# Patient Record
Sex: Female | Born: 1985 | Race: White | Hispanic: No | Smoking: Current some day smoker
Health system: Southern US, Community
[De-identification: ages and names within clinical notes are randomized; demographics above are authoritative.]

## PROBLEM LIST (undated history)

## (undated) ENCOUNTER — Emergency Department (HOSPITAL_COMMUNITY): Discharge status: Against Medical Advice | Payer: MEDICAID

## (undated) DIAGNOSIS — F1910 Other psychoactive substance abuse, uncomplicated: Secondary | ICD-10-CM

## (undated) DIAGNOSIS — R569 Unspecified convulsions: Secondary | ICD-10-CM

## (undated) DIAGNOSIS — F419 Anxiety disorder, unspecified: Secondary | ICD-10-CM

## (undated) DIAGNOSIS — F1120 Opioid dependence, uncomplicated: Secondary | ICD-10-CM

## (undated) DIAGNOSIS — G8929 Other chronic pain: Secondary | ICD-10-CM

## (undated) DIAGNOSIS — F32A Depression, unspecified: Secondary | ICD-10-CM

## (undated) DIAGNOSIS — M1990 Unspecified osteoarthritis, unspecified site: Secondary | ICD-10-CM

## (undated) DIAGNOSIS — F329 Major depressive disorder, single episode, unspecified: Secondary | ICD-10-CM

## (undated) DIAGNOSIS — K219 Gastro-esophageal reflux disease without esophagitis: Secondary | ICD-10-CM

## (undated) HISTORY — PX: MANDIBLE FRACTURE SURGERY: SHX706

## (undated) HISTORY — DX: Unspecified osteoarthritis, unspecified site: M19.90

## (undated) HISTORY — DX: Depression, unspecified: F32.A

## (undated) HISTORY — DX: Unspecified convulsions: R56.9

## (undated) HISTORY — PX: APPENDECTOMY: SHX54

## (undated) HISTORY — DX: Major depressive disorder, single episode, unspecified: F32.9

## (undated) HISTORY — DX: Gastro-esophageal reflux disease without esophagitis: K21.9

---

## 2004-08-09 ENCOUNTER — Emergency Department (HOSPITAL_COMMUNITY): Admission: EM | Admit: 2004-08-09 | Discharge: 2004-08-09 | Payer: Self-pay | Admitting: Emergency Medicine

## 2004-11-01 ENCOUNTER — Emergency Department (HOSPITAL_COMMUNITY): Admission: EM | Admit: 2004-11-01 | Discharge: 2004-11-01 | Payer: Self-pay | Admitting: Emergency Medicine

## 2004-11-07 ENCOUNTER — Emergency Department (HOSPITAL_COMMUNITY): Admission: EM | Admit: 2004-11-07 | Discharge: 2004-11-07 | Payer: Self-pay | Admitting: Emergency Medicine

## 2005-03-24 ENCOUNTER — Emergency Department (HOSPITAL_COMMUNITY): Admission: EM | Admit: 2005-03-24 | Discharge: 2005-03-24 | Payer: Self-pay | Admitting: Emergency Medicine

## 2005-04-01 ENCOUNTER — Emergency Department (HOSPITAL_COMMUNITY): Admission: EM | Admit: 2005-04-01 | Discharge: 2005-04-01 | Payer: Self-pay | Admitting: Emergency Medicine

## 2005-04-02 ENCOUNTER — Emergency Department (HOSPITAL_COMMUNITY): Admission: EM | Admit: 2005-04-02 | Discharge: 2005-04-02 | Payer: Self-pay | Admitting: Emergency Medicine

## 2005-05-23 ENCOUNTER — Emergency Department (HOSPITAL_COMMUNITY): Admission: EM | Admit: 2005-05-23 | Discharge: 2005-05-23 | Payer: Self-pay | Admitting: Emergency Medicine

## 2005-11-25 ENCOUNTER — Inpatient Hospital Stay (HOSPITAL_COMMUNITY): Admission: EM | Admit: 2005-11-25 | Discharge: 2005-11-28 | Payer: Self-pay | Admitting: Emergency Medicine

## 2005-11-29 ENCOUNTER — Emergency Department (HOSPITAL_COMMUNITY): Admission: EM | Admit: 2005-11-29 | Discharge: 2005-11-29 | Payer: Self-pay | Admitting: Emergency Medicine

## 2005-12-03 ENCOUNTER — Emergency Department (HOSPITAL_COMMUNITY): Admission: EM | Admit: 2005-12-03 | Discharge: 2005-12-03 | Payer: Self-pay | Admitting: Emergency Medicine

## 2006-04-21 ENCOUNTER — Emergency Department (HOSPITAL_COMMUNITY): Admission: EM | Admit: 2006-04-21 | Discharge: 2006-04-21 | Payer: Self-pay | Admitting: Emergency Medicine

## 2006-04-27 ENCOUNTER — Emergency Department (HOSPITAL_COMMUNITY): Admission: EM | Admit: 2006-04-27 | Discharge: 2006-04-27 | Payer: Self-pay | Admitting: Emergency Medicine

## 2006-05-05 ENCOUNTER — Emergency Department (HOSPITAL_COMMUNITY): Admission: EM | Admit: 2006-05-05 | Discharge: 2006-05-05 | Payer: Self-pay | Admitting: Emergency Medicine

## 2006-06-06 ENCOUNTER — Emergency Department (HOSPITAL_COMMUNITY): Admission: EM | Admit: 2006-06-06 | Discharge: 2006-06-06 | Payer: Self-pay | Admitting: Emergency Medicine

## 2006-06-27 ENCOUNTER — Emergency Department (HOSPITAL_COMMUNITY): Admission: EM | Admit: 2006-06-27 | Discharge: 2006-06-27 | Payer: Self-pay | Admitting: Family Medicine

## 2006-07-16 ENCOUNTER — Emergency Department (HOSPITAL_COMMUNITY): Admission: EM | Admit: 2006-07-16 | Discharge: 2006-07-17 | Payer: Self-pay | Admitting: Emergency Medicine

## 2006-09-10 ENCOUNTER — Emergency Department (HOSPITAL_COMMUNITY): Admission: EM | Admit: 2006-09-10 | Discharge: 2006-09-10 | Payer: Self-pay | Admitting: Emergency Medicine

## 2007-01-08 ENCOUNTER — Emergency Department (HOSPITAL_COMMUNITY): Admission: EM | Admit: 2007-01-08 | Discharge: 2007-01-08 | Payer: Self-pay | Admitting: Family Medicine

## 2007-02-20 ENCOUNTER — Emergency Department (HOSPITAL_COMMUNITY): Admission: EM | Admit: 2007-02-20 | Discharge: 2007-02-20 | Payer: Self-pay | Admitting: Emergency Medicine

## 2007-02-27 ENCOUNTER — Emergency Department (HOSPITAL_COMMUNITY): Admission: EM | Admit: 2007-02-27 | Discharge: 2007-02-27 | Payer: Self-pay | Admitting: Emergency Medicine

## 2007-03-30 ENCOUNTER — Emergency Department (HOSPITAL_COMMUNITY): Admission: EM | Admit: 2007-03-30 | Discharge: 2007-03-30 | Payer: Self-pay | Admitting: Emergency Medicine

## 2007-06-07 ENCOUNTER — Emergency Department (HOSPITAL_COMMUNITY): Admission: EM | Admit: 2007-06-07 | Discharge: 2007-06-07 | Payer: Self-pay | Admitting: Pediatrics

## 2007-07-19 ENCOUNTER — Emergency Department (HOSPITAL_COMMUNITY): Admission: EM | Admit: 2007-07-19 | Discharge: 2007-07-19 | Payer: Self-pay | Admitting: Emergency Medicine

## 2007-09-08 ENCOUNTER — Inpatient Hospital Stay (HOSPITAL_COMMUNITY): Admission: EM | Admit: 2007-09-08 | Discharge: 2007-09-11 | Payer: Self-pay | Admitting: Family Medicine

## 2007-10-15 ENCOUNTER — Inpatient Hospital Stay (HOSPITAL_COMMUNITY): Admission: AD | Admit: 2007-10-15 | Discharge: 2007-10-15 | Payer: Self-pay | Admitting: Gynecology

## 2008-03-14 ENCOUNTER — Inpatient Hospital Stay (HOSPITAL_COMMUNITY): Admission: AD | Admit: 2008-03-14 | Discharge: 2008-03-14 | Payer: Self-pay | Admitting: Obstetrics & Gynecology

## 2008-06-12 ENCOUNTER — Ambulatory Visit: Payer: Self-pay | Admitting: Obstetrics and Gynecology

## 2008-06-12 ENCOUNTER — Inpatient Hospital Stay (HOSPITAL_COMMUNITY): Admission: AD | Admit: 2008-06-12 | Discharge: 2008-06-13 | Payer: Self-pay | Admitting: Obstetrics & Gynecology

## 2008-06-28 ENCOUNTER — Ambulatory Visit: Payer: Self-pay | Admitting: Obstetrics & Gynecology

## 2008-06-28 ENCOUNTER — Encounter: Payer: Self-pay | Admitting: Obstetrics and Gynecology

## 2008-07-13 ENCOUNTER — Ambulatory Visit: Payer: Self-pay | Admitting: Family Medicine

## 2008-07-27 ENCOUNTER — Ambulatory Visit: Payer: Self-pay | Admitting: Family Medicine

## 2008-07-27 LAB — CONVERTED CEMR LAB
Hemoglobin: 11.7 g/dL — ABNORMAL LOW (ref 12.0–15.0)
Platelets: 248 10*3/uL (ref 150–400)
RBC: 3.78 M/uL — ABNORMAL LOW (ref 3.87–5.11)
RDW: 13.1 % (ref 11.5–15.5)

## 2008-07-28 ENCOUNTER — Ambulatory Visit (HOSPITAL_COMMUNITY): Admission: RE | Admit: 2008-07-28 | Discharge: 2008-07-28 | Payer: Self-pay | Admitting: Family Medicine

## 2008-07-31 ENCOUNTER — Encounter: Admission: RE | Admit: 2008-07-31 | Discharge: 2008-07-31 | Payer: Self-pay | Admitting: Family Medicine

## 2008-08-10 ENCOUNTER — Ambulatory Visit: Payer: Self-pay | Admitting: Obstetrics & Gynecology

## 2008-08-23 ENCOUNTER — Ambulatory Visit: Payer: Self-pay | Admitting: Obstetrics & Gynecology

## 2008-08-31 ENCOUNTER — Ambulatory Visit (HOSPITAL_COMMUNITY): Admission: RE | Admit: 2008-08-31 | Discharge: 2008-08-31 | Payer: Self-pay | Admitting: Obstetrics & Gynecology

## 2008-08-31 ENCOUNTER — Ambulatory Visit: Payer: Self-pay | Admitting: Obstetrics & Gynecology

## 2008-09-12 ENCOUNTER — Ambulatory Visit: Payer: Self-pay | Admitting: Obstetrics & Gynecology

## 2008-09-18 ENCOUNTER — Ambulatory Visit: Payer: Self-pay | Admitting: Obstetrics & Gynecology

## 2008-09-18 ENCOUNTER — Encounter: Payer: Self-pay | Admitting: Obstetrics & Gynecology

## 2008-09-18 LAB — CONVERTED CEMR LAB
Chlamydia, DNA Probe: NEGATIVE
HCV Ab: REACTIVE — AB

## 2008-09-19 ENCOUNTER — Encounter: Payer: Self-pay | Admitting: Obstetrics & Gynecology

## 2008-09-25 ENCOUNTER — Ambulatory Visit: Payer: Self-pay | Admitting: Obstetrics & Gynecology

## 2008-09-25 ENCOUNTER — Encounter: Payer: Self-pay | Admitting: Obstetrics & Gynecology

## 2008-09-25 ENCOUNTER — Ambulatory Visit (HOSPITAL_COMMUNITY): Admission: RE | Admit: 2008-09-25 | Discharge: 2008-09-25 | Payer: Self-pay | Admitting: Obstetrics & Gynecology

## 2008-09-25 LAB — CONVERTED CEMR LAB
ALT: 9 U/L (ref 0–35)
AST: 14 U/L (ref 0–37)
Albumin: 2.9 g/dL — ABNORMAL LOW (ref 3.5–5.2)
Alkaline Phosphatase: 141 U/L — ABNORMAL HIGH (ref 39–117)
BUN: 15 mg/dL (ref 6–23)
CO2: 19 meq/L (ref 19–32)
Calcium: 8.4 mg/dL (ref 8.4–10.5)
Chloride: 110 meq/L (ref 96–112)
Creatinine, Ser: 0.65 mg/dL (ref 0.40–1.20)
Glucose, Bld: 86 mg/dL (ref 70–99)
Potassium: 4.3 meq/L (ref 3.5–5.3)
Sodium: 139 meq/L (ref 135–145)
Total Bilirubin: 0.2 mg/dL — ABNORMAL LOW (ref 0.3–1.2)
Total Protein: 5.6 g/dL — ABNORMAL LOW (ref 6.0–8.3)

## 2008-09-28 ENCOUNTER — Ambulatory Visit: Payer: Self-pay | Admitting: Obstetrics & Gynecology

## 2008-10-02 ENCOUNTER — Ambulatory Visit: Payer: Self-pay | Admitting: Obstetrics & Gynecology

## 2008-10-09 ENCOUNTER — Inpatient Hospital Stay (HOSPITAL_COMMUNITY): Admission: RE | Admit: 2008-10-09 | Discharge: 2008-10-11 | Payer: Self-pay | Admitting: Obstetrics & Gynecology

## 2008-10-09 ENCOUNTER — Ambulatory Visit: Payer: Self-pay | Admitting: Obstetrics & Gynecology

## 2010-02-07 ENCOUNTER — Ambulatory Visit: Payer: Self-pay | Admitting: Gastroenterology

## 2010-04-01 ENCOUNTER — Encounter: Payer: Self-pay | Admitting: *Deleted

## 2010-06-15 LAB — CBC
HCT: 29.4 % — ABNORMAL LOW (ref 36.0–46.0)
HCT: 30.1 % — ABNORMAL LOW (ref 36.0–46.0)
Hemoglobin: 10.3 g/dL — ABNORMAL LOW (ref 12.0–15.0)
Hemoglobin: 10.6 g/dL — ABNORMAL LOW (ref 12.0–15.0)
MCHC: 35.2 g/dL (ref 30.0–36.0)
MCV: 91.3 fL (ref 78.0–100.0)
RDW: 12.7 % (ref 11.5–15.5)
RDW: 12.9 % (ref 11.5–15.5)

## 2010-06-16 LAB — POCT URINALYSIS DIP (DEVICE)
Bilirubin Urine: NEGATIVE
Glucose, UA: NEGATIVE mg/dL
Nitrite: NEGATIVE
Nitrite: NEGATIVE
Protein, ur: 30 mg/dL — AB
Protein, ur: NEGATIVE mg/dL
Protein, ur: NEGATIVE mg/dL
Specific Gravity, Urine: 1.02 (ref 1.005–1.030)
Urobilinogen, UA: 0.2 mg/dL (ref 0.0–1.0)
Urobilinogen, UA: 0.2 mg/dL (ref 0.0–1.0)
pH: 5.5 (ref 5.0–8.0)

## 2010-06-16 LAB — CBC
Platelets: 204 10*3/uL (ref 150–400)
WBC: 7.4 10*3/uL (ref 4.0–10.5)

## 2010-06-17 LAB — POCT URINALYSIS DIP (DEVICE)
Glucose, UA: NEGATIVE mg/dL
Hgb urine dipstick: NEGATIVE
Ketones, ur: NEGATIVE mg/dL
Nitrite: NEGATIVE
Nitrite: NEGATIVE
Protein, ur: 30 mg/dL — AB
Protein, ur: 30 mg/dL — AB
Protein, ur: NEGATIVE mg/dL
Specific Gravity, Urine: 1.02 (ref 1.005–1.030)
Urobilinogen, UA: 0.2 mg/dL (ref 0.0–1.0)
Urobilinogen, UA: 0.2 mg/dL (ref 0.0–1.0)
pH: 5.5 (ref 5.0–8.0)
pH: 6.5 (ref 5.0–8.0)

## 2010-06-18 LAB — POCT URINALYSIS DIP (DEVICE)
Bilirubin Urine: NEGATIVE
Glucose, UA: NEGATIVE mg/dL
Hgb urine dipstick: NEGATIVE
Ketones, ur: NEGATIVE mg/dL
Ketones, ur: NEGATIVE mg/dL
Protein, ur: NEGATIVE mg/dL
Specific Gravity, Urine: 1.025 (ref 1.005–1.030)
Specific Gravity, Urine: 1.015 (ref 1.005–1.030)
Urobilinogen, UA: 0.2 mg/dL (ref 0.0–1.0)
pH: 7 (ref 5.0–8.0)

## 2010-06-19 LAB — COMPREHENSIVE METABOLIC PANEL
ALT: 11 U/L (ref 0–35)
Alkaline Phosphatase: 64 U/L (ref 39–117)
BUN: 5 mg/dL — ABNORMAL LOW (ref 6–23)
CO2: 25 mEq/L (ref 19–32)
Calcium: 8.2 mg/dL — ABNORMAL LOW (ref 8.4–10.5)
GFR calc non Af Amer: >60 mL/min (ref >60)
Glucose, Bld: 102 mg/dL — ABNORMAL HIGH (ref 70–99)
Potassium: 2.7 mEq/L — CL (ref 3.5–5.1)
Sodium: 133 mEq/L — ABNORMAL LOW (ref 135–145)
Total Protein: 5.2 g/dL — ABNORMAL LOW (ref 6.0–8.3)

## 2010-06-19 LAB — DIFFERENTIAL
Basophils Absolute: 0 10*3/uL (ref 0.0–0.1)
Basophils Relative: 0 % (ref 0–1)
Eosinophils Absolute: 0 10*3/uL (ref 0.0–0.7)
Lymphs Abs: 0.3 10*3/uL — ABNORMAL LOW (ref 0.7–4.0)
Neutro Abs: 12 10*3/uL — ABNORMAL HIGH (ref 1.7–7.7)

## 2010-06-19 LAB — RAPID URINE DRUG SCREEN, HOSP PERFORMED
Amphetamines: NOT DETECTED
Barbiturates: NOT DETECTED
Benzodiazepines: NOT DETECTED
Cocaine: POSITIVE — AB

## 2010-06-19 LAB — URINALYSIS, ROUTINE W REFLEX MICROSCOPIC
Bilirubin Urine: NEGATIVE
Glucose, UA: NEGATIVE mg/dL
Specific Gravity, Urine: 1.01 (ref 1.005–1.030)
Urobilinogen, UA: 0.2 mg/dL (ref 0.0–1.0)
pH: >9 — ABNORMAL HIGH (ref 5.0–8.0)

## 2010-06-19 LAB — CBC
HCT: 28 % — ABNORMAL LOW (ref 36.0–46.0)
Hemoglobin: 9.7 g/dL — ABNORMAL LOW (ref 12.0–15.0)
MCHC: 34.5 g/dL (ref 30.0–36.0)
RBC: 3.07 MIL/uL — ABNORMAL LOW (ref 3.87–5.11)
RDW: 12.4 % (ref 11.5–15.5)

## 2010-06-19 LAB — POCT URINALYSIS DIP (DEVICE)
Bilirubin Urine: NEGATIVE
Hgb urine dipstick: NEGATIVE
Nitrite: NEGATIVE
Specific Gravity, Urine: 1.02 (ref 1.005–1.030)
pH: 7 (ref 5.0–8.0)

## 2010-06-19 LAB — HEPATITIS B SURFACE ANTIGEN: Hepatitis B Surface Ag: NEGATIVE

## 2010-06-19 LAB — URINE MICROSCOPIC-ADD ON

## 2010-06-19 LAB — ABO/RH: ABO/RH(D): O POS

## 2010-06-19 LAB — TYPE AND SCREEN: ABO/RH(D): O POS

## 2010-06-19 LAB — URINE CULTURE

## 2010-06-19 LAB — HIV ANTIBODY (ROUTINE TESTING W REFLEX): HIV: NONREACTIVE

## 2010-06-24 LAB — URINALYSIS, ROUTINE W REFLEX MICROSCOPIC
Bilirubin Urine: NEGATIVE
Glucose, UA: NEGATIVE mg/dL
Hgb urine dipstick: NEGATIVE
Ketones, ur: NEGATIVE mg/dL
Protein, ur: NEGATIVE mg/dL
Urobilinogen, UA: 0.2 mg/dL (ref 0.0–1.0)

## 2010-07-23 NOTE — Op Note (Signed)
NAMEKARALINA, TIFT                ACCOUNT NO.:  [id number]   MEDICAL RECORD NO.:  [id number]          PATIENT TYPE:  INP   LOCATION:  5007                         FACILITY:  [facility]   PHYSICIAN:  Dionne Ano. Gramig III, M.D.DATE OF BIRTH:  12/07/1985   DATE OF PROCEDURE:  09/07/2007  DATE OF DISCHARGE:                               OPERATIVE REPORT   PREOPERATIVE DIAGNOSIS:  1. Purulent  flexor tenosynovitis right middle finger with positive      Kanavel's sign.  2. History of cocaine dependence.   POSTOPERATIVE DIAGNOSIS:  1. Purulent flexor tenosynovitis right middle finger with positive      Kanavel's sign.  2. History of cocaine dependence.   PROCEDURE:  1. Incision and drainage of purulent flexor tenosynovitis.  2. Extensive flexor tenosynovectomy about the flexor digitorum      profundus and flexor digitorum superficialis tendons.   SURGEON:  Dionne Ano. Jeananne Pea, M.D.   ASSISTANT:  None.   COMPLICATIONS:  None.   ANESTHESIA:  General.   SPECIMENS:  None.   CULTURES:  Both tissue and fluid cultures were taken   TOURNIQUET TIME:  Less than an hour.   INDICATIONS FOR PROCEDURE:  This patient is a 25 year old female with a  7-year history of cocaine dependence.  She is noted to have 4 days ago  shot of cocaine via injection into the middle finger.  She states she  has injected her fingers before without problems but at this time she  developed infection signs afterwards.  She presented to the emergency  room with significant swelling, pain, and disarray of the extremity.  Her right middle finger and extremity has cellulitis and ascending  lymphangitis with positive Kanavel's sign.  I have counseled her  regarding risks and benefits of the surgery, and she desires to proceed.  I have discussed with her that we would try our best to salvage her  hand, although the prognosis is guarded.   OPERATION IN DETAIL:  The patient was seen by myself and anesthesia,  taken to  operative suite and underwent a smooth induction of general  anesthesia, permit was signed, time-out was called, and she was then  prepped and draped in the usual sterile fashion with Betadine scrub and  paint.  Following this, a curvilinear incision was made with modified  Brunner in nature after sterile prep was secured about the right hand.  The wound was opened up merely and I encountered large amount of  purulence where A3 pulley was disrupted secondary to the infectious  process.  I carefully dissected the neurovascular bundles.  There were  intact.  The fluid specimen was taken without difficulty at this time,  aerobic and anaerobic.  Following this, I then made a counter incision  just proximal to the A1 pulley.  This was oblique in line with her  palmar crease.  Dissection was carried down, palmar fascial bands were  incised, neurovascular band was protected, A1 pulley was identified, and  the proximal portion was released.  I then performed an extensive  tenosynovectomy of the FDP and FDS tendons, a large  amount of  tenosynovium was removed without difficulty and there no complicating  features with this.  Following this, I then performed cautious and  copious irrigation of the wound, pediatric feeding catheter was placed  in the wound.  Tissue cultures were taken at this time.  The patient  tolerated this well.   I had discussed with the patient the issues preoperatively and  certainly, she did have up purulent flexor tenosynovitis.  We performed  I&D of the area and an exuberant tenosynovectomy of the FDP and FDS  tendons was performed.  She had the drain sewn in place in the form of  pediatric feeding catheter, this will be hooked up to continuous  drainage and we will plan for relook/repeat I&D, the Friday morning  after 48 hours of IV antibiotics and lavage.  I have discussed these  issues with her at length and her boyfriend.   I do feel that unfortunately this patient  has some very significant  obstacles facing her.  Unfortunately, her direct dependence is a major  concern and problem and will likely continued to be a very tremendous  obstacle for her for the future.  We have discussed this with her, and  she clearly understands this.      Dionne Ano. Everlene Other, M.D.     Nash Mantis  D:  09/08/2007  T:  09/08/2007  Job:  [id number]

## 2010-07-23 NOTE — Op Note (Signed)
NAMEBRITINEY, BLAHNIK                ACCOUNT NO.:  [id number]   MEDICAL RECORD NO.:  [id number]          PATIENT TYPE:  INP   LOCATION:  9309                          FACILITY:  [facility]   PHYSICIAN:  Allie Bossier, MD        DATE OF BIRTH:  1985-10-25   DATE OF PROCEDURE:  10/09/2008  DATE OF DISCHARGE:                               OPERATIVE REPORT   PREOPERATIVE DIAGNOSES:  Previous cesarean section currently 58 weeks'  estimated gestational age.  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   POSTOPERATIVE DIAGNOSES:  Previous cesarean section currently 59 weeks'  estimated gestational age.  The patient is also on methadone maintenance  after heroin addition.   PROCEDURE:  Repeat low-transverse cesarean section.   SURGEON:  Allie Bossier, MD   ANESTHESIA:  Spinal, Jean Rosenthal, MD   COMPLICATIONS:  None.   ESTIMATED BLOOD LOSS:  600 mL.   SPECIMENS:  Cord blood.   FINDINGS:  1. Living female infant with Apgars of 8 and 8 at 1 and 5 minutes,      weight is pending as of the time of this dictation.  2. Intact placenta with 3-vessel cord.  There was a true knot noted in      the umbilical port.  3. Normal-appearing adnexa.   DETAILS OF PROCEDURE AND FINDINGS:  The risks, benefits, and  alternatives of the surgery explained, understood, and accepted.  She  does request a repeat low-transverse cesarean section.  She has no  questions.  SCDs were placed.  IV ampicillin was given and she was taken  to the operating room.  Spinal anesthesia was applied without  complication.  She was placed in the dorsal supine position with left  lateral tilt.  Her abdomen and vagina were prepped and draped in the  usual sterile fashion.  A Foley catheter was placed which drained clear  urine throughout the case.  Adequate anesthesia was assured.  She had  requested that I used her previous C-section scar.  However, the scar  was on her mons, and I explained that I was not  comfortable going that  low, but she did agree that I could make a new incision while stayed  below her bikini line, I happily acquiesced.  Incision was made  approximately 2 cm above the symphysis pubis, but still in the below of  her pubic hair line.  Incision was carried down through the subcutaneous  tissue of the fascia.  Fascia was scored in the midline.  Fascial  incision was extended bilaterally and curved slightly upwards.  Using  the Bovie, I transected the middle 25% of the rectus muscles in the  midline.  Excellent hemostasis was noted.  I then pulled the rectus  muscle to the side field adequate exposure.  Entering the peritoneum  with hemostats and the peritoneal incision was extended bilaterally with  the Bovie taking care to avoid the bladder.  A bladder blade was placed.  There was one adhesion of the bladder to midway up on the uterus on the  right-hand side.  I took this down with the Bovie.  The bladder blade  was placed and a transverse incision was made on the moderately well-  developed lower uterine segment.  Amniotomy was performed with hemostats  and clear fluid was noted.  The uterine incision was extended  bilaterally and curved slightly upwards bandage scissors.  The baby was  delivered from a vertex presentation.  Mouth and nostrils were suctioned  prior to delivery of the shoulders.  Cord was clamped and cut and the  baby was transferred to the pediatrician for routine care.  Apgars are  listed above.  Cord blood sample was obtained.  The patient was taken  out of her left lateral tilt and the placenta was extracted with  traction.  The uterus was left in situ.  The uterine interior was  cleaned with a dry lap sponge.  A bladder blade was placed and the  uterine incision was closed with 2 layers of 2-0 Vicryl running locking  suture.  The second layer imbricating the first.  Excellent hemostasis  was noted.  By tilting the uterus to each side, I was able to  visualize  the adnexa, they appeared normal.  The uterine incision was again  inspected and noted to be hemostatic as were the rectus muscles and  rectus fascia.  Rectus fascia was closed with a 0 Vicryl running  nonlocking suture.  No defects palpable.  The subcutaneous tissue was  irrigated, cleaned, and dried.  It was then infiltrated with 30 mL of  0.5% Marcaine.  A subcuticular closure was done with 3-0 Vicryl suture.  Instruments, sponge, and needle counts were correct.  She was taken to  recovery room in stable condition.      Allie Bossier, MD  Electronically Signed     MCD/MEDQ  D:  10/09/2008  T:  10/10/2008  Job:  [id number]

## 2010-07-23 NOTE — Op Note (Signed)
NAMEAZARAH, DACY                ACCOUNT NO.:  [id number]   MEDICAL RECORD NO.:  [id number]          PATIENT TYPE:  INP   LOCATION:  5007                         FACILITY:  [facility]   PHYSICIAN:  Dionne Ano. Gramig III, M.D.DATE OF BIRTH:  Dec 06, 1985   DATE OF PROCEDURE:  DATE OF DISCHARGE:                               OPERATIVE REPORT   PREOPERATIVE DIAGNOSIS:  Status post pyo flexor tenosynovitis.  The  patient presents for repeat I&D.   POSTOPERATIVE DIAGNOSIS:  Status post pyo flexor tenosynovitis.  The  patient presents for repeat I&D.   PROCEDURES:  1. Incision and drainage skin, subcutaneous tissue, muscle and tendon,      right hand.  2. Flexor tenosynovectomy about the flexor digitorum profundus and      flexor digitorum superficialis, right hand.   SURGEON:  Dionne Ano. Rodnesha Pea, MD   COMPLICATIONS:  None.   ANESTHESIA:  General.   TOURNIQUET TIME:  Zero.   INDICATIONS FOR PROCEDURE:  This patient is 25 year old female who  presents for the above-mentioned diagnosis.  I have counseled her  regarding the risks and benefits of the surgery including risk of  infection, bleeding anesthesia, damage to normal structures, and failure  of surgery to accomplish its intended goals of relieving symptoms and  restoring function.  With this mind, she desires to proceed.  All  questions have been encouraged and answered preoperatively.   OPERATIVE PROCEDURE:  The patient was given Omnicef and anesthesia.  Permit was signed.  She was counseled extensively.  A time-out was  called.  She underwent a general anesthesia under the direction of Dr.  Laverle Hobby.  She was prepped and draped in usual sterile fashion.  Her parameters looked much improved.  I very carefully removed her  pediatric feeding catheter in the finger and noted no reaccumulation of  purulence.  Following this, I then performed a sterile prep and drape  and I then performed I&D of skin, subcutaneous tissue,  muscle, and  tendon with greater than 3 L of saline.  I performed a thorough through-  and-through irrigation utilizing a pediatric feeding catheter through  the flexor tendon sheath.  Following this, I performed a flexor  digitorum profundus and flexor digitorum superficialis tenosynovectomy.  This was removal of a large amount of tenosynovium.  The patient  tolerated this quite well.  There were no complicating features.  Following this, I loosely closed the wound over vessel loops.  Overall,  these parameters looked much improved and sterile dressing followed  by volar plaster splint was applied.  She will be monitored.  We will  continue her vancomycin.  She is growing out Staphylococcus and we will  discontinue her Zosyn at this juncture.  I have discussed with the  patient her upper extremity predicament.  We will continue a very  aggressive approach to her serious infection.      Dionne Ano. Everlene Other, M.D.     Nash Mantis  D:  09/10/2007  T:  09/10/2007  Job:  [id number]

## 2010-07-23 NOTE — Discharge Summary (Signed)
[name], [name]                ACCOUNT NO.:  [id number]   MEDICAL RECORD NO.:  [id number]          PATIENT TYPE:  INP   LOCATION:  9309                          FACILITY[facility]  [facility]   PHYSICIAN:  Allie Bossier, MD        DATE OF BIRTH:  12/28/1985   DATE OF ADMISSION:  10/09/2008  DATE OF DISCHARGE:  10/11/2008                               DISCHARGE SUMMARY   DISCHARGE DIAGNOSES:  1. History of previous cesarean section.  2. Methadone maintenance for heroine addiction.   DISCHARGE MEDICATIONS:  1. Methadone.  2. Percocet 5/325 mg one tablet p.o. q.4 hours p.r.n. postpartum pain      dispense #20 tablets.   CONSULTATIONS:  None.   PROCEDURE:  Repeat low transverse cesarean section October 09, 2008.   LABORATORY DATA:  1. Type and screen October 09, 2008; O positive.  2. CBC October 10, 2008; white count 7.7, hemoglobin 10.6, hematocrit      30.1, platelet count 161.  3. CBC October 11, 2008; white count 6.1, hemoglobin 10.3, hematocrit      29.4, platelet count 182.   HOSPITAL COURSE:  This is a 25 year old gravida 3, para 0-1-1-1 coming  in for elective low transverse cesarean section for term pregnancy.  1. Pregnancy.  A live female infant was born via cesarean section with      Apgars of 8 and 8 at 1 and 5 minutes, intact placenta with three-      vessel cord was removed with the note of a true knot noted at the      umbilical cord.  The cord was clamped and cut and the baby was      transferred to pediatrician for routine care.  Cord blood sample      was obtained and sent.  Uterine incision was closed with 2-0 Vicryl      running locked suture.  Rectus fascia closed with 0 Vicryl running      nonlocking suture.  Subcuticular closure with 3-0 Vicryl.  The      patient was taken to recovery room in stable condition and baby      sent to NICU.  Mom is bottle feeding with condoms for preferred      contraception.  Female baby therefore no circumcision.  Hemoglobin      stable at 10.3  with no need for iron supplementation.  Mom is O      positive, antibody screen negative, rubella immune, hemoglobin      surface antigen negative, RPR nonreactive, GC and Chlamydia      negative.  HIV nonreactive.  Spinal anesthesia, EBL 600 mL.  2. Chronic opioid addiction.  The patient was continued on her home      maintenance opioid medication which included methadone.  No new      pain medication regimens were added during the hospitalization      apart from Percocet 5/325 mg one tablet p.o. q.4 hours p.r.n. for      postpartum pain x4-5 days worth of Percocet medication.  The  patient was discharged on her preadmission methadone maintenance      medication.   DISCHARGE INSTRUCTIONS:  The patient was discharged with a normal diet,  routine postoperative wound care with normal activity.   FOLLOWUP:  [facility]  6 weeks for routine postoperative and postpartum care.   CONDITION ON DISCHARGE:  The patient was discharged to home in stable  medical condition.      Doree Albee, MD      Allie Bossier, MD  Electronically Signed    SN/MEDQ  D:  10/30/2008  T:  10/31/2008  Job:  [phone]

## 2010-07-23 NOTE — Discharge Summary (Signed)
[name], BEISSEL                ACCOUNT NO.:  [id number]   MEDICAL RECORD NO.:  [id number]          PATIENT TYPE:  INP   LOCATION:  5007                         FACILITY:  [facility]   PHYSICIAN:  Dionne Ano. Gramig III, M.D.DATE OF BIRTH:  1985/09/09   DATE OF ADMISSION:  09/07/2007  DATE OF DISCHARGE:  09/11/2007                               DISCHARGE SUMMARY   DIAGNOSES:  1. Purulent flexor tenosynovitis of right middle finger, status post 2      surgical I&D.  2. Drug abuse.   HOSPITAL COURSE:  This patient was admitted on September 07, 2007, with  purulent flexor tenosynovitis.  She was taken immediately to the  operative suite and underwent I&D.  The patient had purulent flexor  tenosynovitis about her right middle finger flexor sheath.  A continuous  irrigation protocol was ensued.  She was placed on vancomycin and Zosyn.  Her cultures ultimately grew out Staphylococcus, which was sensitive to  doxycycline.  Following a second I&D on September 10, 2007, she had much  improved characteristics and underwent thorough and copious lavage and  tenosynovectomy.  On September 11, 2007, the patient was much improved.  We  placed her back on methadone and this appeared to control her pain  rather well with some Percocet used for breakthrough issues.  She was on  Dilaudid, Percocet, Robaxin, and Xanax during her hospital course.  On  September 11, 2007, she had ability to tolerate food nicely.  She had normal  voiding capabilities.  No signs of infection, dystrophy, or vascular  compromise other than the infection issues that we have dealt with  throughout her hospital stay in the right middle finger.  Her heart was  regular rate.  Abdomen was nontender.  Chest was clear and her lower  extremity examination was benign.  She was discharged home on  doxycycline 100 mg 1 p.o. b.i.d., Percocet 5 mg 1 p.o. b.i.d., Keflex  500 mg 1 p.o. q.i.d., and Xanax 0.5 to 1 mg 1 p.o. q.8 hours p.r.n.  anxiety.  She will also  to continue her [facility]one [facility] and I have encouraged her to call [facility] at [phone]  for outpatient followup.  The patient's vancomycin has been discontinued  and she should tolerate doxycycline regime.   I have discussed with her the dressing care and other measures in great  detail.  Dressing supplies were dispensed.  She will return to my office  on Monday at 11 a.m. for wound check to ensure that her finger is  stable.  I have discussed with the patient all issues.  She is very  comfortable with the plan and very serious about trying to avoid relapse  in terms of her drug usage.  We have done everything within our power to  try and help this young female of 25 years of age who has rather a long  history of drug dependence.  At the present time, she is quite stable,  alert, and oriented, and feels very well about her sobriety, and  hopefully, we will continue this.  FINAL DIAGNOSIS:  1. Purulent flexor tenosynovitis of right middle finger, treated with      2 surgical incision and drainages.  2. History of substance abuse.      Dionne Ano. Everlene Other, M.D.     Nash Mantis  D:  09/11/2007  T:  09/11/2007  Job:  [id number]

## 2010-07-23 NOTE — Discharge Summary (Signed)
[name], [name]                 ACCOUNT NO.:  [id number]   MEDICAL RECORD NO.:  [id number]          PATIENT TYPE:  INP   LOCATION:  9307                          FACILITY:  [facility]   PHYSICIAN:  Scheryl Darter, MD       DATE OF BIRTH:  [birth year]   DATE OF ADMISSION:  06/12/2008  DATE OF DISCHARGE:  06/13/2008                               DISCHARGE SUMMARY   DISCHARGE DIAGNOSES:  1. Intrauterine pregnancy at 24-5/7th weeks gestational age.  2. Pyelonephritis.  3. No prenatal care.  4. Substance abuse.  5. Methadone dependency.   REASON FOR HOSPITALIZATION:  Ms. [name] is 25 year old gravida 3,  para 1-0-1-1 at 24-4/7th weeks on admission who presented with fevers,  dysuria, and right flank pain.  On evaluation, she was found to have a  urinalysis, which revealed leukocyte esterase with 21-50 white blood  cells.  She was admitted for observation on IV antibiotics.   HOSPITAL COURSE:  The patient was admitted and received IV antibiotics.  She had a fever of 102.1 at 1800 on April 5, since then she has been  completely afebrile.  Of note, she had a urine drug screen that revealed  marijuana and cocaine.  The [facility] is [facility] and the  [facility] will be notified with those results.  The patient is  requesting discharge on the evening of June 13, 2008 and she has been  afebrile for 24 hours.  She will get a final dose of Rocephin before  leaving and will be discharged home.   FINDINGS:  As per hospital course.   CONDITION ON DISCHARGE:  Good.   MEDICATIONS:  Keflex 500 mg four times daily for additional 12 more  days, prenatal vitamins.   INSTRUCTIONS FOR PATIENT:  The patient was given instructions on use for  medications.  Additionally, the patient was instructed to call [facility] to set up her initial intake appointment for her prenatal  care.      Odie Sera, DO      Scheryl Darter, MD  Electronically Signed    MC/MEDQ   D:  06/13/2008  T:  06/14/2008  Job:  [id number]

## 2010-07-26 NOTE — H&P (Signed)
[name], [name]                ACCOUNT NO.:  [id number]   MEDICAL RECORD NO.:  [id number]          PATIENT TYPE:  INP   LOCATION:  0101                         FACILITY:  [facility]   PHYSICIAN:  Mobolaji B. Bakare, M.D.DATE OF BIRTH:  24-Aug-1985   DATE OF ADMISSION:  11/25/2005  DATE OF DISCHARGE:                                HISTORY & PHYSICAL   PRIMARY CARE PHYSICIAN:  Unassigned.   The patient attends [facility] for methadone program.   CHIEF COMPLAINT:  Withdrawal from methadone.   HISTORY OF PRESENTING COMPLAINT:  Ms. [name] is an unfortunate 25 year old  Caucasian female who has a history of heroin abuse.  She stated that she  started a methadone program in February 2007.  Prior to that, she was using  heroin for about 4-6 months.  The patient goes in to obtain methadone daily,  dose of 45 mg, which she is usually observed, except on Sunday.  She has to  pay $12 each time to get the methadone.  Unfortunately, she could not afford  further payment, and in the last 4 days she has not been using the  methadone.  She developed symptoms including shaking, jumpiness, diarrhea,  nausea, vomiting, abdominal cramps, lacrimation, and rhinorrhea.  These  symptoms have since gotten worse.  She decided to come to the hospital  today.  In the interim, the patient has been using alcohol to dampen the  effects of these symptoms, but to no avail.   REVIEW OF SYSTEMS:  She denies fever, cough, chest pain.  There is no change  in her mental status.  She feels shaky, but no fever.   PAST SURGICAL HISTORY:  Pharmacist, community, with injury to her lower jaw.   MEDICATIONS:  Methadone 45 mg daily.   ALLERGIES:  No known drug allergies.   FAMILY HISTORY:  The patient's father shot himself at a young age.  The  patient's mom is alive, but she does not know where her mom is.  Her mom is  always in and out of jail.   SOCIAL HISTORY:  The patient works as a Copywriter, advertising.  She lives with her  boyfriend, who is a cocaine addict.  She smokes cigarettes 1 pack per day.  She has been smoking for 1 year.  She drinks alcohol, and more especially in  the last 4 days.  She has increased her volume of alcohol intake to dampen  the effect of her symptoms.  The patient drinks beer about 3 times a day and  also hard liquor.   PHYSICAL EXAMINATION:  INITIAL VITAL SIGNS ON ARRIVAL IN THE EMERGENCY ROOM:  Temperature 97.6, blood pressure 138/58, pulse 111, respiratory rate 20, O2  saturation 98%.  GENERAL:  The patient is uncomfortable, not in respiratory distress.  HEENT:  Normocephalic, atraumatic head.  Pupils 6 mm, equal, round, reactive  to light.  Mucous membranes moist.  The patient is noted to be yawning  during the examination.  There is no obvious rhinorrhea.  NECK:  No elevated JVD.  No carotid bruits.  LUNGS:  Clear clinically to auscultation.  CVS:  S1, S2, tachycardic.  ABDOMEN:  Soft, diffuse tenderness.  No rebound.  No guarding.  Bowel sounds  present.  No palpable organomegaly.  EXTREMITIES:  No pedal edema, calf tenderness.  Dorsalis pedis 2+  bilaterally.   INITIAL LABORATORY DATA:  Urine pregnancy test negative.  Urine drug screen  negative for amphetamines, barbiturates, benzodiazepines, cocaine.  Opiates  negative. Tetrahydrocannabinol negative.  Urinalysis unremarkable. Alcohol  level 34.  Lipase 48.  Sodium 142, potassium 3.2, chloride 111, CO2 21,  glucose 101, BUN 14, creatinine 0.6, bilirubin 0.5, alkaline phosphatase 57,  AST 20, ALT 16, total protein 7, albumin 4, calcium 8.9.  White cells 5.9,  hemoglobin 12.7, hematocrit 36, platelets 310, neutrophils 57, lymphocytes  32.   ASSESSMENT AND PLAN:  1. Methadone withdrawal.  2. Nausea and vomiting secondary to #1.  3. Diarrhea secondary to #1.  4. Alcohol abuse.  5. Tobacco abuse.  6. Hypokalemia.   PLAN:  1. Clonidine 0.05 mg p.o. x1 now, then clonidine patch 0.1 mg/hour.  2. Will start Ativan  withdrawal protocol (patient also has significant      alcohol use).  3. IV fluids, normal saline, plus 20 mEq of KCl at 150 cc/hour.  4. Tylenol 650 mg p.o. q.4 h. p.r.n. pain.  5. Imodium 1 p.o. q.1-2 h. p.r.n. for diarrhea.  6. Phenergan 12.5 mg IV q.4 h. p.r.n. for nausea and vomiting.  7. Nicotine patch 21 mg daily.  8. Potassium chloride 40 mEq p.o. x1 now, and supplement also in IV fluid.  9. HIV serology. Patient agrees to screening.   Will admit patient to step-down unit.  Behavioral Health consult/psychiatry  consult.      Mobolaji B. Corky Downs, M.D.  Electronically Signed     MBB/MEDQ  D:  11/25/2005  T:  11/25/2005  Job:  [id number]

## 2010-12-02 LAB — POCT CARDIAC MARKERS: Myoglobin, poc: 25.4

## 2010-12-02 LAB — RAPID URINE DRUG SCREEN, HOSP PERFORMED
Amphetamines: NOT DETECTED
Barbiturates: NOT DETECTED
Benzodiazepines: NOT DETECTED
Cocaine: POSITIVE — AB
Opiates: NOT DETECTED
Tetrahydrocannabinol: NOT DETECTED

## 2010-12-02 LAB — POCT I-STAT, CHEM 8
BUN: 12
Calcium, Ion: 1.16
Creatinine, Ser: 0.9
Glucose, Bld: 97
Sodium: 139
TCO2: 22

## 2010-12-05 LAB — BASIC METABOLIC PANEL
BUN: 7
CO2: 29
Calcium: 9.5
Calcium: 8.9
GFR calc Af Amer: >60
GFR calc non Af Amer: >60
GFR calc non Af Amer: >60
Potassium: 4.5
Potassium: 3.8
Sodium: 138

## 2010-12-05 LAB — PROTIME-INR
INR: 0.9
Prothrombin Time: 12

## 2010-12-05 LAB — DIFFERENTIAL
Basophils Relative: 0
Eosinophils Absolute: 0.1
Neutrophils Relative %: 69

## 2010-12-05 LAB — ANAEROBIC CULTURE

## 2010-12-05 LAB — TISSUE CULTURE

## 2010-12-05 LAB — CBC
HCT: 31.4 — ABNORMAL LOW
MCHC: 35.2
MCV: 88.3
Platelets: 348
Platelets: 298
WBC: 8.6
WBC: 4.2

## 2010-12-05 LAB — GRAM STAIN

## 2010-12-05 LAB — CULTURE, ROUTINE-ABSCESS

## 2010-12-06 LAB — POCT PREGNANCY, URINE: Preg Test, Ur: NEGATIVE

## 2010-12-06 LAB — URINALYSIS, ROUTINE W REFLEX MICROSCOPIC
Bilirubin Urine: NEGATIVE
Nitrite: NEGATIVE
Specific Gravity, Urine: 1.015
pH: 8.5 — ABNORMAL HIGH

## 2010-12-06 LAB — GC/CHLAMYDIA PROBE AMP, GENITAL
Chlamydia, DNA Probe: NEGATIVE
GC Probe Amp, Genital: NEGATIVE

## 2010-12-17 ENCOUNTER — Encounter: Payer: Self-pay | Admitting: Obstetrics & Gynecology

## 2010-12-18 LAB — POCT URINALYSIS DIP (DEVICE)
Bilirubin Urine: NEGATIVE
Glucose, UA: NEGATIVE
Ketones, ur: NEGATIVE
Specific Gravity, Urine: >=1.03
pH: 5.5

## 2010-12-18 LAB — POCT PREGNANCY, URINE: Operator id: 116391

## 2011-01-14 ENCOUNTER — Encounter: Payer: Self-pay | Admitting: Obstetrics & Gynecology

## 2011-02-04 ENCOUNTER — Other Ambulatory Visit (HOSPITAL_COMMUNITY)
Admission: RE | Admit: 2011-02-04 | Discharge: 2011-02-04 | Disposition: A | Discharge status: Home / Self Care | Payer: Medicaid Other | Source: Ambulatory Visit | Attending: Obstetrics & Gynecology | Admitting: Obstetrics & Gynecology

## 2011-02-04 ENCOUNTER — Ambulatory Visit (INDEPENDENT_AMBULATORY_CARE_PROVIDER_SITE_OTHER): Payer: Medicaid Other | Admitting: Obstetrics & Gynecology

## 2011-02-04 ENCOUNTER — Encounter: Payer: Self-pay | Admitting: Obstetrics & Gynecology

## 2011-02-04 VITALS — BP 105/69 | HR 69 | Temp 97.4°F | Ht 63.0 in | Wt 156.0 lb

## 2011-02-04 DIAGNOSIS — Z01419 Encounter for gynecological examination (general) (routine) without abnormal findings: Secondary | ICD-10-CM

## 2011-02-04 DIAGNOSIS — N926 Irregular menstruation, unspecified: Secondary | ICD-10-CM

## 2011-02-04 DIAGNOSIS — Z0000 Encounter for general adult medical examination without abnormal findings: Secondary | ICD-10-CM

## 2011-02-04 DIAGNOSIS — Z113 Encounter for screening for infections with a predominantly sexual mode of transmission: Secondary | ICD-10-CM | POA: Insufficient documentation

## 2011-02-04 LAB — POCT URINE PREGNANCY: Preg Test, Ur: NEGATIVE

## 2011-02-04 NOTE — Progress Notes (Signed)
Subjective:    [name] is a 25 y.o. female who presents for an annual exam. She wants testing for STDs as her husband has been unfaithful. She also feels pregnant and her period is 2 weeks late. The patient is not currently sexually active. GYN screening history: last pap: was normal. The patient wears seatbelts: yes. The patient participates in regular exercise: yes. Has the patient ever been transfused or tattooed?: yes. The patient reports that there is not domestic violence in her life.   Menstrual History: OB History    Grav Para Term Preterm Abortions TAB SAB Ect Mult Living   3 2   1 1           Menarche age: 36 Patient's last menstrual period was 12/23/2010.    The following portions of the patient's history were reviewed and updated as appropriate: allergies, current medications, past family history, past medical history, past social history, past surgical history and problem list.  Review of Systems A comprehensive review of systems was negative.  She has lost 100# since her pregnancy 2 years ago.  She would like to restart OCPs- she prefers [facility]   Objective:    BP 105/69  Pulse 69  Temp(Src) 97.4 F (36.3 C) (Oral)  Ht 5\' 3"  (1.6 m)  Wt 156 lb (70.761 kg)  BMI 27.63 kg/m2  LMP 12/23/2010  General Appearance:    Alert, cooperative, no distress, appears stated age  Head:    Normocephalic, without obvious abnormality, atraumatic  Eyes:    PERRL, conjunctiva/corneas clear, EOM's intact, fundi    benign, both eyes  Ears:    Normal TM's and external ear canals, both ears  Nose:   Nares normal, septum midline, mucosa normal, no drainage    or sinus tenderness  Throat:   Lips, mucosa, and tongue normal; teeth and gums normal  Neck:   Supple, symmetrical, trachea midline, no adenopathy;    thyroid:  no enlargement/tenderness/nodules; no carotid   bruit or JVD  Back:     Symmetric, no curvature, ROM normal, no CVA tenderness  Lungs:     Clear to auscultation  bilaterally, respirations unlabored  Chest Wall:    No tenderness or deformity   Heart:    Regular rate and rhythm, S1 and S2 normal, no murmur, rub   or gallop  Breast Exam:    No tenderness, masses, or nipple abnormality  Abdomen:     Soft, non-tender, bowel sounds active all four quadrants,    no masses, no organomegaly  Genitalia:    Normal female without lesion, discharge or tenderness, NSSA, NT, no adnexal masses     Extremities:   Extremities normal, atraumatic, no cyanosis or edema  Pulses:   2+ and symmetric all extremities  Skin:   Skin color, texture, turgor normal, no rashes or lesions  Lymph nodes:   Cervical, supraclavicular, and axillary nodes normal  Neurologic:   CNII-XII intact, normal strength, sensation and reflexes    throughout  .    Assessment:    Healthy female exam.  Birth control   Plan:     Await pap smear results.  Samples and script for [facility] STD testing as she requests

## 2011-02-04 NOTE — Progress Notes (Signed)
Addended by: Allie Bossier on: 02/04/2011 03:05 PM   Modules accepted: Orders

## 2011-02-05 LAB — TSH: TSH: 1.399 u[IU]/mL (ref 0.350–4.500)

## 2011-02-05 LAB — HIV-1 RNA, QUALITATIVE, TMA

## 2011-02-05 LAB — HEPATITIS B SURFACE ANTIGEN: Hepatitis B Surface Ag: NEGATIVE

## 2011-02-10 LAB — HEPATITIS C RNA QUANTITATIVE

## 2012-09-11 ENCOUNTER — Emergency Department (HOSPITAL_COMMUNITY): Payer: Self-pay

## 2012-09-11 ENCOUNTER — Emergency Department (HOSPITAL_COMMUNITY)
Admission: EM | Admit: 2012-09-11 | Discharge: 2012-09-11 | Disposition: A | Discharge status: Home / Self Care | Payer: Medicaid Other | Attending: Emergency Medicine | Admitting: Emergency Medicine

## 2012-09-11 ENCOUNTER — Encounter (HOSPITAL_COMMUNITY): Payer: Self-pay | Admitting: Emergency Medicine

## 2012-09-11 ENCOUNTER — Emergency Department (HOSPITAL_COMMUNITY): Admit: 2012-09-11 | Payer: Self-pay | Source: Home / Self Care

## 2012-09-11 ENCOUNTER — Emergency Department (HOSPITAL_COMMUNITY): Payer: Medicaid Other

## 2012-09-11 DIAGNOSIS — Z87891 Personal history of nicotine dependence: Secondary | ICD-10-CM | POA: Insufficient documentation

## 2012-09-11 DIAGNOSIS — F329 Major depressive disorder, single episode, unspecified: Secondary | ICD-10-CM | POA: Insufficient documentation

## 2012-09-11 DIAGNOSIS — Y9389 Activity, other specified: Secondary | ICD-10-CM | POA: Insufficient documentation

## 2012-09-11 DIAGNOSIS — F3289 Other specified depressive episodes: Secondary | ICD-10-CM | POA: Insufficient documentation

## 2012-09-11 DIAGNOSIS — Z79899 Other long term (current) drug therapy: Secondary | ICD-10-CM | POA: Insufficient documentation

## 2012-09-11 DIAGNOSIS — Y92410 Unspecified street and highway as the place of occurrence of the external cause: Secondary | ICD-10-CM | POA: Insufficient documentation

## 2012-09-11 DIAGNOSIS — G40909 Epilepsy, unspecified, not intractable, without status epilepticus: Secondary | ICD-10-CM | POA: Insufficient documentation

## 2012-09-11 DIAGNOSIS — G8929 Other chronic pain: Secondary | ICD-10-CM | POA: Insufficient documentation

## 2012-09-11 DIAGNOSIS — Z043 Encounter for examination and observation following other accident: Secondary | ICD-10-CM | POA: Insufficient documentation

## 2012-09-11 DIAGNOSIS — Z8739 Personal history of other diseases of the musculoskeletal system and connective tissue: Secondary | ICD-10-CM | POA: Insufficient documentation

## 2012-09-11 DIAGNOSIS — Z8719 Personal history of other diseases of the digestive system: Secondary | ICD-10-CM | POA: Insufficient documentation

## 2012-09-11 HISTORY — DX: Opioid dependence, uncomplicated: F11.20

## 2012-09-11 HISTORY — DX: Other chronic pain: G89.29

## 2012-09-11 HISTORY — DX: Other psychoactive substance abuse, uncomplicated: F19.10

## 2012-09-11 NOTE — ED Notes (Signed)
Pt left without receiving d/c papers and signing e-sig.

## 2012-09-11 NOTE — ED Notes (Signed)
Per EMS - pt was ambulatory on scene upon their arrival.
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
## 2012-10-14 ENCOUNTER — Encounter (HOSPITAL_COMMUNITY): Payer: Self-pay | Admitting: Emergency Medicine

## 2012-12-07 DIAGNOSIS — L732 Hidradenitis suppurativa: Secondary | ICD-10-CM | POA: Insufficient documentation

## 2013-03-09 DIAGNOSIS — F4310 Post-traumatic stress disorder, unspecified: Secondary | ICD-10-CM | POA: Insufficient documentation

## 2013-03-09 DIAGNOSIS — F1920 Other psychoactive substance dependence, uncomplicated: Secondary | ICD-10-CM | POA: Insufficient documentation

## 2013-08-24 ENCOUNTER — Observation Stay (HOSPITAL_COMMUNITY): Payer: MEDICAID

## 2013-08-24 ENCOUNTER — Inpatient Hospital Stay (HOSPITAL_COMMUNITY)
Admission: EM | Admit: 2013-08-24 | Discharge: 2013-08-29 | DRG: 897 | Disposition: A | Discharge status: Home / Self Care | Payer: MEDICAID | Attending: Internal Medicine | Admitting: Internal Medicine

## 2013-08-24 ENCOUNTER — Encounter (HOSPITAL_COMMUNITY): Payer: Self-pay | Admitting: Emergency Medicine

## 2013-08-24 DIAGNOSIS — F19939 Other psychoactive substance use, unspecified with withdrawal, unspecified: Secondary | ICD-10-CM | POA: Diagnosis not present

## 2013-08-24 DIAGNOSIS — K5900 Constipation, unspecified: Secondary | ICD-10-CM | POA: Diagnosis present

## 2013-08-24 DIAGNOSIS — N390 Urinary tract infection, site not specified: Secondary | ICD-10-CM | POA: Diagnosis present

## 2013-08-24 DIAGNOSIS — R740 Nonspecific elevation of levels of transaminase and lactic acid dehydrogenase [LDH]: Secondary | ICD-10-CM

## 2013-08-24 DIAGNOSIS — F19930 Other psychoactive substance use, unspecified with withdrawal, uncomplicated: Secondary | ICD-10-CM

## 2013-08-24 DIAGNOSIS — F1120 Opioid dependence, uncomplicated: Secondary | ICD-10-CM

## 2013-08-24 DIAGNOSIS — B1920 Unspecified viral hepatitis C without hepatic coma: Secondary | ICD-10-CM | POA: Diagnosis present

## 2013-08-24 DIAGNOSIS — Z818 Family history of other mental and behavioral disorders: Secondary | ICD-10-CM | POA: Diagnosis not present

## 2013-08-24 DIAGNOSIS — R7401 Elevation of levels of liver transaminase levels: Secondary | ICD-10-CM | POA: Diagnosis present

## 2013-08-24 DIAGNOSIS — Z8249 Family history of ischemic heart disease and other diseases of the circulatory system: Secondary | ICD-10-CM | POA: Diagnosis not present

## 2013-08-24 DIAGNOSIS — Z9089 Acquired absence of other organs: Secondary | ICD-10-CM | POA: Diagnosis not present

## 2013-08-24 DIAGNOSIS — F17200 Nicotine dependence, unspecified, uncomplicated: Secondary | ICD-10-CM | POA: Diagnosis present

## 2013-08-24 DIAGNOSIS — F32A Depression, unspecified: Secondary | ICD-10-CM

## 2013-08-24 DIAGNOSIS — B182 Chronic viral hepatitis C: Secondary | ICD-10-CM

## 2013-08-24 DIAGNOSIS — G8929 Other chronic pain: Secondary | ICD-10-CM | POA: Diagnosis present

## 2013-08-24 DIAGNOSIS — R7402 Elevation of levels of lactic acid dehydrogenase (LDH): Secondary | ICD-10-CM | POA: Diagnosis present

## 2013-08-24 DIAGNOSIS — F1994 Other psychoactive substance use, unspecified with psychoactive substance-induced mood disorder: Secondary | ICD-10-CM | POA: Diagnosis present

## 2013-08-24 DIAGNOSIS — Z79899 Other long term (current) drug therapy: Secondary | ICD-10-CM

## 2013-08-24 DIAGNOSIS — F1123 Opioid dependence with withdrawal: Secondary | ICD-10-CM

## 2013-08-24 DIAGNOSIS — K219 Gastro-esophageal reflux disease without esophagitis: Secondary | ICD-10-CM | POA: Diagnosis present

## 2013-08-24 DIAGNOSIS — Z888 Allergy status to other drugs, medicaments and biological substances status: Secondary | ICD-10-CM | POA: Diagnosis not present

## 2013-08-24 DIAGNOSIS — F4310 Post-traumatic stress disorder, unspecified: Secondary | ICD-10-CM | POA: Diagnosis present

## 2013-08-24 DIAGNOSIS — N3000 Acute cystitis without hematuria: Secondary | ICD-10-CM

## 2013-08-24 DIAGNOSIS — F1110 Opioid abuse, uncomplicated: Secondary | ICD-10-CM | POA: Diagnosis present

## 2013-08-24 DIAGNOSIS — F19239 Other psychoactive substance dependence with withdrawal, unspecified: Secondary | ICD-10-CM | POA: Diagnosis present

## 2013-08-24 DIAGNOSIS — F332 Major depressive disorder, recurrent severe without psychotic features: Secondary | ICD-10-CM | POA: Diagnosis present

## 2013-08-24 DIAGNOSIS — F1320 Sedative, hypnotic or anxiolytic dependence, uncomplicated: Secondary | ICD-10-CM | POA: Diagnosis present

## 2013-08-24 DIAGNOSIS — R569 Unspecified convulsions: Secondary | ICD-10-CM | POA: Diagnosis present

## 2013-08-24 DIAGNOSIS — F1210 Cannabis abuse, uncomplicated: Secondary | ICD-10-CM | POA: Diagnosis present

## 2013-08-24 DIAGNOSIS — Z833 Family history of diabetes mellitus: Secondary | ICD-10-CM

## 2013-08-24 DIAGNOSIS — Z609 Problem related to social environment, unspecified: Secondary | ICD-10-CM | POA: Diagnosis not present

## 2013-08-24 DIAGNOSIS — F1910 Other psychoactive substance abuse, uncomplicated: Secondary | ICD-10-CM | POA: Diagnosis present

## 2013-08-24 DIAGNOSIS — F19230 Other psychoactive substance dependence with withdrawal, uncomplicated: Secondary | ICD-10-CM

## 2013-08-24 DIAGNOSIS — F329 Major depressive disorder, single episode, unspecified: Secondary | ICD-10-CM

## 2013-08-24 DIAGNOSIS — F1193 Opioid use, unspecified with withdrawal: Secondary | ICD-10-CM

## 2013-08-24 LAB — URINALYSIS, ROUTINE W REFLEX MICROSCOPIC
Glucose, UA: NEGATIVE mg/dL
HGB URINE DIPSTICK: NEGATIVE
KETONES UR: NEGATIVE mg/dL
LEUKOCYTES UA: NEGATIVE
Nitrite: NEGATIVE
PROTEIN: NEGATIVE mg/dL
Specific Gravity, Urine: 1.028 (ref 1.005–1.030)
UROBILINOGEN UA: 1 mg/dL (ref 0.0–1.0)
pH: 5 (ref 5.0–8.0)

## 2013-08-24 LAB — CBC WITH DIFFERENTIAL/PLATELET
BASOS ABS: 0 10*3/uL (ref 0.0–0.1)
BASOS PCT: 0 % (ref 0–1)
Eosinophils Absolute: 0.1 10*3/uL (ref 0.0–0.7)
Eosinophils Relative: 2 % (ref 0–5)
HCT: 34.9 % — ABNORMAL LOW (ref 36.0–46.0)
HEMOGLOBIN: 12.3 g/dL (ref 12.0–15.0)
Lymphocytes Relative: 39 % (ref 12–46)
Lymphs Abs: 1.5 10*3/uL (ref 0.7–4.0)
MCH: 29.8 pg (ref 26.0–34.0)
MCHC: 35.2 g/dL (ref 30.0–36.0)
MCV: 84.5 fL (ref 78.0–100.0)
MONOS PCT: 7 % (ref 3–12)
Monocytes Absolute: 0.3 10*3/uL (ref 0.1–1.0)
NEUTROS ABS: 2.1 10*3/uL (ref 1.7–7.7)
NEUTROS PCT: 52 % (ref 43–77)
PLATELETS: 264 10*3/uL (ref 150–400)
RBC: 4.13 MIL/uL (ref 3.87–5.11)
RDW: 12.4 % (ref 11.5–15.5)
WBC: 3.9 10*3/uL — ABNORMAL LOW (ref 4.0–10.5)

## 2013-08-24 LAB — COMPREHENSIVE METABOLIC PANEL
ALBUMIN: 4.3 g/dL (ref 3.5–5.2)
ALK PHOS: 78 U/L (ref 39–117)
ALT: 334 U/L — ABNORMAL HIGH (ref 0–35)
AST: 254 U/L — AB (ref 0–37)
BILIRUBIN TOTAL: 0.4 mg/dL (ref 0.3–1.2)
BUN: 20 mg/dL (ref 6–23)
CHLORIDE: 100 meq/L (ref 96–112)
CO2: 25 mEq/L (ref 19–32)
Calcium: 9.8 mg/dL (ref 8.4–10.5)
Creatinine, Ser: 0.71 mg/dL (ref 0.50–1.10)
GFR calc Af Amer: >90 mL/min (ref >90)
GFR calc non Af Amer: >90 mL/min (ref >90)
Glucose, Bld: 89 mg/dL (ref 70–99)
POTASSIUM: 3.7 meq/L (ref 3.7–5.3)
SODIUM: 140 meq/L (ref 137–147)
Total Protein: 8 g/dL (ref 6.0–8.3)

## 2013-08-24 LAB — RAPID URINE DRUG SCREEN, HOSP PERFORMED
AMPHETAMINES: POSITIVE — AB
BARBITURATES: NOT DETECTED
Benzodiazepines: POSITIVE — AB
Cocaine: NOT DETECTED
Opiates: POSITIVE — AB
TETRAHYDROCANNABINOL: POSITIVE — AB

## 2013-08-24 LAB — PREGNANCY, URINE: Preg Test, Ur: NEGATIVE

## 2013-08-24 LAB — ETHANOL

## 2013-08-24 MED ORDER — CLONAZEPAM 1 MG PO TABS
1.0000 mg | ORAL_TABLET | Freq: Two times a day (BID) | ORAL | Status: DC
  Administered 2013-08-24 – 2013-08-29 (×10): 1 mg via ORAL
  Filled 2013-08-24 (×10): qty 1

## 2013-08-24 MED ORDER — CLONIDINE HCL 0.1 MG PO TABS
0.1000 mg | ORAL_TABLET | ORAL | Status: AC
  Administered 2013-08-26 – 2013-08-27 (×3): 0.1 mg via ORAL
  Filled 2013-08-24 (×4): qty 1

## 2013-08-24 MED ORDER — LORAZEPAM 2 MG/ML IJ SOLN: 2.0000 mg | INTRAMUSCULAR | Status: DC | PRN

## 2013-08-24 MED ORDER — NICOTINE 7 MG/24HR TD PT24
7.0000 mg | MEDICATED_PATCH | Freq: Every day | TRANSDERMAL | Status: DC
  Administered 2013-08-25 – 2013-08-28 (×5): 7 mg via TRANSDERMAL
  Filled 2013-08-24 (×6): qty 1

## 2013-08-24 MED ORDER — METHOCARBAMOL 500 MG PO TABS
500.0000 mg | ORAL_TABLET | Freq: Three times a day (TID) | ORAL | Status: DC | PRN
  Administered 2013-08-25 – 2013-08-26 (×2): 500 mg via ORAL
  Filled 2013-08-24 (×2): qty 1

## 2013-08-24 MED ORDER — HYDROXYZINE HCL 25 MG PO TABS
25.0000 mg | ORAL_TABLET | Freq: Four times a day (QID) | ORAL | Status: DC | PRN
  Administered 2013-08-25 (×2): 25 mg via ORAL
  Filled 2013-08-24 (×2): qty 1

## 2013-08-24 MED ORDER — LOPERAMIDE HCL 2 MG PO CAPS: 2.0000 mg | ORAL_CAPSULE | ORAL | Status: DC | PRN

## 2013-08-24 MED ORDER — NAPROXEN 500 MG PO TABS
500.0000 mg | ORAL_TABLET | Freq: Two times a day (BID) | ORAL | Status: DC | PRN
  Filled 2013-08-24: qty 1

## 2013-08-24 MED ORDER — CLONIDINE HCL 0.1 MG PO TABS
0.1000 mg | ORAL_TABLET | Freq: Every day | ORAL | Status: DC
  Filled 2013-08-24 (×2): qty 1

## 2013-08-24 MED ORDER — ONDANSETRON HCL 4 MG PO TABS
4.0000 mg | ORAL_TABLET | Freq: Four times a day (QID) | ORAL | Status: DC | PRN
  Administered 2013-08-27: 4 mg via ORAL
  Filled 2013-08-24: qty 1

## 2013-08-24 MED ORDER — ACETAMINOPHEN 325 MG PO TABS
650.0000 mg | ORAL_TABLET | Freq: Once | ORAL | Status: AC
  Administered 2013-08-24: 650 mg via ORAL
  Filled 2013-08-24: qty 2

## 2013-08-24 MED ORDER — GABAPENTIN 400 MG PO CAPS
800.0000 mg | ORAL_CAPSULE | Freq: Four times a day (QID) | ORAL | Status: DC
  Administered 2013-08-24 – 2013-08-29 (×18): 800 mg via ORAL
  Filled 2013-08-24 (×22): qty 2

## 2013-08-24 MED ORDER — SODIUM CHLORIDE 0.9 % IV SOLN
INTRAVENOUS | Status: DC
  Administered 2013-08-24 – 2013-08-27 (×7): via INTRAVENOUS

## 2013-08-24 MED ORDER — HEPARIN SODIUM (PORCINE) 5000 UNIT/ML IJ SOLN
5000.0000 [IU] | Freq: Three times a day (TID) | INTRAMUSCULAR | Status: DC
  Administered 2013-08-24 – 2013-08-29 (×14): 5000 [IU] via SUBCUTANEOUS
  Filled 2013-08-24 (×18): qty 1

## 2013-08-24 MED ORDER — ONDANSETRON HCL 4 MG/2ML IJ SOLN
4.0000 mg | Freq: Four times a day (QID) | INTRAMUSCULAR | Status: DC | PRN
  Administered 2013-08-25 – 2013-08-26 (×2): 4 mg via INTRAVENOUS
  Filled 2013-08-24 (×3): qty 2

## 2013-08-24 MED ORDER — LORAZEPAM 2 MG/ML IJ SOLN
1.0000 mg | Freq: Once | INTRAMUSCULAR | Status: AC
  Administered 2013-08-24: 1 mg via INTRAVENOUS
  Filled 2013-08-24: qty 1

## 2013-08-24 MED ORDER — KETOROLAC TROMETHAMINE 15 MG/ML IJ SOLN
15.0000 mg | Freq: Four times a day (QID) | INTRAMUSCULAR | Status: DC | PRN
  Administered 2013-08-25 – 2013-08-27 (×5): 15 mg via INTRAVENOUS
  Filled 2013-08-24 (×6): qty 1

## 2013-08-24 MED ORDER — BUSPIRONE HCL 10 MG PO TABS
20.0000 mg | ORAL_TABLET | Freq: Three times a day (TID) | ORAL | Status: DC
  Administered 2013-08-24 – 2013-08-29 (×14): 20 mg via ORAL
  Filled 2013-08-24 (×16): qty 2

## 2013-08-24 MED ORDER — DULOXETINE HCL 30 MG PO CPEP
30.0000 mg | ORAL_CAPSULE | Freq: Every day | ORAL | Status: DC
  Administered 2013-08-24 – 2013-08-29 (×6): 30 mg via ORAL
  Filled 2013-08-24 (×7): qty 1

## 2013-08-24 MED ORDER — ONDANSETRON 4 MG PO TBDP
4.0000 mg | ORAL_TABLET | Freq: Four times a day (QID) | ORAL | Status: DC | PRN
  Filled 2013-08-24: qty 1

## 2013-08-24 MED ORDER — DICYCLOMINE HCL 20 MG PO TABS
20.0000 mg | ORAL_TABLET | Freq: Four times a day (QID) | ORAL | Status: DC | PRN
  Administered 2013-08-25 – 2013-08-27 (×4): 20 mg via ORAL
  Filled 2013-08-24 (×4): qty 1

## 2013-08-24 MED ORDER — CLONIDINE HCL 0.1 MG PO TABS
0.1000 mg | ORAL_TABLET | Freq: Four times a day (QID) | ORAL | Status: AC
  Administered 2013-08-25 – 2013-08-26 (×6): 0.1 mg via ORAL
  Filled 2013-08-24 (×8): qty 1

## 2013-08-24 MED ORDER — SODIUM CHLORIDE 0.9 % IJ SOLN
3.0000 mL | Freq: Two times a day (BID) | INTRAMUSCULAR | Status: DC
  Administered 2013-08-27 – 2013-08-28 (×3): 3 mL via INTRAVENOUS

## 2013-08-24 MED ORDER — LEVETIRACETAM 500 MG/5ML IV SOLN
500.0000 mg | Freq: Two times a day (BID) | INTRAVENOUS | Status: DC
  Administered 2013-08-24 – 2013-08-26 (×4): 500 mg via INTRAVENOUS
  Filled 2013-08-24 (×4): qty 5

## 2013-08-24 MED ORDER — METHADONE HCL 10 MG/ML PO CONC
90.0000 mg | Freq: Every day | ORAL | Status: DC
Start: 2013-08-25 — End: 2013-08-25

## 2013-08-24 NOTE — ED Notes (Signed)
Pt wants detox from marijuana, methadone, benzos, adderall.  Last smoked marijuana 7 days ago.  Dosed with methadone this morning.  Last adderall 6 days ago.  Last benzos 2 days ago.  Denies SI, HI.

## 2013-08-24 NOTE — Progress Notes (Signed)
Pt complaining of burning with urination and discharge from her vagina. Pt thinks she might have an STD and would like to be treated while she is here.

## 2013-08-24 NOTE — ED Provider Notes (Signed)
CSN: [id number]     Arrival date & time 08/24/13  1305 History   First MD Initiated Contact with Patient 08/24/13 1622     Chief Complaint  Patient presents with  . Detox      (Consider location/radiation/quality/duration/timing/severity/associated sxs/prior Treatment) The history is provided by the patient.   patient states that she wants detox off all of her narcotics. She states that she is on methadone and was last dosed with 90 mg earlier today. She was a heroin abuser also. She had been doing heroin while on methadone, not recently. She also states that she wants treatment for her benzodiazepines. She states she was on a milligram of Klonopin 3 times a day. She states she's been out of it for 2 days. She states she is 9 seizures while she was sleeping last night. She has a history of seizure disorder, but states she's only had it with withdrawal from benzodiazepines. She states she is now on disability because of seizures. She is also reportedly on Adderal. She denies suicidal or homicidal thoughts. She states that she is a drug addict. Past Medical History  Diagnosis Date  . Drug abuse   . GERD (gastroesophageal reflux disease)   . Depression   . Arthritis   . Seizures   . Chronic pain   . Methadone maintenance therapy patient    Past Surgical History  Procedure Laterality Date  . Appendectomy    . Cesarean section    . Mandible fracture surgery     Family History  Problem Relation Age of Onset  . Mental illness Mother   . Mental illness Brother   . Diabetes Maternal Grandmother   . Mental illness Maternal Grandmother   . Hypertension Maternal Grandfather   . Diabetes Paternal Grandmother   . Heart disease Paternal Grandmother    History  Substance Use Topics  . Smoking status: Current Some Day Smoker -- 1.00 packs/day for 5 years    Last Attempt to Quit: 01/13/2011  . Smokeless tobacco: Never Used  . Alcohol Use: Yes   OB History   Grav Para Term Preterm  Abortions TAB SAB Ect Mult Living   3 2   1 1          Review of Systems  Constitutional: Negative for activity change and appetite change.  Eyes: Negative for pain.  Respiratory: Negative for chest tightness and shortness of breath.   Cardiovascular: Negative for chest pain and leg swelling.  Gastrointestinal: Negative for nausea, vomiting, abdominal pain and diarrhea.  Genitourinary: Negative for flank pain.  Musculoskeletal: Negative for back pain and neck stiffness.  Skin: Negative for rash.  Neurological: Positive for seizures. Negative for weakness, numbness and headaches.  Psychiatric/Behavioral: Negative for behavioral problems.      Allergies  Narcan and Suboxone  Home Medications   Prior to Admission medications   Medication Sig Start Date End Date Taking? Authorizing Provider  busPIRone (BUSPAR) 10 MG tablet Take 20 mg by mouth 3 (three) times daily.   Yes Historical Provider, MD  clonazePAM (KLONOPIN) 1 MG tablet Take 1 mg by mouth 2 (two) times daily.   Yes Historical Provider, MD  DULoxetine (CYMBALTA) 30 MG capsule Take 30 mg by mouth daily.   Yes Historical Provider, MD  gabapentin (NEURONTIN) 800 MG tablet Take 800 mg by mouth 4 (four) times daily.   Yes Historical Provider, MD  methadone (DOLOPHINE) 10 MG/ML solution Take 90 mg by mouth daily.   Yes Historical Provider, MD  BP 115/67  Pulse 95  Temp(Src) 98 F (36.7 C) (Oral)  Resp 16  Ht 5\' 3"  (1.6 m)  Wt 144 lb 6.4 oz (65.5 kg)  BMI 25.59 kg/m2  SpO2 100%  LMP 07/24/2013 Physical Exam  Nursing note and vitals reviewed. Constitutional: She is oriented to person, place, and time. She appears well-developed and well-nourished.  HENT:  Head: Normocephalic and atraumatic.  Eyes: EOM are normal. Pupils are equal, round, and reactive to light.  Neck: Normal range of motion. Neck supple.  Cardiovascular: Normal rate, regular rhythm and normal heart sounds.   No murmur heard. Pulmonary/Chest: Effort  normal and breath sounds normal. No respiratory distress. She has no wheezes. She has no rales.  Abdominal: Soft. Bowel sounds are normal. She exhibits no distension. There is no tenderness. There is no rebound and no guarding.  Musculoskeletal: Normal range of motion.  Neurological: She is alert and oriented to person, place, and time. No cranial nerve deficit.  Skin: Skin is warm and dry.  Psychiatric: She has a normal mood and affect. Her speech is normal.    ED Course  Procedures (including critical care time) Labs Review Labs Reviewed  CBC WITH DIFFERENTIAL - Abnormal; Notable for the following:    WBC 3.9 (*)    HCT 34.9 (*)    All other components within normal limits  COMPREHENSIVE METABOLIC PANEL - Abnormal; Notable for the following:    AST 254 (*)    ALT 334 (*)    All other components within normal limits  URINE RAPID DRUG SCREEN (HOSP PERFORMED) - Abnormal; Notable for the following:    Opiates POSITIVE (*)    Benzodiazepines POSITIVE (*)    Amphetamines POSITIVE (*)    Tetrahydrocannabinol POSITIVE (*)    All other components within normal limits  URINALYSIS, ROUTINE W REFLEX MICROSCOPIC - Abnormal; Notable for the following:    Color, Urine AMBER (*)    APPearance CLOUDY (*)    Bilirubin Urine SMALL (*)    All other components within normal limits  PREGNANCY, URINE  ETHANOL  COMPREHENSIVE METABOLIC PANEL  CBC    Imaging Review No results found.   EKG Interpretation None      MDM   Final diagnoses:  Polysubstance abuse  Withdrawal seizures    Patient with polysubstance abuse. Had seizure while in the ER. May be due to benzodiazepine withdrawal, however she did have quick recovery without much postictal. She states she's never had a seizure when she has not been withdrawing from alcohol. Will admit to internal medicine    Juliet RudeNathan R. Rubin PayorPickering, MD 08/25/13 [id number]

## 2013-08-24 NOTE — Progress Notes (Signed)
Pt states that she thinks she hit her head when she had the seizure in the ED. Pt has a visible bump on her head. Will communicate to day shift about bump to ask MD for orders related. Pt alert and oriented showing no s/s of concussion. Will continue to monitor.

## 2013-08-24 NOTE — H&P (Addendum)
[facility] History and Physical  [name] D Mitnick WUJ:[id number] DOB: 12/16/1985 DOA: 08/24/2013  Referring physician: Emergency Department PCP: No primary Kambrey Hagger on file.  Specialists:   Chief Complaint: Withdrawal seizure  HPI: [name] D Langone is a 28 y.o. female  With a hx of multi-substance abuse and prior withdrawal seizures who presents to ED initially seeking detox from drugs. While in ED, pt noted by staff to have seizure-like activity that was self-limiting, resolved with one dose of ativan. Per staff, no Todd's paralysis or confusion following episode. Given concerns for seizures, hospitalist consulted for consideration for admission.  Review of Systems:  Per above, the remainder of the 10pt ros reviewed and are neg  Past Medical History  Diagnosis Date  . Drug abuse   . GERD (gastroesophageal reflux disease)   . Depression   . Arthritis   . Seizures   . Chronic pain   . Methadone maintenance therapy patient    Past Surgical History  Procedure Laterality Date  . Appendectomy    . Cesarean section    . Mandible fracture surgery     Social History:  reports that she quit smoking about 2 years ago. She has never used smokeless tobacco. She reports that she drinks alcohol. She reports that she uses illicit drugs (Heroin and Marijuana).  where does patient live--home, ALF, SNF? and with whom if at home?  Can patient participate in ADLs?  Allergies  Allergen Reactions  . Narcan [Naloxone]     Interferes with methadone and causes withdrawal   . Suboxone [Buprenorphine Hcl-Naloxone Hcl]     Family History  Problem Relation Age of Onset  . Mental illness Mother   . Mental illness Brother   . Diabetes Maternal Grandmother   . Mental illness Maternal Grandmother   . Hypertension Maternal Grandfather   . Diabetes Paternal Grandmother   . Heart disease Paternal Grandmother    (be sure to complete)  Prior to Admission medications   Medication Sig Start Date End  Date Taking? Authorizing Aquita Simmering  busPIRone (BUSPAR) 10 MG tablet Take 20 mg by mouth 3 (three) times daily.   Yes Historical Havyn Ramo, MD  clonazePAM (KLONOPIN) 1 MG tablet Take 1 mg by mouth 2 (two) times daily.   Yes Historical Azlynn Mitnick, MD  DULoxetine (CYMBALTA) 30 MG capsule Take 30 mg by mouth daily.   Yes Historical Treniya Lobb, MD  gabapentin (NEURONTIN) 800 MG tablet Take 800 mg by mouth 4 (four) times daily.   Yes Historical Bernarr Longsworth, MD  methadone (DOLOPHINE) 10 MG/ML solution Take 90 mg by mouth daily.   Yes Historical Taylyn Brame, MD   Physical Exam: Filed Vitals:   08/24/13 1325 08/24/13 1906  BP: 127/79 117/73  Pulse: 98 98  Temp: 98.3 F (36.8 C)   TempSrc: Oral   Resp: 18 14  SpO2: 100% 99%     General:  Awake, in nad  Eyes: PERRL B  ENT: membranes moist, dentition fair  Neck: trachea midline, neck supple  Cardiovascular: regular, s1, s2  Respiratory: normal resp effort, no wheezing  Abdomen: soft, nondistended  Skin: normal skin turgor, no abnormal skin lesions seen  Musculoskeletal: perfused, no clubbing  Psychiatric: mood/affect nml, no auditory /visual hallucinations  Neurologic: cn2-12 grossly intact, strength/sensation intact  Labs on Admission:  Basic Metabolic Panel:  Recent Labs Lab 08/24/13 1707  NA 140  K 3.7  CL 100  CO2 25  GLUCOSE 89  BUN 20  CREATININE 0.71  CALCIUM 9.8   Liver Function  Tests:  Recent Labs Lab 08/24/13 1707  AST 254*  ALT 334*  ALKPHOS 78  BILITOT 0.4  PROT 8.0  ALBUMIN 4.3   No results found for this basename: LIPASE, AMYLASE,  in the last 168 hours No results found for this basename: AMMONIA,  in the last 168 hours CBC:  Recent Labs Lab 08/24/13 1707  WBC 3.9*  NEUTROABS 2.1  HGB 12.3  HCT 34.9*  MCV 84.5  PLT 264   Cardiac Enzymes: No results found for this basename: CKTOTAL, CKMB, CKMBINDEX, TROPONINI,  in the last 168 hours  BNP (last 3 results) No results found for this basename:  PROBNP,  in the last 8760 hours CBG: No results found for this basename: GLUCAP,  in the last 168 hours  Radiological Exams on Admission: No results found.   Assessment/Plan Principal Problem:   Withdrawal seizures Active Problems:   Polysubstance abuse   1. Withdrawal seizures 1. Will cont on keppra overnight 2. Order EEG in AM 3. Cont on PRN ativan for seizures 4. Consider Psych consult in AM 5. Admit to med-tele 2. Polysubstance abuse 1. Per above, consider psych consult in am 2. Will order clonidine protocol for opiate detox 3. Keppra overnight per above 3. Chronic pain 1. Cont chronic opiates 2. Clonidine protocol per above 4. DVT prophylaxis 1. Heparin subq 5. Transaminitis 1. Suspect secondary to polysubstance abuse 2. Will order liver US 3. Avoid hepatotoxic agents   Code Status: Full (must indicate code status--if unknown or must be presumed, indicate so) Family Communication: Pt in room (indicate person spoken with, if applicable, with phone number if by telephone) Disposition Plan: Pending (indicate anticipated LOS)  Time spent: 35min  CHIU, STEPHEN K [facility] Pager [phone]  If 7PM-7AM, please contact night-coverage [email or web address] Password [email or web address] 08/24/2013, 7:19 PM

## 2013-08-24 NOTE — Progress Notes (Signed)
Pt incredibly anxious about diagnosis and asks a lot of questions. RN answered as many as possible, but asked patient to write down questions to ask MD. Pt informed that she will have US in the morning and is to remain NPO after midnight. Will continue to monitor.

## 2013-08-25 ENCOUNTER — Encounter (HOSPITAL_COMMUNITY): Payer: Self-pay | Admitting: Radiology

## 2013-08-25 ENCOUNTER — Inpatient Hospital Stay (HOSPITAL_COMMUNITY): Payer: MEDICAID

## 2013-08-25 ENCOUNTER — Inpatient Hospital Stay (HOSPITAL_COMMUNITY): Admit: 2013-08-25 | Discharge: 2013-08-25 | Disposition: A | Discharge status: Home / Self Care | Payer: MEDICAID

## 2013-08-25 DIAGNOSIS — F329 Major depressive disorder, single episode, unspecified: Secondary | ICD-10-CM | POA: Diagnosis present

## 2013-08-25 DIAGNOSIS — R7401 Elevation of levels of liver transaminase levels: Secondary | ICD-10-CM

## 2013-08-25 DIAGNOSIS — F332 Major depressive disorder, recurrent severe without psychotic features: Secondary | ICD-10-CM

## 2013-08-25 DIAGNOSIS — F4310 Post-traumatic stress disorder, unspecified: Secondary | ICD-10-CM

## 2013-08-25 DIAGNOSIS — F32A Depression, unspecified: Secondary | ICD-10-CM | POA: Diagnosis present

## 2013-08-25 DIAGNOSIS — R740 Nonspecific elevation of levels of transaminase and lactic acid dehydrogenase [LDH]: Secondary | ICD-10-CM

## 2013-08-25 DIAGNOSIS — F3289 Other specified depressive episodes: Secondary | ICD-10-CM

## 2013-08-25 DIAGNOSIS — F1994 Other psychoactive substance use, unspecified with psychoactive substance-induced mood disorder: Secondary | ICD-10-CM

## 2013-08-25 LAB — COMPREHENSIVE METABOLIC PANEL
ALBUMIN: 4.3 g/dL (ref 3.5–5.2)
ALT: 306 U/L — ABNORMAL HIGH (ref 0–35)
AST: 193 U/L — AB (ref 0–37)
Alkaline Phosphatase: 80 U/L (ref 39–117)
BUN: 24 mg/dL — ABNORMAL HIGH (ref 6–23)
CO2: 25 mEq/L (ref 19–32)
CREATININE: 0.62 mg/dL (ref 0.50–1.10)
Calcium: 9.8 mg/dL (ref 8.4–10.5)
Chloride: 101 mEq/L (ref 96–112)
GFR calc Af Amer: >90 mL/min (ref >90)
GFR calc non Af Amer: >90 mL/min (ref >90)
Glucose, Bld: 80 mg/dL (ref 70–99)
Potassium: 3.6 mEq/L — ABNORMAL LOW (ref 3.7–5.3)
Sodium: 140 mEq/L (ref 137–147)
Total Bilirubin: 0.5 mg/dL (ref 0.3–1.2)
Total Protein: 8.2 g/dL (ref 6.0–8.3)

## 2013-08-25 LAB — MAGNESIUM: MAGNESIUM: 2.1 mg/dL (ref 1.5–2.5)

## 2013-08-25 LAB — CBC
HCT: 38.8 % (ref 36.0–46.0)
HEMOGLOBIN: 13.6 g/dL (ref 12.0–15.0)
MCH: 29.8 pg (ref 26.0–34.0)
MCHC: 35.1 g/dL (ref 30.0–36.0)
MCV: 85.1 fL (ref 78.0–100.0)
Platelets: 244 10*3/uL (ref 150–400)
RBC: 4.56 MIL/uL (ref 3.87–5.11)
RDW: 12.7 % (ref 11.5–15.5)
WBC: 4 10*3/uL (ref 4.0–10.5)

## 2013-08-25 MED ORDER — METHADONE HCL 10 MG PO TABS
90.0000 mg | ORAL_TABLET | Freq: Every day | ORAL | Status: DC
  Administered 2013-08-25 – 2013-08-29 (×5): 90 mg via ORAL
  Filled 2013-08-25 (×5): qty 9
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
## 2013-08-25 NOTE — Progress Notes (Signed)
Offsite EEG completed, results are pending.

## 2013-08-25 NOTE — Progress Notes (Signed)
[facility] PROGRESS NOTE  Ranelle Oystermanda D Durrett WUX:[id number] DOB: 05/04/1985 DOA: 08/24/2013 PCP: No primary provider on file.  Assessment/Plan: #1 withdrawal seizures Patient noted to have withdrawal seizures in the emergency room on admission. Patient with no postictal state. Patient stated that had a history of prior seizures after stopping benzos abruptly. Patient has been started on Keppra. EEG has been ordered and is pending. CT head with no acute abnormalities. If EEG is negative we'll likely discontinue Keppra. Will discuss with neurology.  #2 polysubstance abuse Continue clonidine protocol for opiate detox. Patient currently on Keppra secondary to problem #1. Will consult with psychiatry. Social work also following.  #3 chronic pain Continue clonidine protocol for opiate detox. Continue methadone. Will need to followup at the methadone clinic as outpatient.  #4 transaminitis Will check an acute hepatitis panel. Abdominal ultrasound pending.  #5 depression Stable. Continue Cymbalta and BuSpar.  #6 prophylaxis Heparin for DVT prophylaxis.  Code Status: Full Family Communication: Updated patient and stepfather bedside. Disposition Plan: Home when medically stable.   Consultants:  Psychiatry pending  Procedures:  Abdominal ultrasound pending  Antibiotics:  None  HPI/Subjective: Patient with no further seizures. Patient stated that she is interested in detox.  Objective: Filed Vitals:   08/25/13 1443  BP: 92/45  Pulse: 52  Temp: 97.5 F (36.4 C)  Resp: 12    Intake/Output Summary (Last 24 hours) at 08/25/13 1754 Last data filed at 08/25/13 1034  Gross per 24 hour  Intake  682.5 ml  Output    550 ml  Net  132.5 ml   Filed Weights   08/24/13 2052  Weight: 65.5 kg (144 lb 6.4 oz)    Exam:   General:  NAD  Cardiovascular: RRR  Respiratory: CTA B.  Abdomen: Soft, nontender, nondistended, positive bowel sounds  Musculoskeletal: No clubbing  cyanosis or edema.  Data Reviewed: Basic Metabolic Panel:  Recent Labs Lab 08/24/13 1707 08/25/13 0727  NA 140 140  K 3.7 3.6*  CL 100 101  CO2 25 25  GLUCOSE 89 80  BUN 20 24*  CREATININE 0.71 0.62  CALCIUM 9.8 9.8  MG  --  2.1   Liver Function Tests:  Recent Labs Lab 08/24/13 1707 08/25/13 0727  AST 254* 193*  ALT 334* 306*  ALKPHOS 78 80  BILITOT 0.4 0.5  PROT 8.0 8.2  ALBUMIN 4.3 4.3   No results found for this basename: LIPASE, AMYLASE,  in the last 168 hours No results found for this basename: AMMONIA,  in the last 168 hours CBC:  Recent Labs Lab 08/24/13 1707 08/25/13 0727  WBC 3.9* 4.0  NEUTROABS 2.1  --   HGB 12.3 13.6  HCT 34.9* 38.8  MCV 84.5 85.1  PLT 264 244   Cardiac Enzymes: No results found for this basename: CKTOTAL, CKMB, CKMBINDEX, TROPONINI,  in the last 168 hours BNP (last 3 results) No results found for this basename: PROBNP,  in the last 8760 hours CBG: No results found for this basename: GLUCAP,  in the last 168 hours  No results found for this or any previous visit (from the past 240 hour(s)).   Studies: Ct Head Wo Contrast  08/25/2013   CLINICAL DATA:  Seizure and confusion; recent trauma  EXAM: CT HEAD WITHOUT CONTRAST  TECHNIQUE: Contiguous axial images were obtained from the base of the skull through the vertex without intravenous contrast.  COMPARISON:  September 11, 2012  FINDINGS: The ventricles are normal in size and configuration. There is no  mass, hemorrhage, extra-axial fluid collection, or midline shift. Gray-white compartments appear normal. There is no demonstrable acute infarct.  Bony calvarium appears intact. The mastoid air cells are clear. There is deformity in the region of the left mandibular condyle suggesting an old healed fracture, a stable finding. There is opacification of the visualized right maxillary antrum.  IMPRESSION: No intracranial mass, hemorrhage, or acute infarct. Old trauma left mandibular condyle. Right  maxillary sinus disease.   Electronically Signed   By: Bretta BangWilliam  Woodruff M.D.   On: 08/25/2013 14:15   [place] Abdomen Limited Ruq  08/25/2013   CLINICAL DATA:  Elevated liver function tests  EXAM: US ABDOMEN LIMITED - RIGHT UPPER QUADRANT  COMPARISON:  None.  FINDINGS: Gallbladder:  No gallstones or wall thickening visualized. No sonographic Murphy sign noted.  Common bile duct:  Diameter: 8.9 mm. No duct stone is seen. The most distal aspect the duct was not visualized.  Liver:  Mild intrahepatic bile duct dilation. Normal parenchymal echogenicity. No mass or focal lesion. Normal liver size. Normal hepatopetal flow in the portal vein.  IMPRESSION: 1. Intra and extrahepatic bile duct dilation. This is abnormal in this patient's age group, but the etiology is unclear. If there are symptoms of biliary obstruction, followup ERCP or MRCP would be recommended. 2. No other abnormalities.   Electronically Signed   By: Amie Portlandavid  Ormond M.D.   On: 08/25/2013 14:04    Scheduled Meds: . busPIRone  20 mg Oral TID  . clonazePAM  1 mg Oral BID  . cloNIDine  0.1 mg Oral QID   Followed by  . [START ON 08/26/2013] cloNIDine  0.1 mg Oral BH-qamhs   Followed by  . [START ON 08/29/2013] cloNIDine  0.1 mg Oral QAC breakfast  . DULoxetine  30 mg Oral Daily  . gabapentin  800 mg Oral QID  . heparin  5,000 Units Subcutaneous 3 times per day  . levETIRAcetam  500 mg Intravenous Q12H  . methadone  90 mg Oral Daily  . nicotine  7 mg Transdermal Daily  . sodium chloride  3 mL Intravenous Q12H   Continuous Infusions: . sodium chloride 75 mL/hr at 08/24/13 2218    Principal Problem:   Withdrawal seizures Active Problems:   Polysubstance abuse   Drug withdrawal   Transaminitis   Depression    Time spent: 35 minutes    THOMPSON,DANIEL M.D. [facility] Pager [phone]. If 7PM-7AM, please contact night-coverage at [email or web address], password [facility] 08/25/2013, 5:54 PM  LOS: 1 day

## 2013-08-25 NOTE — Care Management Note (Addendum)
    Page 1 of 1   08/29/2013     12:10:13 PM CARE MANAGEMENT NOTE 08/29/2013  Patient:  [name],Melissa D   Account Number:  [id number]  Date Initiated:  08/25/2013  Documentation initiated by:  [facility]ron[facility]HABIR,KATHY  Subjective/Objective Assessment:   28 Y/O F ADMITTED W/WITHDRAWAL SEIZURE,POLY SUBSTANCE ABUSE.     Action/Plan:   FROM HOME.NO PCP.   Anticipated DC Date:  08/29/2013   Anticipated DC Plan:  HOME/SELF CARE      DC Planning Services  CM consult  [facility]      Choice offered to / List presented to:             Status of service:  Completed, signed off Medicare Important Message given?   (If response is "NO", the following Medicare IM given date fields will be blank) Date Medicare IM given:   Date Additional Medicare IM given:    Discharge Disposition:  HOME/SELF CARE  Per UR Regulation:  Reviewed for med. necessity/level of care/duration of stay  If discussed at Long Length of Stay Meetings, dates discussed:    Comments:  08/29/13 KATHY MAHABIR RN,BSN NCM [phone] PCP APPT SCHEDULED @ [facility]-PATIENT INFORMED TO NOT MISS APPT OR THEY WILL NOT RESCHEDULE-PATIENT VOICED UNDERSTANDING.PSYCH SW PROVIDED OTPT RESOURCES.  08/25/13 KATHY MAHABIR RN,BSN NCM [phone] REFERRAL FOR PCP.PATIENT GOING OFF FLOOR FOR PROCEDURE.WILL DISCUSS PCP LISTING,ENCOURAGING [facility] ONCE SHE RETURNS.

## 2013-08-25 NOTE — Progress Notes (Signed)
Nutrition Brief Note  Patient identified on the Malnutrition Screening Tool (MST) Report  Wt Readings from Last 15 Encounters:  08/24/13 144 lb 6.4 oz (65.5 kg)  02/04/11 156 lb (70.761 kg)    Body mass index is 25.59 kg/(m^2). Patient meets criteria for Overweight based on current BMI.   Current diet order is NPO for ultrasound. Labs and medications reviewed.   Pt undergoing US during time of RD assessment. Discussed pt's nutrition with family in room. Noted that pt with decreased appetite for 2 days d/t withdrawal symptoms.  Usual body weight 150 lbs, family endorsed a recent 5 lb unintentional wt loss while pt has been undergoing withdrawal; however pt tolerated >75% of dinner last night, and reported to be eager for diet advancement after US.  No nutrition interventions warranted at this time. If nutrition issues arise, please consult RD.   Lloyd HugerSarah F Beaty MS RD LDN Clinical Dietitian Pager:[phone]

## 2013-08-25 NOTE — Plan of Care (Signed)
Problem: Phase II Progression Outcomes Goal: Pain controlled Outcome: Adequate for Discharge Pt has chronic use of methadone.

## 2013-08-26 DIAGNOSIS — K219 Gastro-esophageal reflux disease without esophagitis: Secondary | ICD-10-CM | POA: Diagnosis present

## 2013-08-26 DIAGNOSIS — F1120 Opioid dependence, uncomplicated: Secondary | ICD-10-CM

## 2013-08-26 LAB — COMPREHENSIVE METABOLIC PANEL
ALT: 189 U/L — AB (ref 0–35)
AST: 95 U/L — AB (ref 0–37)
Albumin: 3.3 g/dL — ABNORMAL LOW (ref 3.5–5.2)
Alkaline Phosphatase: 76 U/L (ref 39–117)
BUN: 28 mg/dL — ABNORMAL HIGH (ref 6–23)
CALCIUM: 9.2 mg/dL (ref 8.4–10.5)
CO2: 26 mEq/L (ref 19–32)
Chloride: 107 mEq/L (ref 96–112)
Creatinine, Ser: 0.76 mg/dL (ref 0.50–1.10)
GFR calc non Af Amer: >90 mL/min (ref >90)
Glucose, Bld: 117 mg/dL — ABNORMAL HIGH (ref 70–99)
Potassium: 4.4 mEq/L (ref 3.7–5.3)
SODIUM: 142 meq/L (ref 137–147)
Total Bilirubin: <0.2 mg/dL — ABNORMAL LOW (ref 0.3–1.2)
Total Protein: 6.4 g/dL (ref 6.0–8.3)

## 2013-08-26 LAB — HEPATITIS PANEL, ACUTE
HCV Ab: REACTIVE — AB
HEP B C IGM: NONREACTIVE
Hep A IgM: NONREACTIVE
Hepatitis B Surface Ag: NEGATIVE

## 2013-08-26 LAB — IRON AND TIBC
Iron: 42 ug/dL (ref 42–135)
SATURATION RATIOS: 16 % — AB (ref 20–55)
TIBC: 267 ug/dL (ref 250–470)
UIBC: 225 ug/dL (ref 125–400)

## 2013-08-26 LAB — CBC
HCT: 31.6 % — ABNORMAL LOW (ref 36.0–46.0)
HEMOGLOBIN: 10.7 g/dL — AB (ref 12.0–15.0)
MCH: 29.4 pg (ref 26.0–34.0)
MCHC: 33.9 g/dL (ref 30.0–36.0)
MCV: 86.8 fL (ref 78.0–100.0)
Platelets: 197 10*3/uL (ref 150–400)
RBC: 3.64 MIL/uL — AB (ref 3.87–5.11)
RDW: 12.8 % (ref 11.5–15.5)
WBC: 3.3 10*3/uL — ABNORMAL LOW (ref 4.0–10.5)

## 2013-08-26 LAB — FERRITIN: Ferritin: 42 ng/mL (ref 10–291)

## 2013-08-26 LAB — FOLATE: Folate: >20 ng/mL

## 2013-08-26 LAB — MAGNESIUM: MAGNESIUM: 2 mg/dL (ref 1.5–2.5)

## 2013-08-26 LAB — VITAMIN B12: VITAMIN B 12: 907 pg/mL (ref 211–911)

## 2013-08-26 MED ORDER — SODIUM CHLORIDE 0.9 % IV BOLUS (SEPSIS)
1000.0000 mL | Freq: Once | INTRAVENOUS | Status: AC
Start: 2013-08-26 — End: 2013-08-26
  Administered 2013-08-26: 1000 mL via INTRAVENOUS

## 2013-08-26 MED ORDER — PANTOPRAZOLE SODIUM 40 MG PO TBEC
40.0000 mg | DELAYED_RELEASE_TABLET | Freq: Every day | ORAL | Status: DC
Start: 2013-08-26 — End: 2013-08-29
  Administered 2013-08-26 – 2013-08-29 (×4): 40 mg via ORAL
  Filled 2013-08-26 (×4): qty 1
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
## 2013-08-26 NOTE — Procedures (Signed)
EEG report.  Brief clinical history: 28 y/o noted to have withdrawal seizures in the emergency room on admission. Patient with no postictal state. Patient stated that had a history of prior seizures after stopping benzos abruptly. Patient has been started on Keppra  .  Technique: this is a 18 channel routine scalp EEG performed at the bedside with bipolar and monopolar montages arranged in accordance to the international 10/20 system of electrode placement. One channel was dedicated to EKG recording.  The study was performed during wakefulness, drowsiness, and stage 2 sleep. Intermittent photic stimulation was utilized as activating procedure.  Description:In the wakeful state, the best background consisted of a medium amplitude, posterior dominant, well sustained, symmetric and reactive 10 Hz rhythm. Drowsiness demonstrated dropout of the alpha rhythm. Stage 2 sleep showed symmetric and synchronous sleep spindles without intermixed epileptiform discharges. Hyperventilation was not carried out Intermittent photic stimulation did induce a normal driving response.  No focal or generalized epileptiform discharges noted.  No pathologic areas of slowing seen.  EKG showed sinus rhythm.  Impression: this is a normal awake and asleep EEG. Please, be aware that a normal EEG does not exclude the possibility of epilepsy.  Clinical correlation is advised.  Melissa Portelasvaldo Camilo, MD

## 2013-08-26 NOTE — Progress Notes (Signed)
[facility] PROGRESS NOTE  [name] WUJ:[id number] DOB: 07/23/1985 DOA: 08/24/2013 PCP: No primary provider on file.  Assessment/Plan: #1 withdrawal seizures Patient noted to have withdrawal seizures in the emergency room on admission. Patient with no postictal state. Patient stated that had a history of prior seizures after stopping benzos abruptly. Patient has been started on Keppra. EEG is negative. CT head with no acute abnormalities. Will discontinue Keppra. Discussed with neurology via telephone.   #2 polysubstance abuse Continue clonidine protocol for opiate detox. Patient currently on Keppra secondary to problem #1. Will consult with psychiatry. Social work also following.  #3 chronic pain Continue clonidine protocol for opiate detox. Continue methadone. Will need to followup at the methadone clinic as outpatient.  #4 transaminitis LFTs trending down. Acute hepatitis panel pending. Abdominal ultrasound with intra-and extrahepatic bile duct dilatation. No signs or symptoms of biliary obstruction. Follow.   #5 depression Stable. Continue Cymbalta and BuSpar.  #6 prophylaxis Heparin for DVT prophylaxis.  Code Status: Full Family Communication: Updated patient at bedside. Disposition Plan: Home when medically stable.   Consultants:  Psychiatry: Dr Elsie SaasJonnalagadda 08/25/13  Procedures:  Abdominal ultrasound 08/25/13  CT Head 08/25/13  EEG 08/25/2013  Antibiotics:  None  HPI/Subjective: Patient with no further seizures. Patient tearful and anxious.  Objective: Filed Vitals:   08/26/13 0600  BP: 95/40  Pulse: 67  Temp: 98.1 F (36.7 C)  Resp: 16    Intake/Output Summary (Last 24 hours) at 08/26/13 1024 Last data filed at 08/26/13 0900  Gross per 24 hour  Intake   1185 ml  Output    750 ml  Net    435 ml   Filed Weights   08/24/13 2052  Weight: 65.5 kg (144 lb 6.4 oz)    Exam:   General:  NAD  Cardiovascular: RRR  Respiratory: CTA  B.  Abdomen: Soft, nontender, nondistended, positive bowel sounds  Musculoskeletal: No clubbing cyanosis or edema.  Data Reviewed: Basic Metabolic Panel:  Recent Labs Lab 08/24/13 1707 08/25/13 0727 08/26/13 0330  NA 140 140 142  K 3.7 3.6* 4.4  CL 100 101 107  CO2 25 25 26   GLUCOSE 89 80 117*  BUN 20 24* 28*  CREATININE 0.71 0.62 0.76  CALCIUM 9.8 9.8 9.2  MG  --  2.1 2.0   Liver Function Tests:  Recent Labs Lab 08/24/13 1707 08/25/13 0727 08/26/13 0330  AST 254* 193* 95*  ALT 334* 306* 189*  ALKPHOS 78 80 76  BILITOT 0.4 0.5 <0.2*  PROT 8.0 8.2 6.4  ALBUMIN 4.3 4.3 3.3*   No results found for this basename: LIPASE, AMYLASE,  in the last 168 hours No results found for this basename: AMMONIA,  in the last 168 hours CBC:  Recent Labs Lab 08/24/13 1707 08/25/13 0727 08/26/13 0330  WBC 3.9* 4.0 3.3*  NEUTROABS 2.1  --   --   HGB 12.3 13.6 10.7*  HCT 34.9* 38.8 31.6*  MCV 84.5 85.1 86.8  PLT 264 244 197   Cardiac Enzymes: No results found for this basename: CKTOTAL, CKMB, CKMBINDEX, TROPONINI,  in the last 168 hours BNP (last 3 results) No results found for this basename: PROBNP,  in the last 8760 hours CBG: No results found for this basename: GLUCAP,  in the last 168 hours  No results found for this or any previous visit (from the past 240 hour(s)).   Studies: Ct Head Wo Contrast  08/25/2013   CLINICAL DATA:  Seizure and confusion; recent trauma  EXAM: CT HEAD WITHOUT CONTRAST  TECHNIQUE: Contiguous axial images were obtained from the base of the skull through the vertex without intravenous contrast.  COMPARISON:  September 11, 2012  FINDINGS: The ventricles are normal in size and configuration. There is no mass, hemorrhage, extra-axial fluid collection, or midline shift. Gray-white compartments appear normal. There is no demonstrable acute infarct.  Bony calvarium appears intact. The mastoid air cells are clear. There is deformity in the region of the left  mandibular condyle suggesting an old healed fracture, a stable finding. There is opacification of the visualized right maxillary antrum.  IMPRESSION: No intracranial mass, hemorrhage, or acute infarct. Old trauma left mandibular condyle. Right maxillary sinus disease.   Electronically Signed   By: Bretta BangWilliam  Woodruff M.D.   On: 08/25/2013 14:15   [place] Abdomen Limited Ruq  08/25/2013   CLINICAL DATA:  Elevated liver function tests  EXAM: US ABDOMEN LIMITED - RIGHT UPPER QUADRANT  COMPARISON:  None.  FINDINGS: Gallbladder:  No gallstones or wall thickening visualized. No sonographic Murphy sign noted.  Common bile duct:  Diameter: 8.9 mm. No duct stone is seen. The most distal aspect the duct was not visualized.  Liver:  Mild intrahepatic bile duct dilation. Normal parenchymal echogenicity. No mass or focal lesion. Normal liver size. Normal hepatopetal flow in the portal vein.  IMPRESSION: 1. Intra and extrahepatic bile duct dilation. This is abnormal in this patient's age group, but the etiology is unclear. If there are symptoms of biliary obstruction, followup ERCP or MRCP would be recommended. 2. No other abnormalities.   Electronically Signed   By: Amie Portlandavid  Ormond M.D.   On: 08/25/2013 14:04    Scheduled Meds: . busPIRone  20 mg Oral TID  . clonazePAM  1 mg Oral BID  . cloNIDine  0.1 mg Oral QID   Followed by  . cloNIDine  0.1 mg Oral BH-qamhs   Followed by  . [START ON 08/29/2013] cloNIDine  0.1 mg Oral QAC breakfast  . DULoxetine  30 mg Oral Daily  . gabapentin  800 mg Oral QID  . heparin  5,000 Units Subcutaneous 3 times per day  . levETIRAcetam  500 mg Intravenous Q12H  . methadone  90 mg Oral Daily  . nicotine  7 mg Transdermal Daily  . pantoprazole  40 mg Oral Daily  . sodium chloride  3 mL Intravenous Q12H   Continuous Infusions: . sodium chloride 100 mL/hr at 08/26/13 [phone]    Principal Problem:   Withdrawal seizures Active Problems:   Polysubstance abuse   Drug withdrawal    Transaminitis   Depression   GERD (gastroesophageal reflux disease)    Time spent: 35 minutes    THOMPSON,DANIEL M.D. [facility] Pager [phone]. If 7PM-7AM, please contact night-coverage at [email or web address], password [facility] 08/26/2013, 10:24 AM  LOS: 2 days

## 2013-08-27 DIAGNOSIS — K5900 Constipation, unspecified: Secondary | ICD-10-CM | POA: Diagnosis present

## 2013-08-27 DIAGNOSIS — N390 Urinary tract infection, site not specified: Secondary | ICD-10-CM | POA: Clinically undetermined

## 2013-08-27 DIAGNOSIS — B1920 Unspecified viral hepatitis C without hepatic coma: Secondary | ICD-10-CM

## 2013-08-27 LAB — COMPREHENSIVE METABOLIC PANEL
ALK PHOS: 65 U/L (ref 39–117)
ALT: 132 U/L — AB (ref 0–35)
AST: 65 U/L — AB (ref 0–37)
Albumin: 2.8 g/dL — ABNORMAL LOW (ref 3.5–5.2)
BUN: 10 mg/dL (ref 6–23)
CALCIUM: 8.8 mg/dL (ref 8.4–10.5)
CHLORIDE: 108 meq/L (ref 96–112)
CO2: 23 mEq/L (ref 19–32)
Creatinine, Ser: 0.52 mg/dL (ref 0.50–1.10)
GFR calc non Af Amer: >90 mL/min (ref >90)
Glucose, Bld: 97 mg/dL (ref 70–99)
Potassium: 4.2 mEq/L (ref 3.7–5.3)
SODIUM: 143 meq/L (ref 137–147)
Total Protein: 5.6 g/dL — ABNORMAL LOW (ref 6.0–8.3)

## 2013-08-27 LAB — CBC
HCT: 30 % — ABNORMAL LOW (ref 36.0–46.0)
HEMOGLOBIN: 10.2 g/dL — AB (ref 12.0–15.0)
MCH: 29.8 pg (ref 26.0–34.0)
MCHC: 34 g/dL (ref 30.0–36.0)
MCV: 87.7 fL (ref 78.0–100.0)
PLATELETS: 169 10*3/uL (ref 150–400)
RBC: 3.42 MIL/uL — ABNORMAL LOW (ref 3.87–5.11)
RDW: 12.8 % (ref 11.5–15.5)
WBC: 3.6 10*3/uL — ABNORMAL LOW (ref 4.0–10.5)

## 2013-08-27 LAB — URINE MICROSCOPIC-ADD ON

## 2013-08-27 LAB — RPR

## 2013-08-27 LAB — URINALYSIS, ROUTINE W REFLEX MICROSCOPIC
Bilirubin Urine: NEGATIVE
Glucose, UA: NEGATIVE mg/dL
KETONES UR: NEGATIVE mg/dL
Nitrite: NEGATIVE
Protein, ur: NEGATIVE mg/dL
Specific Gravity, Urine: 1.009 (ref 1.005–1.030)
Urobilinogen, UA: 0.2 mg/dL (ref 0.0–1.0)
pH: 7.5 (ref 5.0–8.0)

## 2013-08-27 LAB — HIV ANTIBODY (ROUTINE TESTING W REFLEX): HIV: NONREACTIVE

## 2013-08-27 MED ORDER — ADULT MULTIVITAMIN W/MINERALS CH
1.0000 | ORAL_TABLET | Freq: Every day | ORAL | Status: DC
  Administered 2013-08-27 – 2013-08-29 (×3): 1 via ORAL
  Filled 2013-08-27 (×3): qty 1

## 2013-08-27 MED ORDER — DEXTROSE 5 % IV SOLN
1.0000 g | INTRAVENOUS | Status: DC
  Administered 2013-08-27: 1 g via INTRAVENOUS
  Filled 2013-08-27 (×3): qty 10

## 2013-08-27 MED ORDER — SENNOSIDES-DOCUSATE SODIUM 8.6-50 MG PO TABS
1.0000 | ORAL_TABLET | Freq: Every day | ORAL | Status: DC
  Administered 2013-08-27 – 2013-08-28 (×2): 1 via ORAL
  Filled 2013-08-27 (×3): qty 1

## 2013-08-27 MED ORDER — POLYETHYLENE GLYCOL 3350 17 G PO PACK
17.0000 g | PACK | Freq: Two times a day (BID) | ORAL | Status: DC
  Administered 2013-08-27 – 2013-08-29 (×3): 17 g via ORAL
  Filled 2013-08-27 (×5): qty 1

## 2013-08-27 NOTE — Progress Notes (Signed)
[facility] PROGRESS NOTE  [name] ZOX:[id number] DOB: 12/02/1985 DOA: 08/24/2013 PCP: No primary provider on file.  Assessment/Plan: #1 withdrawal seizures Patient noted to have withdrawal seizures in the emergency room on admission. Patient with no postictal state. Patient stated that had a history of prior seizures after stopping benzos abruptly. Keppra has been discontinued. EEG is negative. CT head with no acute abnormalities. Follow.   #2 polysubstance abuse Continue clonidine protocol for opiate detox.  Social work and psychiatry ff.   #3 chronic pain Continue clonidine protocol for opiate detox. Continue methadone. Will need to followup at the methadone clinic as outpatient.  #4 transaminitis/Hepatitis C LFTs trending down. Acute hepatitis panel c/w hepatitis C. Will check a viral load and genotype. Patient states ahd received vaccines for hep A and Hep B. Abdominal ultrasound with intra-and extrahepatic bile duct dilatation. No signs or symptoms of biliary obstruction. Outpatient f/u with ID.   #5 depression Stable. Continue Cymbalta and BuSpar.  #6 Constipation Place on miralax BID and Senokot S.   #7 prophylaxis Heparin for DVT prophylaxis.   Code Status: Full Family Communication: Updated patient at bedside. Disposition Plan: Home when medically stable.   Consultants:  Psychiatry: Dr Elsie SaasJonnalagadda 08/25/13  Procedures:  Abdominal ultrasound 08/25/13  CT Head 08/25/13  EEG 08/25/2013  Antibiotics:  None  HPI/Subjective: Patient with no further seizures. Patient anxious. Patient c/o constipation.  Objective: Filed Vitals:   08/27/13 0750  BP: 108/55  Pulse: 70  Temp:   Resp:     Intake/Output Summary (Last 24 hours) at 08/27/13 1059 Last data filed at 08/27/13 0826  Gross per 24 hour  Intake 4853.33 ml  Output   3700 ml  Net 1153.33 ml   Filed Weights   08/24/13 2052  Weight: 65.5 kg (144 lb 6.4 oz)    Exam:   General:   NAD  Cardiovascular: RRR  Respiratory: CTA B.  Abdomen: Soft, nontender, nondistended, positive bowel sounds  Musculoskeletal: No clubbing cyanosis or edema.  Data Reviewed: Basic Metabolic Panel:  Recent Labs Lab 08/24/13 1707 08/25/13 0727 08/26/13 0330 08/27/13 0341  NA 140 140 142 143  K 3.7 3.6* 4.4 4.2  CL 100 101 107 108  CO2 25 25 26 23   GLUCOSE 89 80 117* 97  BUN 20 24* 28* 10  CREATININE 0.71 0.62 0.76 0.52  CALCIUM 9.8 9.8 9.2 8.8  MG  --  2.1 2.0  --    Liver Function Tests:  Recent Labs Lab 08/24/13 1707 08/25/13 0727 08/26/13 0330 08/27/13 0341  AST 254* 193* 95* 65*  ALT 334* 306* 189* 132*  ALKPHOS 78 80 76 65  BILITOT 0.4 0.5 <0.2* <0.2*  PROT 8.0 8.2 6.4 5.6*  ALBUMIN 4.3 4.3 3.3* 2.8*   No results found for this basename: LIPASE, AMYLASE,  in the last 168 hours No results found for this basename: AMMONIA,  in the last 168 hours CBC:  Recent Labs Lab 08/24/13 1707 08/25/13 0727 08/26/13 0330 08/27/13 0341  WBC 3.9* 4.0 3.3* 3.6*  NEUTROABS 2.1  --   --   --   HGB 12.3 13.6 10.7* 10.2*  HCT 34.9* 38.8 31.6* 30.0*  MCV 84.5 85.1 86.8 87.7  PLT 264 244 197 169   Cardiac Enzymes: No results found for this basename: CKTOTAL, CKMB, CKMBINDEX, TROPONINI,  in the last 168 hours BNP (last 3 results) No results found for this basename: PROBNP,  in the last 8760 hours CBG: No results found for this basename:  GLUCAP,  in the last 168 hours  No results found for this or any previous visit (from the past 240 hour(s)).   Studies: Ct Head Wo Contrast  08/25/2013   CLINICAL DATA:  Seizure and confusion; recent trauma  EXAM: CT HEAD WITHOUT CONTRAST  TECHNIQUE: Contiguous axial images were obtained from the base of the skull through the vertex without intravenous contrast.  COMPARISON:  September 11, 2012  FINDINGS: The ventricles are normal in size and configuration. There is no mass, hemorrhage, extra-axial fluid collection, or midline shift.  Gray-white compartments appear normal. There is no demonstrable acute infarct.  Bony calvarium appears intact. The mastoid air cells are clear. There is deformity in the region of the left mandibular condyle suggesting an old healed fracture, a stable finding. There is opacification of the visualized right maxillary antrum.  IMPRESSION: No intracranial mass, hemorrhage, or acute infarct. Old trauma left mandibular condyle. Right maxillary sinus disease.   Electronically Signed   By: Bretta BangWilliam  Woodruff M.D.   On: 08/25/2013 14:15   [place] Abdomen Limited Ruq  08/25/2013   CLINICAL DATA:  Elevated liver function tests  EXAM: US ABDOMEN LIMITED - RIGHT UPPER QUADRANT  COMPARISON:  None.  FINDINGS: Gallbladder:  No gallstones or wall thickening visualized. No sonographic Murphy sign noted.  Common bile duct:  Diameter: 8.9 mm. No duct stone is seen. The most distal aspect the duct was not visualized.  Liver:  Mild intrahepatic bile duct dilation. Normal parenchymal echogenicity. No mass or focal lesion. Normal liver size. Normal hepatopetal flow in the portal vein.  IMPRESSION: 1. Intra and extrahepatic bile duct dilation. This is abnormal in this patient's age group, but the etiology is unclear. If there are symptoms of biliary obstruction, followup ERCP or MRCP would be recommended. 2. No other abnormalities.   Electronically Signed   By: Amie Portlandavid  Ormond M.D.   On: 08/25/2013 14:04    Scheduled Meds: . busPIRone  20 mg Oral TID  . clonazePAM  1 mg Oral BID  . cloNIDine  0.1 mg Oral BH-qamhs   Followed by  . [START ON 08/29/2013] cloNIDine  0.1 mg Oral QAC breakfast  . DULoxetine  30 mg Oral Daily  . gabapentin  800 mg Oral QID  . heparin  5,000 Units Subcutaneous 3 times per day  . methadone  90 mg Oral Daily  . multivitamin with minerals  1 tablet Oral Daily  . nicotine  7 mg Transdermal Daily  . pantoprazole  40 mg Oral Daily  . sodium chloride  3 mL Intravenous Q12H   Continuous Infusions:     Principal Problem:   Withdrawal seizures Active Problems:   Polysubstance abuse   Drug withdrawal   Transaminitis   Depression   GERD (gastroesophageal reflux disease)   Hepatitis C    Time spent: 35 minutes    THOMPSON,DANIEL M.D. [facility] Pager [phone]. If 7PM-7AM, please contact night-coverage at [email or web address], password [email or web address] 08/27/2013, 10:59 AM  LOS: 3 days

## 2013-08-28 DIAGNOSIS — N390 Urinary tract infection, site not specified: Secondary | ICD-10-CM

## 2013-08-28 LAB — COMPREHENSIVE METABOLIC PANEL WITH GFR
ALT: 129 U/L — ABNORMAL HIGH (ref 0–35)
AST: 70 U/L — ABNORMAL HIGH (ref 0–37)
Albumin: 3.2 g/dL — ABNORMAL LOW (ref 3.5–5.2)
Alkaline Phosphatase: 71 U/L (ref 39–117)
BUN: 11 mg/dL (ref 6–23)
CO2: 28 meq/L (ref 19–32)
Calcium: 9.3 mg/dL (ref 8.4–10.5)
Chloride: 102 meq/L (ref 96–112)
Creatinine, Ser: 0.6 mg/dL (ref 0.50–1.10)
GFR calc Af Amer: >90 mL/min
GFR calc non Af Amer: >90 mL/min
Glucose, Bld: 96 mg/dL (ref 70–99)
Potassium: 4 meq/L (ref 3.7–5.3)
Sodium: 141 meq/L (ref 137–147)
Total Bilirubin: <0.2 mg/dL — ABNORMAL LOW (ref 0.3–1.2)
Total Protein: 5.9 g/dL — ABNORMAL LOW (ref 6.0–8.3)

## 2013-08-28 MED ORDER — PROCHLORPERAZINE MALEATE 10 MG PO TABS
10.0000 mg | ORAL_TABLET | Freq: Four times a day (QID) | ORAL | Status: DC | PRN
  Filled 2013-08-28: qty 1

## 2013-08-28 MED ORDER — SORBITOL 70 % SOLN
30.0000 mL | Freq: Every day | Status: DC | PRN
  Filled 2013-08-28: qty 30

## 2013-08-28 MED ORDER — MAGNESIUM CITRATE PO SOLN
1.0000 | Freq: Once | ORAL | Status: AC
  Administered 2013-08-28: 1 via ORAL

## 2013-08-28 MED ORDER — TRAMADOL HCL 50 MG PO TABS
100.0000 mg | ORAL_TABLET | Freq: Four times a day (QID) | ORAL | Status: DC | PRN
Start: 2013-08-28 — End: 2013-08-29

## 2013-08-28 NOTE — Progress Notes (Signed)
[facility] PROGRESS NOTE  [name] D Haese WUJ:[id number] DOB: 05/04/1985 DOA: 08/24/2013 PCP: No primary Cameo Schmiesing on file.  Assessment/Plan: #1 withdrawal seizures Patient noted to have withdrawal seizures in the emergency room on admission. Patient with no postictal state. Patient stated that had a history of prior seizures after stopping benzos abruptly. Keppra has been discontinued. EEG is negative. CT head with no acute abnormalities. Follow.   #2 polysubstance abuse Continue clonidine protocol for opiate detox.  Social work and psychiatry ff.   #3 chronic pain Continue clonidine protocol for opiate detox. Continue methadone. Will need to followup at the methadone clinic as outpatient.  #4 transaminitis/Hepatitis C LFTs trending down. Acute hepatitis panel c/w hepatitis C. Viral load and genotype pending. Patient states ahd received vaccines for hep A and Hep B. Abdominal ultrasound with intra-and extrahepatic bile duct dilatation. No signs or symptoms of biliary obstruction. Outpatient f/u with ID.   #5 depression Stable. Continue Cymbalta and BuSpar.  #6 Constipation Patient still with no bowel movement. Continue miralax BID and Senokot S. given a dose of magnesium citrate. Tapwater enema when necessary. Sorbitol when necessary.  #7 UTI Urine cultures pending. Continue IV Rocephin.  #8 prophylaxis Heparin for DVT prophylaxis.   Code Status: Full Family Communication: Updated patient at bedside. Disposition Plan: Home when medically stable.   Consultants:  Psychiatry: Dr Elsie SaasJonnalagadda 08/25/13  Procedures:  Abdominal ultrasound 08/25/13  CT Head 08/25/13  EEG 08/25/2013  Antibiotics:  IV Rocephin 08/27/2013  HPI/Subjective: Patient with no further seizures. Patient c/o constipation.  Objective: Filed Vitals:   08/28/13 0434  BP: 104/45  Pulse: 60  Temp: 98.1 F (36.7 C)  Resp:     Intake/Output Summary (Last 24 hours) at 08/28/13 1016 Last data  filed at 08/28/13 0945  Gross per 24 hour  Intake    960 ml  Output   2850 ml  Net  -1890 ml   Filed Weights   08/24/13 2052  Weight: 65.5 kg (144 lb 6.4 oz)    Exam:   General:  NAD  Cardiovascular: RRR  Respiratory: CTA B.  Abdomen: Soft, nontender, nondistended, positive bowel sounds  Musculoskeletal: No clubbing cyanosis or edema.  Data Reviewed: Basic Metabolic Panel:  Recent Labs Lab 08/24/13 1707 08/25/13 0727 08/26/13 0330 08/27/13 0341 08/28/13 0558  NA 140 140 142 143 141  K 3.7 3.6* 4.4 4.2 4.0  CL 100 101 107 108 102  CO2 25 25 26 23 28   GLUCOSE 89 80 117* 97 96  BUN 20 24* 28* 10 11  CREATININE 0.71 0.62 0.76 0.52 0.60  CALCIUM 9.8 9.8 9.2 8.8 9.3  MG  --  2.1 2.0  --   --    Liver Function Tests:  Recent Labs Lab 08/24/13 1707 08/25/13 0727 08/26/13 0330 08/27/13 0341 08/28/13 0558  AST 254* 193* 95* 65* 70*  ALT 334* 306* 189* 132* 129*  ALKPHOS 78 80 76 65 71  BILITOT 0.4 0.5 <0.2* <0.2* <0.2*  PROT 8.0 8.2 6.4 5.6* 5.9*  ALBUMIN 4.3 4.3 3.3* 2.8* 3.2*   No results found for this basename: LIPASE, AMYLASE,  in the last 168 hours No results found for this basename: AMMONIA,  in the last 168 hours CBC:  Recent Labs Lab 08/24/13 1707 08/25/13 0727 08/26/13 0330 08/27/13 0341  WBC 3.9* 4.0 3.3* 3.6*  NEUTROABS 2.1  --   --   --   HGB 12.3 13.6 10.7* 10.2*  HCT 34.9* 38.8 31.6* 30.0*  MCV 84.5 85.1  86.8 87.7  PLT 264 244 197 169   Cardiac Enzymes: No results found for this basename: CKTOTAL, CKMB, CKMBINDEX, TROPONINI,  in the last 168 hours BNP (last 3 results) No results found for this basename: PROBNP,  in the last 8760 hours CBG: No results found for this basename: GLUCAP,  in the last 168 hours  No results found for this or any previous visit (from the past 240 hour(s)).   Studies: No results found.  Scheduled Meds: . busPIRone  20 mg Oral TID  . cefTRIAXone (ROCEPHIN)  IV  1 g Intravenous Q24H  . clonazePAM   1 mg Oral BID  . cloNIDine  0.1 mg Oral BH-qamhs   Followed by  . [START ON 08/29/2013] cloNIDine  0.1 mg Oral QAC breakfast  . DULoxetine  30 mg Oral Daily  . gabapentin  800 mg Oral QID  . heparin  5,000 Units Subcutaneous 3 times per day  . methadone  90 mg Oral Daily  . multivitamin with minerals  1 tablet Oral Daily  . nicotine  7 mg Transdermal Daily  . pantoprazole  40 mg Oral Daily  . polyethylene glycol  17 g Oral BID  . senna-docusate  1 tablet Oral QHS  . sodium chloride  3 mL Intravenous Q12H   Continuous Infusions:    Principal Problem:   Withdrawal seizures Active Problems:   Polysubstance abuse   Drug withdrawal   Transaminitis   Depression   GERD (gastroesophageal reflux disease)   Hepatitis C   Unspecified constipation   UTI (urinary tract infection)    Time spent: 35 minutes    THOMPSON,DANIEL M.D. [facility] Pager [phone]. If 7PM-7AM, please contact night-coverage at [email or web address], password [email or web address] 08/28/2013, 10:16 AM  LOS: 4 days

## 2013-08-29 DIAGNOSIS — N3000 Acute cystitis without hematuria: Secondary | ICD-10-CM

## 2013-08-29 LAB — HERPES SIMPLEX VIRUS(HSV) DNA BY PCR
HSV 1 DNA: NOT DETECTED
HSV 2 DNA: NOT DETECTED

## 2013-08-29 LAB — GC/CHLAMYDIA PROBE AMP
CT Probe RNA: NEGATIVE
GC Probe RNA: NEGATIVE

## 2013-08-29 MED ORDER — BUSPIRONE HCL 10 MG PO TABS: 20.0000 mg | ORAL_TABLET | Freq: Three times a day (TID) | ORAL | Status: DC

## 2013-08-29 MED ORDER — GABAPENTIN 800 MG PO TABS: 800.0000 mg | ORAL_TABLET | Freq: Four times a day (QID) | ORAL | Status: DC

## 2013-08-29 MED ORDER — CLONAZEPAM 1 MG PO TABS: 1.0000 mg | ORAL_TABLET | Freq: Two times a day (BID) | ORAL | Status: DC

## 2013-08-29 MED ORDER — DULOXETINE HCL 30 MG PO CPEP: 30.0000 mg | ORAL_CAPSULE | Freq: Every day | ORAL | Status: DC

## 2013-08-29 MED ORDER — POLYETHYLENE GLYCOL 3350 17 G PO PACK: 17.0000 g | PACK | Freq: Two times a day (BID) | ORAL | Status: DC

## 2013-08-29 MED ORDER — CEFUROXIME AXETIL 500 MG PO TABS: 500.0000 mg | ORAL_TABLET | Freq: Two times a day (BID) | ORAL | Status: DC

## 2013-08-29 MED ORDER — NICOTINE 7 MG/24HR TD PT24: 7.0000 mg | MEDICATED_PATCH | Freq: Every day | TRANSDERMAL | Status: DC

## 2013-08-29 MED ORDER — SENNOSIDES-DOCUSATE SODIUM 8.6-50 MG PO TABS: 1.0000 | ORAL_TABLET | Freq: Every day | ORAL | Status: DC

## 2013-08-29 NOTE — Progress Notes (Signed)
Clinical Social Work  CSW met with patient at bedside. Patient had friend Octavia Bruckner) present but reports he is a good supportive friend and is agreeable for him to be involved. Patient reports she just spoke with DSS worker and is upset because she missed her visitation with dtr and cannot reschedule. Patient tearful and reports she wants to get custody of dtr again and is hopeful that she can have a visit soon. CSW encouraged patient to remain sober and to continue following her plan through DSS.  Patient agreeable for assistance to schedule appointment at [facility]. CSW spoke with [facility] who was able to schedule appointment on 08/31/13 at 8am at [facility] for medication management with a psychiatrist and Methadone treatment. Patient aware that [facility] will not provide Methadone on first visit so she is agreeable to continue going to [facility] for Methadone until all paperwork is completed at [facility]. Friend (Tim) is agreeable to assist with transportation and patient is aware of Medicaid transportation if needed.  RN, MD, and patient all aware and agreeable to treatment.  CSW is signing off but available if needed.  Gould, Melissa [phone]

## 2013-08-29 NOTE — Discharge Summary (Signed)
Physician Discharge Summary  [name]:[id number] DOB: [birth year] DOA: 08/24/2013  PCP: No primary Yon Schiffman on file.  Admit date: 08/24/2013 Discharge date: 08/29/2013  Time spent: 65 minutes  Recommendations for Outpatient Follow-up:  1. Followup at the [facility] 2. Followup the methadone clinic. 3. Followup with outpatient psychiatry as well as information has been given for resources for opiate detox as outpatient.  Discharge Diagnoses:  Principal Problem:   Withdrawal seizures Active Problems:   Polysubstance abuse   Drug withdrawal   Transaminitis   Depression   GERD (gastroesophageal reflux disease)   Hepatitis C   Unspecified constipation   UTI (urinary tract infection)   Discharge Condition: Stable  Diet recommendation: Regular  Filed Weights   08/24/13 2052  Weight: 65.5 kg (144 lb 6.4 oz)    History of present illness:  [name] is a 28 y.o. female  With a hx of multi-substance abuse and prior withdrawal seizures who presents to ED initially seeking detox from drugs. While in ED, pt noted by staff to have seizure-like activity that was self-limiting, resolved with one dose of ativan. Per staff, no Todd's paralysis or confusion following episode. Given concerns for seizures, hospitalist consulted for consideration for admission.      Hospital Course:  #1 withdrawal seizures  Patient noted to have withdrawal seizures in the emergency room on admission. Patient with no postictal state. Patient stated that had a history of prior seizures after stopping benzos abruptly. Patient initially was placed on Keppra. EEG was obtained which was negative. CT of the head which was done showed no acute abnormalities. Keppra was subsequently discontinued. Patient had no further seizures. Patient was maintained on Klonopin during the hospitalization as well as a clonidine protocol. Patient be discharged in stable and improved condition and is to followup  as outpatient.  #2 polysubstance abuse  Patient was placed on clonidine protocol for opiate detox. Social work and psychiatry saw the patient during the hospitalization. Patient is being given information and resources for outpatient detox and outpatient followup.  #3 chronic pain  Patient had presented into withdrawal. Patient was placed on a clonidine protocol for opiate detoxification. Patient was maintained on a regimen of methadone. Patient was seen by social worker from psychiatry who gave patient information on outpatient resources. Patient will be discharged in stable condition and is to followup at the methadone clinic as outpatient.  #4 transaminitis/Hepatitis C  On admission patient was noted to have elevated LFTs. Patient was hydrated with IV fluids and LFTs trended down during the hospitalization. Acute hepatitis panel c/w hepatitis C. Viral load and genotype pending will need to be followed up upon as outpatient.HIV and RPR were negative. Patient stated she had received vaccines for hep A and Hep B. Abdominal ultrasound with intra-and extrahepatic bile duct dilatation. No signs or symptoms of biliary obstruction. Outpatient f/u with ID.  #5 depression  Stable. Continued on Cymbalta and BuSpar. Patient was seen by psychiatry during the hospitalization. Patient will need outpatient followup. #6 Constipation  Patient complaining of constipation during the hospitalization. Patient was placed on MiraLAX twice a day as well as Senokot. Patient was subsequently given magnesium citrate with good bowel movement. Constipation that resolved by day of discharge. Patient be discharged home on a bowel regimen.  #7 UTI  Urine cultures were c/w E Coli. Sensitivities pending. Patient initially started on IV Rocephin. Patient will be discharged on 3 days of oral ceftin.   Procedures: Abdominal ultrasound 08/25/13  CT Head 08/25/13  EEG 08/25/2013     Consultations: Psychiatry: Dr Elsie SaasJonnalagadda  08/25/13     Discharge Exam: Filed Vitals:   08/29/13 0502  BP: 100/51  Pulse: 62  Temp: 98.1 F (36.7 C)  Resp: 16    General: NAD Cardiovascular: RRR Respiratory: CTAB  Discharge Instructions You were cared for by a hospitalist during your hospital stay. If you have any questions about your discharge medications or the care you received while you were in the hospital after you are discharged, you can call the unit and asked to speak with the hospitalist on call if the hospitalist that took care of you is not available. Once you are discharged, your primary care physician will handle any further medical issues. Please note that NO REFILLS for any discharge medications will be authorized once you are discharged, as it is imperative that you return to your primary care physician (or establish a relationship with a primary care physician if you do not have one) for your aftercare needs so that they can reassess your need for medications and monitor your lab values.      Discharge Instructions   Diet general    Complete by:  As directed      Discharge instructions    Complete by:  As directed   Follow up with PCP in 1 week. Follow up with outpatient psychiatry and at methadone clinic and detox.     Increase activity slowly    Complete by:  As directed             Medication List         busPIRone 10 MG tablet  Commonly known as:  BUSPAR  Take 2 tablets (20 mg total) by mouth 3 (three) times daily.     cefUROXime 500 MG tablet  Commonly known as:  CEFTIN  Take 1 tablet (500 mg total) by mouth 2 (two) times daily with a meal. Take for 3 days then stop.     clonazePAM 1 MG tablet  Commonly known as:  KLONOPIN  Take 1 tablet (1 mg total) by mouth 2 (two) times daily.     DULoxetine 30 MG capsule  Commonly known as:  CYMBALTA  Take 1 capsule (30 mg total) by mouth daily.     gabapentin 800 MG tablet  Commonly known as:  NEURONTIN  Take 1 tablet (800 mg total) by mouth  4 (four) times daily.     methadone 10 MG/ML solution  Commonly known as:  DOLOPHINE  Take 90 mg by mouth daily.     nicotine 7 mg/24hr patch  Commonly known as:  NICODERM CQ - dosed in mg/24 hr  Place 1 patch (7 mg total) onto the skin daily.     polyethylene glycol packet  Commonly known as:  MIRALAX / GLYCOLAX  Take 17 g by mouth 2 (two) times daily. Hold if you develop diarrhea     senna-docusate 8.6-50 MG per tablet  Commonly known as:  Senokot-S  Take 1 tablet by mouth at bedtime. Hold if you develop diarrhea       Allergies  Allergen Reactions  . Narcan [Naloxone]     Interferes with methadone and causes withdrawal   . Suboxone [Buprenorphine Hcl-Naloxone Hcl]    Follow-up Information   Please follow up. (Patient will be called for appointment time for hepatits clinic)       Follow up with [facility]     On  09/20/2013. (@2P /Do not miss appt or you will not be scheduled again/Dr. Rica MoteJegede/bring meds in bottle/$20 co pay)    Contact information:   [address] [place] [address] [phone]      Please follow up. (f/u otpatient psychiatry)       Follow up with [facility]. (Appointment scheduled for Wednesday 6/24 at 8am.)    Contact information:   [address], [place], [place] [address] [phone]       The results of significant diagnostics from this hospitalization (including imaging, microbiology, ancillary and laboratory) are listed below for reference.    Significant Diagnostic Studies: Ct Head Wo Contrast  08/25/2013   CLINICAL DATA:  Seizure and confusion; recent trauma  EXAM: CT HEAD WITHOUT CONTRAST  TECHNIQUE: Contiguous axial images were obtained from the base of the skull through the vertex without intravenous contrast.  COMPARISON:  September 11, 2012  FINDINGS: The ventricles are normal in size and configuration. There is no mass, hemorrhage, extra-axial fluid collection, or midline shift. Gray-white  compartments appear normal. There is no demonstrable acute infarct.  Bony calvarium appears intact. The mastoid air cells are clear. There is deformity in the region of the left mandibular condyle suggesting an old healed fracture, a stable finding. There is opacification of the visualized right maxillary antrum.  IMPRESSION: No intracranial mass, hemorrhage, or acute infarct. Old trauma left mandibular condyle. Right maxillary sinus disease.   Electronically Signed   By: Bretta BangWilliam  Woodruff M.D.   On: 08/25/2013 14:15   [place] Abdomen Limited Ruq  08/25/2013   CLINICAL DATA:  Elevated liver function tests  EXAM: US ABDOMEN LIMITED - RIGHT UPPER QUADRANT  COMPARISON:  None.  FINDINGS: Gallbladder:  No gallstones or wall thickening visualized. No sonographic Murphy sign noted.  Common bile duct:  Diameter: 8.9 mm. No duct stone is seen. The most distal aspect the duct was not visualized.  Liver:  Mild intrahepatic bile duct dilation. Normal parenchymal echogenicity. No mass or focal lesion. Normal liver size. Normal hepatopetal flow in the portal vein.  IMPRESSION: 1. Intra and extrahepatic bile duct dilation. This is abnormal in this patient's age group, but the etiology is unclear. If there are symptoms of biliary obstruction, followup ERCP or MRCP would be recommended. 2. No other abnormalities.   Electronically Signed   By: Amie Portlandavid  Ormond M.D.   On: 08/25/2013 14:04    Microbiology: Recent Results (from the past 240 hour(s))  URINE CULTURE     Status: None   Collection Time    08/27/13 12:36 PM      Result Value Ref Range Status   Specimen Description URINE, CLEAN CATCH   Final   Special Requests NONE   Final   Culture  Setup Time     Final   Value: 08/27/2013 19:01     [facility]formed at [facility]   Colony Count     Final   Value: >=100,000 COLONIES/ML     [facility]formed at [facility]   Culture     Final   Value: ESCHERICHIA COLI     Performed at [facility]   Report Status  PENDING   Incomplete     Labs: Basic Metabolic Panel:  Recent Labs Lab 08/24/13 1707 08/25/13 0727 08/26/13 0330 08/27/13 0341 08/28/13 0558  NA 140 140 142 143 141  K 3.7 3.6* 4.4 4.2 4.0  CL 100 101 107 108 102  CO2 25 25 26 23 28   GLUCOSE 89 80  117* 97 96  BUN 20 24* 28* 10 11  CREATININE 0.71 0.62 0.76 0.52 0.60  CALCIUM 9.8 9.8 9.2 8.8 9.3  MG  --  2.1 2.0  --   --    Liver Function Tests:  Recent Labs Lab 08/24/13 1707 08/25/13 0727 08/26/13 0330 08/27/13 0341 08/28/13 0558  AST 254* 193* 95* 65* 70*  ALT 334* 306* 189* 132* 129*  ALKPHOS 78 80 76 65 71  BILITOT 0.4 0.5 <0.2* <0.2* <0.2*  PROT 8.0 8.2 6.4 5.6* 5.9*  ALBUMIN 4.3 4.3 3.3* 2.8* 3.2*   No results found for this basename: LIPASE, AMYLASE,  in the last 168 hours No results found for this basename: AMMONIA,  in the last 168 hours CBC:  Recent Labs Lab 08/24/13 1707 08/25/13 0727 08/26/13 0330 08/27/13 0341  WBC 3.9* 4.0 3.3* 3.6*  NEUTROABS 2.1  --   --   --   HGB 12.3 13.6 10.7* 10.2*  HCT 34.9* 38.8 31.6* 30.0*  MCV 84.5 85.1 86.8 87.7  PLT 264 244 197 169   Cardiac Enzymes: No results found for this basename: CKTOTAL, CKMB, CKMBINDEX, TROPONINI,  in the last 168 hours BNP: BNP (last 3 results) No results found for this basename: PROBNP,  in the last 8760 hours CBG: No results found for this basename: GLUCAP,  in the last 168 hours     Signed:  [facility] MD [facility] 08/29/2013, 12:10 PM

## 2013-08-30 LAB — URINE CULTURE: Colony Count: >=100000

## 2013-08-30 LAB — HCV RNA QUANT
HCV QUANT: 44831 [IU]/mL — AB (ref <15)
HCV Quantitative Log: 4.65 {Log} — ABNORMAL HIGH (ref <1.18)

## 2013-09-19 ENCOUNTER — Emergency Department (HOSPITAL_COMMUNITY)
Admission: EM | Admit: 2013-09-19 | Discharge: 2013-09-20 | Disposition: A | Discharge status: Home / Self Care | Payer: MEDICAID | Attending: Emergency Medicine | Admitting: Emergency Medicine

## 2013-09-19 ENCOUNTER — Encounter (HOSPITAL_COMMUNITY): Payer: Self-pay | Admitting: Emergency Medicine

## 2013-09-19 DIAGNOSIS — F3289 Other specified depressive episodes: Secondary | ICD-10-CM | POA: Insufficient documentation

## 2013-09-19 DIAGNOSIS — R42 Dizziness and giddiness: Secondary | ICD-10-CM | POA: Insufficient documentation

## 2013-09-19 DIAGNOSIS — F19230 Other psychoactive substance dependence with withdrawal, uncomplicated: Secondary | ICD-10-CM

## 2013-09-19 DIAGNOSIS — R5383 Other fatigue: Secondary | ICD-10-CM

## 2013-09-19 DIAGNOSIS — K219 Gastro-esophageal reflux disease without esophagitis: Secondary | ICD-10-CM | POA: Insufficient documentation

## 2013-09-19 DIAGNOSIS — F329 Major depressive disorder, single episode, unspecified: Secondary | ICD-10-CM | POA: Diagnosis not present

## 2013-09-19 DIAGNOSIS — M129 Arthropathy, unspecified: Secondary | ICD-10-CM | POA: Insufficient documentation

## 2013-09-19 DIAGNOSIS — G40909 Epilepsy, unspecified, not intractable, without status epilepticus: Secondary | ICD-10-CM | POA: Diagnosis not present

## 2013-09-19 DIAGNOSIS — IMO0002 Reserved for concepts with insufficient information to code with codable children: Secondary | ICD-10-CM | POA: Diagnosis not present

## 2013-09-19 DIAGNOSIS — R5381 Other malaise: Secondary | ICD-10-CM | POA: Insufficient documentation

## 2013-09-19 DIAGNOSIS — F17200 Nicotine dependence, unspecified, uncomplicated: Secondary | ICD-10-CM | POA: Insufficient documentation

## 2013-09-19 DIAGNOSIS — R61 Generalized hyperhidrosis: Secondary | ICD-10-CM | POA: Insufficient documentation

## 2013-09-19 DIAGNOSIS — G8929 Other chronic pain: Secondary | ICD-10-CM | POA: Diagnosis not present

## 2013-09-19 DIAGNOSIS — F19939 Other psychoactive substance use, unspecified with withdrawal, unspecified: Secondary | ICD-10-CM | POA: Diagnosis not present

## 2013-09-19 DIAGNOSIS — F32A Depression, unspecified: Secondary | ICD-10-CM | POA: Diagnosis present

## 2013-09-19 DIAGNOSIS — F1910 Other psychoactive substance abuse, uncomplicated: Secondary | ICD-10-CM | POA: Diagnosis not present

## 2013-09-19 DIAGNOSIS — R569 Unspecified convulsions: Secondary | ICD-10-CM

## 2013-09-19 DIAGNOSIS — F411 Generalized anxiety disorder: Secondary | ICD-10-CM | POA: Diagnosis not present

## 2013-09-19 DIAGNOSIS — Z79899 Other long term (current) drug therapy: Secondary | ICD-10-CM | POA: Insufficient documentation

## 2013-09-19 DIAGNOSIS — F19930 Other psychoactive substance use, unspecified with withdrawal, uncomplicated: Secondary | ICD-10-CM

## 2013-09-19 DIAGNOSIS — R112 Nausea with vomiting, unspecified: Secondary | ICD-10-CM | POA: Diagnosis not present

## 2013-09-19 HISTORY — DX: Anxiety disorder, unspecified: F41.9

## 2013-09-19 LAB — COMPREHENSIVE METABOLIC PANEL
ALBUMIN: 4.2 g/dL (ref 3.5–5.2)
ALK PHOS: 64 U/L (ref 39–117)
ALT: 27 U/L (ref 0–35)
AST: 30 U/L (ref 0–37)
Anion gap: 14 (ref 5–15)
BUN: 8 mg/dL (ref 6–23)
CHLORIDE: 102 meq/L (ref 96–112)
CO2: 23 meq/L (ref 19–32)
Calcium: 9.6 mg/dL (ref 8.4–10.5)
Creatinine, Ser: 0.58 mg/dL (ref 0.50–1.10)
GFR calc Af Amer: >90 mL/min (ref >90)
Glucose, Bld: 88 mg/dL (ref 70–99)
POTASSIUM: 3.8 meq/L (ref 3.7–5.3)
Sodium: 139 mEq/L (ref 137–147)
Total Protein: 7.4 g/dL (ref 6.0–8.3)

## 2013-09-19 LAB — RAPID URINE DRUG SCREEN, HOSP PERFORMED
AMPHETAMINES: NOT DETECTED
BARBITURATES: NOT DETECTED
Benzodiazepines: POSITIVE — AB
Cocaine: NOT DETECTED
Opiates: NOT DETECTED
TETRAHYDROCANNABINOL: POSITIVE — AB

## 2013-09-19 LAB — CBC WITH DIFFERENTIAL/PLATELET
BASOS ABS: 0 10*3/uL (ref 0.0–0.1)
BASOS PCT: 0 % (ref 0–1)
Eosinophils Absolute: 0.1 10*3/uL (ref 0.0–0.7)
Eosinophils Relative: 2 % (ref 0–5)
HEMATOCRIT: 34.4 % — AB (ref 36.0–46.0)
Hemoglobin: 12.4 g/dL (ref 12.0–15.0)
Lymphocytes Relative: 37 % (ref 12–46)
Lymphs Abs: 1.7 10*3/uL (ref 0.7–4.0)
MCH: 30 pg (ref 26.0–34.0)
MCHC: 36 g/dL (ref 30.0–36.0)
MCV: 83.3 fL (ref 78.0–100.0)
Monocytes Absolute: 0.2 10*3/uL (ref 0.1–1.0)
Monocytes Relative: 5 % (ref 3–12)
NEUTROS ABS: 2.5 10*3/uL (ref 1.7–7.7)
NEUTROS PCT: 56 % (ref 43–77)
Platelets: 204 10*3/uL (ref 150–400)
RBC: 4.13 MIL/uL (ref 3.87–5.11)
RDW: 12.5 % (ref 11.5–15.5)
WBC: 4.5 10*3/uL (ref 4.0–10.5)

## 2013-09-19 LAB — ETHANOL: Alcohol, Ethyl (B): <11 mg/dL (ref 0–11)

## 2013-09-19 MED ORDER — SODIUM CHLORIDE 0.9 % IV BOLUS (SEPSIS): 1000.0000 mL | Freq: Once | INTRAVENOUS | Status: DC

## 2013-09-19 MED ORDER — ADULT MULTIVITAMIN W/MINERALS CH
1.0000 | ORAL_TABLET | Freq: Every day | ORAL | Status: DC
  Administered 2013-09-19 – 2013-09-20 (×2): 1 via ORAL
  Filled 2013-09-19 (×2): qty 1

## 2013-09-19 MED ORDER — ZOLPIDEM TARTRATE 5 MG PO TABS
5.0000 mg | ORAL_TABLET | Freq: Every evening | ORAL | Status: DC | PRN
  Administered 2013-09-19: 5 mg via ORAL
  Filled 2013-09-19: qty 1

## 2013-09-19 MED ORDER — CLONAZEPAM 0.5 MG PO TABS
1.0000 mg | ORAL_TABLET | Freq: Two times a day (BID) | ORAL | Status: DC
  Administered 2013-09-19 – 2013-09-20 (×2): 1 mg via ORAL
  Filled 2013-09-19 (×2): qty 2

## 2013-09-19 MED ORDER — LORAZEPAM 2 MG/ML IJ SOLN
2.0000 mg | Freq: Once | INTRAMUSCULAR | Status: DC
  Filled 2013-09-19: qty 1

## 2013-09-19 MED ORDER — LORAZEPAM 1 MG PO TABS
2.0000 mg | ORAL_TABLET | Freq: Once | ORAL | Status: AC
  Administered 2013-09-19: 2 mg via ORAL
  Filled 2013-09-19: qty 2

## 2013-09-19 MED ORDER — NICOTINE 7 MG/24HR TD PT24
7.0000 mg | MEDICATED_PATCH | Freq: Every day | TRANSDERMAL | Status: DC
  Administered 2013-09-19 – 2013-09-20 (×2): 7 mg via TRANSDERMAL
  Filled 2013-09-19 (×2): qty 1

## 2013-09-19 MED ORDER — METHADONE HCL 10 MG/ML PO CONC: 90.0000 mg | Freq: Every day | ORAL | Status: DC

## 2013-09-19 MED ORDER — BUSPIRONE HCL 10 MG PO TABS
20.0000 mg | ORAL_TABLET | Freq: Three times a day (TID) | ORAL | Status: DC
  Administered 2013-09-19 – 2013-09-20 (×3): 20 mg via ORAL
  Filled 2013-09-19 (×3): qty 2

## 2013-09-19 MED ORDER — ONDANSETRON 4 MG PO TBDP
4.0000 mg | ORAL_TABLET | Freq: Once | ORAL | Status: AC
  Administered 2013-09-19: 4 mg via ORAL
  Filled 2013-09-19: qty 1

## 2013-09-19 MED ORDER — GABAPENTIN 400 MG PO CAPS
800.0000 mg | ORAL_CAPSULE | Freq: Four times a day (QID) | ORAL | Status: DC
  Administered 2013-09-19 – 2013-09-20 (×4): 800 mg via ORAL
  Filled 2013-09-19 (×6): qty 2

## 2013-09-19 MED ORDER — DULOXETINE HCL 30 MG PO CPEP
30.0000 mg | ORAL_CAPSULE | Freq: Every day | ORAL | Status: DC
  Administered 2013-09-19 – 2013-09-20 (×2): 30 mg via ORAL
  Filled 2013-09-19 (×2): qty 1

## 2013-09-19 MED ORDER — ONDANSETRON HCL 4 MG/2ML IJ SOLN
4.0000 mg | Freq: Once | INTRAMUSCULAR | Status: DC
  Filled 2013-09-19: qty 2

## 2013-09-19 NOTE — ED Notes (Signed)
Unable to obtain IV

## 2013-09-19 NOTE — ED Notes (Signed)
Pt reports to ED to detox for benzodiazepines, pt states she last took benzo 2 days ago. Pt states that last time she detoxed from benzos she had seizures. Pt is diaphoretic, shaking, anxious.

## 2013-09-19 NOTE — ED Provider Notes (Signed)
CSN: [id number]     Arrival date & time 09/19/13  1552 History   First MD Initiated Contact with Patient 09/19/13 1631     Chief Complaint  Patient presents with  . Withdrawal    benzo     (Consider location/radiation/quality/duration/timing/severity/associated sxs/prior Treatment) HPI Comments: Patient presents to the ER for evaluation of benzodiazepine withdrawal. Patient reports that she last used her Xanax 2 days ago. She ran out of her prescription 2 days ago, has not had any since. Patient reports that she has had seizures secondary to withdrawal from benzodiazepines in the past. Patient reports weakness, jitteriness, sweating, nausea, vomiting. She feels extremely anxious. Patient is not homicidal or suicidal.   Past Medical History  Diagnosis Date  . Drug abuse   . GERD (gastroesophageal reflux disease)   . Depression   . Arthritis   . Seizures   . Chronic pain   . Methadone maintenance therapy patient    Past Surgical History  Procedure Laterality Date  . Appendectomy    . Cesarean section    . Mandible fracture surgery     Family History  Problem Relation Age of Onset  . Mental illness Mother   . Mental illness Brother   . Diabetes Maternal Grandmother   . Mental illness Maternal Grandmother   . Hypertension Maternal Grandfather   . Diabetes Paternal Grandmother   . Heart disease Paternal Grandmother    History  Substance Use Topics  . Smoking status: Current Some Day Smoker -- 1.00 packs/day for 5 years    Last Attempt to Quit: 01/13/2011  . Smokeless tobacco: Never Used  . Alcohol Use: Yes   OB History   Grav Para Term Preterm Abortions TAB SAB Ect Mult Living   3 2   1 1          Review of Systems  Constitutional: Positive for diaphoresis and fatigue.  Gastrointestinal: Positive for nausea.  Neurological: Positive for dizziness. Negative for seizures.  Psychiatric/Behavioral: Positive for agitation. The patient is nervous/anxious.   All other  systems reviewed and are negative.     Allergies  Narcan and Suboxone  Home Medications   Prior to Admission medications   Medication Sig Start Date End Date Taking? Authorizing Provider  busPIRone (BUSPAR) 10 MG tablet Take 2 tablets (20 mg total) by mouth 3 (three) times daily. 08/29/13  Yes Rodolph Bong, MD  clonazePAM (KLONOPIN) 1 MG tablet Take 1 tablet (1 mg total) by mouth 2 (two) times daily. 08/29/13  Yes Rodolph Bong, MD  DULoxetine (CYMBALTA) 30 MG capsule Take 1 capsule (30 mg total) by mouth daily. 08/29/13  Yes Rodolph Bong, MD  gabapentin (NEURONTIN) 800 MG tablet Take 1 tablet (800 mg total) by mouth 4 (four) times daily. 08/29/13  Yes Rodolph Bong, MD  meclizine (ANTIVERT) 25 MG tablet Take 25 mg by mouth 3 (three) times daily as needed for dizziness or nausea.   Yes Historical Provider, MD  methadone (DOLOPHINE) 10 MG/ML solution Take 90 mg by mouth daily.   Yes Historical Provider, MD  Multiple Vitamin (MULTIVITAMIN WITH MINERALS) TABS tablet Take 1 tablet by mouth daily.   Yes Historical Provider, MD  nicotine (NICODERM CQ - DOSED IN MG/24 HR) 7 mg/24hr patch Place 1 patch (7 mg total) onto the skin daily. 08/29/13  Yes Rodolph Bong, MD  polyethylene glycol [facility] / GLYCOLAX) packet Take 17 g by mouth 2 (two) times daily. Hold if you develop diarrhea 08/29/13  Yes Rodolph Bong, MD  senna-docusate (SENOKOT-S) 8.6-50 MG per tablet Take 1 tablet by mouth at bedtime. Hold if you develop diarrhea 08/29/13  Yes Rodolph Bong, MD   BP 120/76  Pulse 81  Temp(Src) 98.3 F (36.8 C) (Oral)  Resp 16  SpO2 100%  LMP 08/29/2013 Physical Exam  Constitutional: She is oriented to person, place, and time. She appears well-developed and well-nourished. No distress.  HENT:  Head: Normocephalic and atraumatic.  Right Ear: Hearing normal.  Left Ear: Hearing normal.  Nose: Nose normal.  Mouth/Throat: Oropharynx is clear and moist and mucous membranes are  normal.  Eyes: Conjunctivae and EOM are normal. Pupils are equal, round, and reactive to light.  Neck: Normal range of motion. Neck supple.  Cardiovascular: Regular rhythm, S1 normal and S2 normal.  Exam reveals no gallop and no friction rub.   No murmur heard. Pulmonary/Chest: Effort normal and breath sounds normal. No respiratory distress. She exhibits no tenderness.  Abdominal: Soft. Normal appearance and bowel sounds are normal. There is no hepatosplenomegaly. There is no tenderness. There is no rebound, no guarding, no tenderness at McBurney's point and negative Murphy's sign. No hernia.  Musculoskeletal: Normal range of motion.  Neurological: She is alert and oriented to person, place, and time. She has normal strength. No cranial nerve deficit or sensory deficit. Coordination normal. GCS eye subscore is 4. GCS verbal subscore is 5. GCS motor subscore is 6.  Skin: Skin is warm, dry and intact. No rash noted. No cyanosis.  Psychiatric: Her speech is normal and behavior is normal. Thought content normal. She exhibits a depressed mood. She expresses no homicidal and no suicidal ideation. She expresses no suicidal plans and no homicidal plans.    ED Course  Procedures (including critical care time) Labs Review Labs Reviewed  CBC WITH DIFFERENTIAL - Abnormal; Notable for the following:    HCT 34.4 (*)    All other components within normal limits  COMPREHENSIVE METABOLIC PANEL - Abnormal; Notable for the following:    Total Bilirubin <0.2 (*)    All other components within normal limits  URINE RAPID DRUG SCREEN (HOSP PERFORMED) - Abnormal; Notable for the following:    Benzodiazepines POSITIVE (*)    Tetrahydrocannabinol POSITIVE (*)    All other components within normal limits  ETHANOL    Imaging Review No results found.   EKG Interpretation   Date/Time:  Monday September 19 2013 16:21:20 EDT Ventricular Rate:  75 PR Interval:  185 QRS Duration: 103 QT Interval:  412 QTC  Calculation: 460 R Axis:   57 Text Interpretation:  Sinus rhythm RSR' in V1 or V2, right VCD or RVH No  significant change since last tracing Confirmed by POLLINA  MD,  CHRISTOPHER [phone]) on 09/19/2013 4:32:18 PM      MDM   Final diagnoses:  None  Benzodiazepine Withdrawal  Patient presents to the ER for evaluation for benzodiazepine withdrawal. Patient reports having been on Klonopin and Xanax in the past. She was recently hospitalized, discharged in with a 10 day supply of Klonopin. She reports that supply ran out 2 days ago, has not had any meds since. Patient comes in with symptoms consistent with mild withdrawal. She does report history of seizure secondary to benzodiazepine withdrawal. Remainder of her workup unremarkable. Patient has a significant history of depression as well as drug abuse. Will ask your help evaluate the patient for benzodiazepine withdrawal. Was administered IV fluids and Ativan here in the ER.  Gilda Creasehristopher J. Pollina, MD 09/19/13 [phone]

## 2013-09-19 NOTE — ED Notes (Addendum)
IV team RN reports that she is unable to obtain IV access.

## 2013-09-20 ENCOUNTER — Emergency Department (HOSPITAL_COMMUNITY): Payer: MEDICAID

## 2013-09-20 ENCOUNTER — Encounter (HOSPITAL_COMMUNITY): Payer: Self-pay | Admitting: *Deleted

## 2013-09-20 MED ORDER — NICOTINE 21 MG/24HR TD PT24
21.0000 mg | MEDICATED_PATCH | Freq: Every day | TRANSDERMAL | Status: DC
  Administered 2013-09-20: 21 mg via TRANSDERMAL
  Filled 2013-09-20: qty 1

## 2013-09-20 MED ORDER — NICOTINE 21 MG/24HR TD PT24: 21.0000 mg | MEDICATED_PATCH | Freq: Every day | TRANSDERMAL | Status: DC

## 2013-09-20 MED ORDER — METHADONE HCL 10 MG PO TABS
90.0000 mg | ORAL_TABLET | Freq: Every day | ORAL | Status: DC
  Administered 2013-09-20: 90 mg via ORAL
  Filled 2013-09-20: qty 9

## 2013-09-20 MED ORDER — IBUPROFEN 200 MG PO TABS
600.0000 mg | ORAL_TABLET | Freq: Once | ORAL | Status: AC
  Administered 2013-09-20: 600 mg via ORAL
  Filled 2013-09-20: qty 3

## 2013-09-20 MED ORDER — ACETAMINOPHEN 325 MG PO TABS
650.0000 mg | ORAL_TABLET | ORAL | Status: DC | PRN
  Administered 2013-09-20: 650 mg via ORAL
  Filled 2013-09-20: qty 2

## 2013-09-20 NOTE — ED Notes (Signed)
Pt wanded via security; placed in blue scrubs; all belongings placed in locker 28; pocket book and clothes placed in one bag

## 2013-09-20 NOTE — ED Notes (Signed)
PT PASSWORD IS [phone]

## 2013-09-20 NOTE — ED Notes (Signed)
Boyfriend at bedside

## 2013-09-20 NOTE — ED Notes (Signed)
Pt was informed via TTS coordinator that patient had been discharged approx 1535; pt became very upset, verbally yelling at staff stating that "yall wont help me at all" "yall aren't doing anything for me." Staff continued to try to explain that with the patients current charges staff was unable to find outpatient programs due to current [facility]; when counselor left room patient stated "i am gonna leave here and blow my f-in head off." Notified the DenhamShavon, NP, who suggested calling her probation officer and informing her of the situation. Upon speaking with the patient she is still emotionally upset about not getting prescriptions or the help she feels she needs. She did willing giver her probation officer name, Lenor Derrickiffany Brown and phone number [phone]. Officer Manson PasseyBrown made aware and spoke with patient at this time. Will let MD know of what is going on at this time.

## 2013-09-20 NOTE — BH Assessment (Signed)
Tele Assessment Note   [name] is a 28 y.o. female who presents to [facility] for detox. Pt denies SI/HI/AVH.  Pt says she is prescribed klonopin for anxiety and has been abusing the substance, taking 6 pills a day. Pt says when klonopin is not avail, she takes 6 xanax pills a day.  Pt is tearful during assessment, stating that she used [facility] for 1st time in 45 days(09/19/13) and she feels guilty because she has a daughter she needs to care for and is unable to because of her addiction.  Pt has a court date 09/20/13 for Robbery and another court date on 09/30/13 for driving w/o a license and and DUI.  Pt c/o w/d sxs: restless legs, back aches, diarrhea, and sweats.  Pt does have w/d seizures, last episode was June 2015.     Axis I: Sedative, hypnotic, or anxiolytic use disorder, Moderate Axis II: Deferred Axis III:  Past Medical History  Diagnosis Date  . Drug abuse   . GERD (gastroesophageal reflux disease)   . Depression   . Arthritis   . Seizures   . Chronic pain   . Methadone maintenance therapy patient   . Anxiety    Axis IV: other psychosocial or environmental problems, problems related to legal system/crime, problems related to social environment and problems with primary support group Axis V: 41-50 serious symptoms  Past Medical History:  Past Medical History  Diagnosis Date  . Drug abuse   . GERD (gastroesophageal reflux disease)   . Depression   . Arthritis   . Seizures   . Chronic pain   . Methadone maintenance therapy patient   . Anxiety     Past Surgical History  Procedure Laterality Date  . Appendectomy    . Cesarean section    . Mandible fracture surgery      Family History:  Family History  Problem Relation Age of Onset  . Mental illness Mother   . Mental illness Brother   . Diabetes Maternal Grandmother   . Mental illness Maternal Grandmother   . Hypertension Maternal Grandfather   . Diabetes Paternal Grandmother   . Heart disease Paternal  Grandmother     Social History:  reports that she has been smoking.  She has never used smokeless tobacco. She reports that she drinks alcohol. She reports that she uses illicit drugs (Heroin, Marijuana, and Benzodiazepines).  Additional Social History:  Alcohol / Drug Use Pain Medications: See MAR  Prescriptions: See MAR  Over the Counter: See MAR  History of alcohol / drug use?: Yes Longest period of sobriety (when/how long): When in detox  Negative Consequences of Use: Work / School;Personal relationships;Legal;Financial Withdrawal Symptoms: Diarrhea;Sweats;Other (Comment) (Body Aches, Restless Leg ) Substance #1 Name of Substance 1: Benzodiazepines  1 - Age of First Use: 20's  1 - Amount (size/oz): 6 Pills (klonopins or xanax) 1 - Frequency: Daily  1 - Duration: On-going  1 - Last Use / Amount: 09/19/13 Substance #2 Name of Substance 2: THC  2 - Age of First Use: Teens  2 - Amount (size/oz): 1 Blunt  2 - Frequency: Daily  2 - Duration: 1 Day 2 - Last Use / Amount: 09/19/13  CIWA: CIWA-Ar BP: 122/54 mmHg Pulse Rate: 78 COWS:    Allergies:  Allergies  Allergen Reactions  . Narcan [Naloxone]     Interferes with methadone and causes withdrawal   . Suboxone [Buprenorphine Hcl-Naloxone Hcl]     Home Medications:  (Not in a  hospital admission)  OB/GYN Status:  Patient's last menstrual period was 08/29/2013.  General Assessment Data Location of Assessment: [facility] ED Is this a Tele or Face-to-Face Assessment?: Face-to-Face Is this an Initial Assessment or a Re-assessment for this encounter?: Initial Assessment Living Arrangements: Alone Can pt return to current living arrangement?: Yes Admission Status: Voluntary Is patient capable of signing voluntary admission?: Yes Transfer from: Acute Hospital Referral Source: MD  Medical Screening Exam [facility] ONLY) Medical Exam completed: No Reason for MSE not completed: Other: (None )  [facility] Crisis Care Plan Living  Arrangements: Alone Name of Psychiatrist: None  Name of Therapist: None   Education Status Is patient currently in school?: No Current Grade: None  Highest grade of school patient has completed: None  Name of school: None  Contact person: None   Risk to self Suicidal Ideation: No Suicidal Intent: No Is patient at risk for suicide?: No Suicidal Plan?: No Access to Means: No What has been your use of drugs/alcohol within the last 12 months?: Abusing: benzos; past hx of IV drug use(heroin)  Previous Attempts/Gestures: Yes How many times?: 1 Other Self Harm Risks: None  Triggers for Past Attempts: Family contact Intentional Self Injurious Behavior: None Family Suicide History: No Recent stressful life event(s): Legal Issues;Financial Problems ([place]) Persecutory voices/beliefs?: No Depression: Yes Depression Symptoms: Tearfulness;Loss of interest in usual pleasures;Feeling worthless/self pity Substance abuse history and/or treatment for substance abuse?: Yes Suicide prevention information given to non-admitted patients: Not applicable  Risk to Others Homicidal Ideation: No Thoughts of Harm to Others: No Current Homicidal Intent: No Current Homicidal Plan: No-Not Currently/Within Last 6 Months Access to Homicidal Means: No Identified Victim: None  History of harm to others?: No Assessment of Violence: None Noted Violent Behavior Description: None  Does patient have access to weapons?: No Criminal Charges Pending?: Yes Describe Pending Criminal Charges: Robbery  Does patient have a court date: Yes Court Date: 09/20/13  Psychosis Hallucinations: None noted Delusions: None noted  Mental Status Report Appear/Hygiene: Disheveled Eye Contact: Good Motor Activity: Unremarkable Speech: Logical/coherent;Pressured Level of Consciousness: Alert Mood: Depressed Affect: Depressed Anxiety Level: Minimal Thought Processes: Coherent;Relevant Judgement: Unimpaired Orientation:  Person;Place;Time;Situation Obsessive Compulsive Thoughts/Behaviors: None  Cognitive Functioning Concentration: Normal Memory: Recent Intact;Remote Intact IQ: Average Insight: Fair Impulse Control: Fair Appetite: Good Weight Loss: 0 Weight Gain: 0 Sleep: Decreased Total Hours of Sleep: 5 Vegetative Symptoms: None  ADLScreening [facility] Assessment Services) Patient's cognitive ability adequate to safely complete daily activities?: Yes Patient able to express need for assistance with ADLs?: Yes Independently performs ADLs?: Yes (appropriate for developmental age)  Prior Inpatient Therapy Prior Inpatient Therapy: Yes Prior Therapy Dates: 2014 Prior Therapy Facilty/Provider(s): [facility]  Reason for Treatment: Detox   Prior Outpatient Therapy Prior Outpatient Therapy: No Prior Therapy Dates: None  Prior Therapy Facilty/Provider(s): None  Reason for Treatment: None   ADL Screening (condition at time of admission) Patient's cognitive ability adequate to safely complete daily activities?: Yes Is the patient deaf or have difficulty hearing?: No Does the patient have difficulty seeing, even when wearing glasses/contacts?: No Does the patient have difficulty concentrating, remembering, or making decisions?: No Patient able to express need for assistance with ADLs?: Yes Does the patient have difficulty dressing or bathing?: No Independently performs ADLs?: Yes (appropriate for developmental age) Does the patient have difficulty walking or climbing stairs?: No Weakness of Legs: None  Home Assistive Devices/Equipment Home Assistive Devices/Equipment: None  Therapy Consults (therapy consults require a physician order) PT Evaluation  Needed: No OT Evalulation Needed: No SLP Evaluation Needed: No Abuse/Neglect Assessment (Assessment to be complete while patient is alone) Physical Abuse: Denies Verbal Abuse: Denies Sexual Abuse: Denies Exploitation of  patient/patient's resources: Denies Self-Neglect: Denies Values / Beliefs Cultural Requests During Hospitalization: None Spiritual Requests During Hospitalization: None Consults Spiritual Care Consult Needed: No Social Work Consult Needed: No Merchant navy officerAdvance Directives (For Healthcare) Advance Directive: Patient does not have advance directive;Patient would not like information Pre-existing out of facility DNR order (yellow form or pink MOST form): No Nutrition Screen- [facility] Patient's home diet: Regular  Additional Information 1:1 In Past 12 Months?: No CIRT Risk: No Elopement Risk: No Does patient have medical clearance?: Yes     Disposition:  Disposition Initial Assessment Completed for this Encounter: Yes Disposition of Patient: Referred to (Per Donell SievertSpencer Simon, PA, recommend outpt tx ) Patient referred to: Other (Comment) (Per Donell SievertSpencer Simon, PA recommend outpt tx )  Murrell ReddenSimmons, Liela Rylee C 09/20/2013 2:34 AM

## 2013-09-20 NOTE — [facility] Suicide Risk Assessment (Cosign Needed)
Suicide Risk Assessment  Discharge Assessment     Demographic Factors:  Caucasian and Female  Total Time spent with patient: 30 minutes Psychiatric Specialty Exam:      Blood pressure 106/49, pulse 69, temperature 98.3 F (36.8 C), temperature source Oral, resp. rate 18, last menstrual period 08/29/2013, SpO2 99.00%.There is no weight on file to calculate BMI.   General Appearance: Casual   Eye Contact:: Good   Speech: Clear and Coherent and Normal Rate   Volume: Normal   Mood: Anxious   Affect: Congruent   Thought Process: Circumstantial   Orientation: Full (Time, Place, and Person)   Thought Content: Rumination   Suicidal Thoughts: No   Homicidal Thoughts: No   Memory: Immediate; Good  Recent; Good  Remote; Good   Judgement: Fair   Insight: Shallow   Psychomotor Activity: Normal   Concentration: Fair   Recall: Good   Fund of Knowledge:Good   Language: Good   Akathisia: No   Handed: Right   AIMS (if indicated):   Assets: Communication Skills  Desire for Improvement  Housing  Social Support   Sleep:   Musculoskeletal:  Strength & Muscle Tone: within normal limits  Gait & Station: normal  Patient leans: N/A    Mental Status Per Nursing Assessment::   On Admission:     Current Mental Status by Physician: Patient denies suicidal/homicidal ideation, psychosis, and paranoia  Loss Factors: NA  Historical Factors: NA  Risk Reduction Factors:   NA  Continued Clinical Symptoms:  Alcohol/Substance Abuse/Dependencies  Cognitive Features That Contribute To Risk:  Closed-mindedness    Suicide Risk:  Minimal: No identifiable suicidal ideation.  Patients presenting with no risk factors but with morbid ruminations; may be classified as minimal risk based on the severity of the depressive symptoms  Discharge Diagnoses:  AXIS I: Substance Abuse and Substance Induced Mood Disorder  AXIS II: Deferred  AXIS III:  Past Medical History   Diagnosis  Date   .   Drug abuse    .  GERD (gastroesophageal reflux disease)    .  Depression    .  Arthritis    .  Seizures    .  Chronic pain    .  Methadone maintenance therapy patient    .  Anxiety     AXIS IV: other psychosocial or environmental problems  AXIS V: 61-70 mild symptoms      Plan Of Care/Follow-up recommendations:  Activity:  Resume usual activity Diet:  Resume usual diet  Is patient on multiple antipsychotic therapies at discharge:  No   Has Patient had three or more failed trials of antipsychotic monotherapy by history:  No  Recommended Plan for Multiple Antipsychotic Therapies: NA   Jayjay Littles, FNP-BC 09/20/2013, 3:29 PM

## 2013-09-20 NOTE — Discharge Instructions (Signed)
Depression, Adult Depression refers to feeling sad, low, down in the dumps, blue, gloomy, or empty. In general, there are two kinds of depression: 1. Depression that we all experience from time to time because of upsetting life experiences, including the loss of a job or the ending of a relationship (normal sadness or normal grief). This kind of depression is considered normal, is short lived, and resolves within a few days to 2 weeks. (Depression experienced after the loss of a loved one is called bereavement. Bereavement often lasts longer than 2 weeks but normally gets better with time.) 2. Clinical depression, which lasts longer than normal sadness or normal grief or interferes with your ability to function at home, at work, and in school. It also interferes with your personal relationships. It affects almost every aspect of your life. Clinical depression is an illness. Symptoms of depression also can be caused by conditions other than normal sadness and grief or clinical depression. Examples of these conditions are listed as follows:  Physical illness--Some physical illnesses, including underactive thyroid gland (hypothyroidism), severe anemia, specific types of cancer, diabetes, uncontrolled seizures, heart and lung problems, strokes, and chronic pain are commonly associated with symptoms of depression.  Side effects of some prescription medicine--In some people, certain types of prescription medicine can cause symptoms of depression.  Substance abuse--Abuse of alcohol and illicit drugs can cause symptoms of depression. SYMPTOMS Symptoms of normal sadness and normal grief include the following:  Feeling sad or crying for short periods of time.  Not caring about anything (apathy).  Difficulty sleeping or sleeping too much.  No longer able to enjoy the things you used to enjoy.  Desire to be by oneself all the time (social isolation).  Lack of energy or motivation.  Difficulty  concentrating or remembering.  Change in appetite or weight.  Restlessness or agitation. Symptoms of clinical depression include the same symptoms of normal sadness or normal grief and also the following symptoms:  Feeling sad or crying all the time.  Feelings of guilt or worthlessness.  Feelings of hopelessness or helplessness.  Thoughts of suicide or the desire to harm yourself (suicidal ideation).  Loss of touch with reality (psychotic symptoms). Seeing or hearing things that are not real (hallucinations) or having false beliefs about your life or the people around you (delusions and paranoia). DIAGNOSIS  The diagnosis of clinical depression usually is based on the severity and duration of the symptoms. Your caregiver also will ask you questions about your medical history and substance use to find out if physical illness, use of prescription medicine, or substance abuse is causing your depression. Your caregiver also may order blood tests. TREATMENT  Typically, normal sadness and normal grief do not require treatment. However, sometimes antidepressant medicine is prescribed for bereavement to ease the depressive symptoms until they resolve. The treatment for clinical depression depends on the severity of your symptoms but typically includes antidepressant medicine, counseling with a mental health professional, or a combination of both. Your caregiver will help to determine what treatment is best for you. Depression caused by physical illness usually goes away with appropriate medical treatment of the illness. If prescription medicine is causing depression, talk with your caregiver about stopping the medicine, decreasing the dose, or substituting another medicine. Depression caused by abuse of alcohol or illicit drugs abuse goes away with abstinence from these substances. Some adults need professional help in order to stop drinking or using drugs. SEEK IMMEDIATE CARE IF:  You have thoughts  about  hurting yourself or others.  You lose touch with reality (have psychotic symptoms).  You are taking medicine for depression and have a serious side effect. FOR MORE INFORMATION National Alliance on Mental Illness: www.nami.[employer] of Mental [employer]: https://carter.com/ Document Released: 02/22/2000 Document Revised: 08/26/2011 Document Reviewed: 05/26/2011 [facility] Patient Information 2015 [place], [place]. This information is not intended to replace advice given to you by your health care provider. Make sure you discuss any questions you have with your health care provider.  Drug Abuse and Addiction in Sports There are many types of drugs that one may become addicted to including illegal drugs (marijuana, cocaine, amphetamines, hallucinogens, and narcotics), prescription drugs (hydrocodone, codeine, and alprazolam), and other chemicals such as alcohol or nicotine. Two types of addiction exist: physical and emotional. Physical addiction usually occurs after prolonged use of a drug. However, some drugs may only take a couple uses before addiction can occur. Physical addiction is marked by withdrawal symptoms, in which the person experiences negative symptoms such as sweat, anxiety, tremors, hallucinations, or cravings in the absence of using the drug. Emotional dependence is the psychological desire for the "high" that the drugs produce when taken. SYMPTOMS   Inattentiveness.  Negligence.  Forgetfulness.  Insomnia.  Mood swings. RISK INCREASES WITH:   Family history of addiction.  Personal history of addictive personality. Studies have shown that risktakers, which many athletes are, have a higher risk of addiction. PREVENTION The only adequate prevention of drug abuse is abstinence from drugs. TREATMENT  The first step in quitting substance abuse is recognizing the problem and realizing that one has the power to change. Quitting requires a plan and support from  others. It is often necessary to seek medical assistance. Caregivers are available to offer counseling, and for certain cases, medicine to diminish the physical symptoms of withdrawal. Many organizations exist such as Alcoholics Anonymous, Narcotics Anonymous, or the [employer] on Alcoholism that offer support for individuals who have chosen to quit their habits. Document Released: 02/24/2005 Document Revised: 05/19/2011 Document Reviewed: 06/08/2008 [facility] Patient Information 2015 [place], [place]. This information is not intended to replace advice given to you by your health care provider. Make sure you discuss any questions you have with your health care provider.  Mood Disorders Mood disorders are conditions that affect the way a person feels emotionally. The main mood disorders include:  Depression.  Bipolar disorder.  Dysthymia. Dysthymia is a mild, lasting (chronic) depression. Symptoms of dysthymia are similar to depression, but not as severe.  Cyclothymia. Cyclothymia includes mood swings, but the highs and lows are not as severe as they are in bipolar disorder. Symptoms of cyclothymia are similar to those of bipolar disorder, but less extreme. CAUSES  Mood disorders are probably caused by a combination of factors. People with mood disorders seem to have physical and chemical changes in their brains. Mood disorders run in families, so there may be genetic causes. Severe trauma or stressful life events may also increase the risk of mood disorders.  SYMPTOMS  Symptoms of mood disorders depend on the specific type of condition. Depression symptoms include:  Feeling sad, worthless, or hopeless.  Negative thoughts.  Inability to enjoy one's usual activities.  Low energy.  Sleeping too much or too little.  Appetite changes.  Crying.  Concentration problems.  Thoughts of harming oneself. Bipolar disorder symptoms include:  Periods of depression (see above  symptoms).  Mood swings, from sadness and depression, to abnormal elation and excitement.  Periods of mania:  Racing thoughts.  Fast speech.  Poor judgment, and careless, dangerous choices.  Decreased need for sleep.  Risky behavior.  Difficulty concentrating.  Irritability.  Increased energy.  Increased sex drive. DIAGNOSIS  There are no blood tests or X-rays that can confirm a mood disorder. However, your caregiver may choose to run some tests to make sure that there is not another physical cause for your symptoms. A mood disorder is usually diagnosed after an in-depth interview with a caregiver. TREATMENT  Mood disorders can be treated with one or more of the following:  Medicine. This may include antidepressants, mood-stabilizers, or anti-psychotics.  Psychotherapy (talk therapy).  Cognitive behavioral therapy. You are taught to recognize negative thoughts and behavior patterns, and replace them with healthy thoughts and behaviors.  Electroconvulsive therapy. For very severe cases of deep depression, a series of treatments in which an electrical current is applied to the brain.  Vagus nerve stimulation. A pulse of electricity is applied to a portion of the brain.  Transcranial magnetic stimulation. Powerful magnets are placed on the head that produce electrical currents.  Hospitalization. In severe situations, or when someone is having serious thoughts of harming him or herself, hospitalization may be necessary in order to keep the person safe. This is also done to quickly start and monitor treatment. HOME CARE INSTRUCTIONS   Take your medicine exactly as directed.  Attend all of your therapy sessions.  Try to eat regular, healthy meals.  Exercise daily. Exercise may improve mood symptoms.  Get good sleep.  Do not drink alcohol or use pot or other drugs. These can worsen mood symptoms and cause anxiety and psychosis.  Tell your caregiver if you develop any  side effects, such as feeling sick to your stomach (nauseous), dry mouth, dizziness, constipation, drowsiness, tremor, weight gain, or sexual symptoms. He or she may suggest things you can do to improve symptoms.  Learn ways to cope with the stress of having a chronic illness. This includes yoga, meditation, tai chi, or participating in a support group.  Drink enough water to keep your urine clear or pale yellow. Eat a high-fiber diet. These habits may help you avoid constipation from your medicine. SEEK IMMEDIATE MEDICAL CARE IF:  Your mood worsens.  You have thoughts of hurting yourself or others.  You cannot care for yourself.  You develop the sensation of hearing or seeing something that is not actually present (auditory or visual hallucinations).  You develop abnormal thoughts. Document Released: 12/22/2008 Document Revised: 05/19/2011 Document Reviewed: 12/22/2008 [facility] Patient Information 2015 [place], [place]. This information is not intended to replace advice given to you by your health care provider. Make sure you discuss any questions you have with your health care provider.

## 2013-09-20 NOTE — ED Notes (Signed)
MD at bedside. 

## 2013-09-20 NOTE — ED Notes (Signed)
Pt still very emotional upset about not getting the help she felt she needed; pt had to escorted off property by [employer]; belongings provided to patient and officer; did call officer Lenor Derrickiffany Brown to come pick her up per [employer] request; number [phone]
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
## 2013-09-24 DIAGNOSIS — F1510 Other stimulant abuse, uncomplicated: Secondary | ICD-10-CM | POA: Insufficient documentation

## 2013-09-24 DIAGNOSIS — F1310 Sedative, hypnotic or anxiolytic abuse, uncomplicated: Secondary | ICD-10-CM

## 2013-09-24 DIAGNOSIS — F1390 Sedative, hypnotic, or anxiolytic use, unspecified, uncomplicated: Secondary | ICD-10-CM | POA: Insufficient documentation

## 2013-09-24 DIAGNOSIS — F1210 Cannabis abuse, uncomplicated: Secondary | ICD-10-CM | POA: Insufficient documentation

## 2013-09-29 ENCOUNTER — Ambulatory Visit: Payer: Self-pay | Admitting: Internal Medicine

## 2014-01-09 ENCOUNTER — Encounter (HOSPITAL_COMMUNITY): Payer: Self-pay | Admitting: *Deleted

## 2014-03-01 ENCOUNTER — Emergency Department (HOSPITAL_COMMUNITY)
Admission: EM | Admit: 2014-03-01 | Discharge: 2014-03-01 | Disposition: A | Discharge status: Other Acute Inpatient Hospital | Payer: MEDICAID | Attending: Emergency Medicine | Admitting: Emergency Medicine

## 2014-03-01 ENCOUNTER — Encounter (HOSPITAL_COMMUNITY): Payer: Self-pay | Admitting: Emergency Medicine

## 2014-03-01 ENCOUNTER — Encounter (HOSPITAL_COMMUNITY): Payer: Self-pay | Admitting: *Deleted

## 2014-03-01 ENCOUNTER — Inpatient Hospital Stay (HOSPITAL_COMMUNITY)
Admission: AD | Admit: 2014-03-01 | Discharge: 2014-03-07 | DRG: 897 | Disposition: A | Discharge status: Home / Self Care | Payer: 59 | Source: Intra-hospital | Attending: Psychiatry | Admitting: Psychiatry

## 2014-03-01 DIAGNOSIS — F3162 Bipolar disorder, current episode mixed, moderate: Secondary | ICD-10-CM | POA: Diagnosis present

## 2014-03-01 DIAGNOSIS — F1320 Sedative, hypnotic or anxiolytic dependence, uncomplicated: Secondary | ICD-10-CM | POA: Diagnosis present

## 2014-03-01 DIAGNOSIS — Z833 Family history of diabetes mellitus: Secondary | ICD-10-CM | POA: Diagnosis not present

## 2014-03-01 DIAGNOSIS — Z609 Problem related to social environment, unspecified: Secondary | ICD-10-CM | POA: Diagnosis present

## 2014-03-01 DIAGNOSIS — F1120 Opioid dependence, uncomplicated: Principal | ICD-10-CM | POA: Diagnosis present

## 2014-03-01 DIAGNOSIS — F4310 Post-traumatic stress disorder, unspecified: Secondary | ICD-10-CM | POA: Diagnosis present

## 2014-03-01 DIAGNOSIS — F13239 Sedative, hypnotic or anxiolytic dependence with withdrawal, unspecified: Secondary | ICD-10-CM | POA: Diagnosis present

## 2014-03-01 DIAGNOSIS — F319 Bipolar disorder, unspecified: Secondary | ICD-10-CM | POA: Insufficient documentation

## 2014-03-01 DIAGNOSIS — Z62810 Personal history of physical and sexual abuse in childhood: Secondary | ICD-10-CM | POA: Diagnosis present

## 2014-03-01 DIAGNOSIS — Z8249 Family history of ischemic heart disease and other diseases of the circulatory system: Secondary | ICD-10-CM

## 2014-03-01 DIAGNOSIS — F419 Anxiety disorder, unspecified: Secondary | ICD-10-CM | POA: Insufficient documentation

## 2014-03-01 DIAGNOSIS — F17210 Nicotine dependence, cigarettes, uncomplicated: Secondary | ICD-10-CM | POA: Diagnosis present

## 2014-03-01 DIAGNOSIS — K219 Gastro-esophageal reflux disease without esophagitis: Secondary | ICD-10-CM | POA: Diagnosis present

## 2014-03-01 DIAGNOSIS — F17200 Nicotine dependence, unspecified, uncomplicated: Secondary | ICD-10-CM | POA: Insufficient documentation

## 2014-03-01 DIAGNOSIS — F13230 Sedative, hypnotic or anxiolytic dependence with withdrawal, uncomplicated: Secondary | ICD-10-CM

## 2014-03-01 DIAGNOSIS — F332 Major depressive disorder, recurrent severe without psychotic features: Secondary | ICD-10-CM | POA: Diagnosis present

## 2014-03-01 DIAGNOSIS — F3160 Bipolar disorder, current episode mixed, unspecified: Secondary | ICD-10-CM | POA: Diagnosis present

## 2014-03-01 DIAGNOSIS — F13930 Sedative, hypnotic or anxiolytic use, unspecified with withdrawal, uncomplicated: Secondary | ICD-10-CM

## 2014-03-01 DIAGNOSIS — G4700 Insomnia, unspecified: Secondary | ICD-10-CM | POA: Diagnosis present

## 2014-03-01 DIAGNOSIS — M1990 Unspecified osteoarthritis, unspecified site: Secondary | ICD-10-CM | POA: Diagnosis present

## 2014-03-01 DIAGNOSIS — F329 Major depressive disorder, single episode, unspecified: Secondary | ICD-10-CM | POA: Diagnosis present

## 2014-03-01 DIAGNOSIS — F1123 Opioid dependence with withdrawal: Secondary | ICD-10-CM

## 2014-03-01 LAB — CBC WITH DIFFERENTIAL/PLATELET
Basophils Absolute: 0 10*3/uL (ref 0.0–0.1)
Basophils Relative: 0 % (ref 0–1)
Eosinophils Absolute: 0 10*3/uL (ref 0.0–0.7)
Eosinophils Relative: 0 % (ref 0–5)
HEMATOCRIT: 34.7 % — AB (ref 36.0–46.0)
Hemoglobin: 12 g/dL (ref 12.0–15.0)
LYMPHS ABS: 1.7 10*3/uL (ref 0.7–4.0)
LYMPHS PCT: 37 % (ref 12–46)
MCH: 29.9 pg (ref 26.0–34.0)
MCHC: 34.6 g/dL (ref 30.0–36.0)
MCV: 86.3 fL (ref 78.0–100.0)
MONO ABS: 0.2 10*3/uL (ref 0.1–1.0)
MONOS PCT: 4 % (ref 3–12)
Neutro Abs: 2.7 10*3/uL (ref 1.7–7.7)
Neutrophils Relative %: 59 % (ref 43–77)
PLATELETS: 302 10*3/uL (ref 150–400)
RBC: 4.02 MIL/uL (ref 3.87–5.11)
RDW: 12.1 % (ref 11.5–15.5)
WBC: 4.6 10*3/uL (ref 4.0–10.5)

## 2014-03-01 LAB — RAPID URINE DRUG SCREEN, HOSP PERFORMED
Amphetamines: NOT DETECTED
BARBITURATES: NOT DETECTED
BENZODIAZEPINES: POSITIVE — AB
COCAINE: NOT DETECTED
OPIATES: NOT DETECTED
Tetrahydrocannabinol: POSITIVE — AB

## 2014-03-01 LAB — COMPREHENSIVE METABOLIC PANEL
ALT: 21 U/L (ref 0–35)
ANION GAP: 8 (ref 5–15)
AST: 23 U/L (ref 0–37)
Albumin: 5.1 g/dL (ref 3.5–5.2)
Alkaline Phosphatase: 56 U/L (ref 39–117)
BUN: 22 mg/dL (ref 6–23)
CO2: 26 mmol/L (ref 19–32)
CREATININE: 0.61 mg/dL (ref 0.50–1.10)
Calcium: 9.6 mg/dL (ref 8.4–10.5)
Chloride: 102 mEq/L (ref 96–112)
Glucose, Bld: 102 mg/dL — ABNORMAL HIGH (ref 70–99)
Potassium: 3.7 mmol/L (ref 3.5–5.1)
Sodium: 136 mmol/L (ref 135–145)
TOTAL PROTEIN: 8.8 g/dL — AB (ref 6.0–8.3)
Total Bilirubin: 0.5 mg/dL (ref 0.3–1.2)

## 2014-03-01 LAB — ETHANOL: Alcohol, Ethyl (B): <5 mg/dL (ref 0–9)

## 2014-03-01 MED ORDER — MECLIZINE HCL 25 MG PO TABS
25.0000 mg | ORAL_TABLET | Freq: Three times a day (TID) | ORAL | Status: DC | PRN
  Filled 2014-03-01: qty 1

## 2014-03-01 MED ORDER — CLONIDINE HCL 0.1 MG PO TABS: 0.1000 mg | ORAL_TABLET | ORAL | Status: DC

## 2014-03-01 MED ORDER — NICOTINE 21 MG/24HR TD PT24
21.0000 mg | MEDICATED_PATCH | Freq: Every day | TRANSDERMAL | Status: DC
  Administered 2014-03-01 – 2014-03-05 (×5): 21 mg via TRANSDERMAL
  Filled 2014-03-01 (×7): qty 1

## 2014-03-01 MED ORDER — GABAPENTIN 300 MG PO CAPS
600.0000 mg | ORAL_CAPSULE | Freq: Four times a day (QID) | ORAL | Status: DC
  Administered 2014-03-01 – 2014-03-07 (×23): 600 mg via ORAL
  Filled 2014-03-01 (×31): qty 2

## 2014-03-01 MED ORDER — LORAZEPAM 1 MG PO TABS
1.0000 mg | ORAL_TABLET | Freq: Four times a day (QID) | ORAL | Status: DC
  Administered 2014-03-01 – 2014-03-02 (×3): 1 mg via ORAL
  Filled 2014-03-01 (×3): qty 1

## 2014-03-01 MED ORDER — TRAZODONE HCL 50 MG PO TABS
50.0000 mg | ORAL_TABLET | Freq: Every evening | ORAL | Status: DC | PRN
  Administered 2014-03-01 – 2014-03-02 (×2): 50 mg via ORAL
  Filled 2014-03-01 (×2): qty 1

## 2014-03-01 MED ORDER — ALUM & MAG HYDROXIDE-SIMETH 200-200-20 MG/5ML PO SUSP: 30.0000 mL | ORAL | Status: DC | PRN

## 2014-03-01 MED ORDER — ADULT MULTIVITAMIN W/MINERALS CH
1.0000 | ORAL_TABLET | Freq: Every day | ORAL | Status: DC
  Administered 2014-03-01 – 2014-03-02 (×2): 1 via ORAL
  Filled 2014-03-01 (×5): qty 1

## 2014-03-01 MED ORDER — NAPROXEN 500 MG PO TABS
500.0000 mg | ORAL_TABLET | Freq: Two times a day (BID) | ORAL | Status: DC | PRN
  Administered 2014-03-01: 500 mg via ORAL
  Filled 2014-03-01: qty 1

## 2014-03-01 MED ORDER — VITAMIN B-1 100 MG PO TABS
100.0000 mg | ORAL_TABLET | Freq: Every day | ORAL | Status: DC
  Administered 2014-03-02: 100 mg via ORAL
  Filled 2014-03-01 (×3): qty 1

## 2014-03-01 MED ORDER — HYDROXYZINE HCL 25 MG PO TABS
25.0000 mg | ORAL_TABLET | Freq: Four times a day (QID) | ORAL | Status: DC | PRN
  Administered 2014-03-01 – 2014-03-06 (×9): 25 mg via ORAL
  Filled 2014-03-01: qty 20
  Filled 2014-03-01 (×10): qty 1

## 2014-03-01 MED ORDER — CLONIDINE HCL 0.1 MG PO TABS: 0.1000 mg | ORAL_TABLET | Freq: Every day | ORAL | Status: DC

## 2014-03-01 MED ORDER — METHOCARBAMOL 500 MG PO TABS: 500.0000 mg | ORAL_TABLET | Freq: Three times a day (TID) | ORAL | Status: DC | PRN

## 2014-03-01 MED ORDER — ONDANSETRON 4 MG PO TBDP: 4.0000 mg | ORAL_TABLET | Freq: Four times a day (QID) | ORAL | Status: DC | PRN

## 2014-03-01 MED ORDER — LORAZEPAM 1 MG PO TABS: 1.0000 mg | ORAL_TABLET | Freq: Every day | ORAL | Status: DC

## 2014-03-01 MED ORDER — ACETAMINOPHEN 325 MG PO TABS
650.0000 mg | ORAL_TABLET | Freq: Four times a day (QID) | ORAL | Status: DC | PRN
  Administered 2014-03-03 – 2014-03-07 (×4): 650 mg via ORAL
  Filled 2014-03-01 (×6): qty 2

## 2014-03-01 MED ORDER — CLONIDINE HCL 0.1 MG PO TABS
0.1000 mg | ORAL_TABLET | Freq: Four times a day (QID) | ORAL | Status: DC
  Administered 2014-03-02 (×2): 0.1 mg via ORAL
  Filled 2014-03-01 (×11): qty 1

## 2014-03-01 MED ORDER — HYDROXYZINE HCL 25 MG PO TABS
25.0000 mg | ORAL_TABLET | Freq: Four times a day (QID) | ORAL | Status: DC | PRN
  Administered 2014-03-02: 25 mg via ORAL

## 2014-03-01 MED ORDER — OLANZAPINE 5 MG PO TABS: 15.0000 mg | ORAL_TABLET | Freq: Every day | ORAL | Status: DC

## 2014-03-01 MED ORDER — VITAMIN B-1 100 MG PO TABS
100.0000 mg | ORAL_TABLET | Freq: Every day | ORAL | Status: DC
  Administered 2014-03-01: 100 mg via ORAL
  Filled 2014-03-01: qty 1

## 2014-03-01 MED ORDER — THIAMINE HCL 100 MG/ML IJ SOLN: 100.0000 mg | Freq: Once | INTRAMUSCULAR | Status: DC

## 2014-03-01 MED ORDER — LORAZEPAM 1 MG PO TABS
0.0000 mg | ORAL_TABLET | Freq: Four times a day (QID) | ORAL | Status: DC
  Administered 2014-03-01: 2 mg via ORAL
  Filled 2014-03-01: qty 2

## 2014-03-01 MED ORDER — ONDANSETRON 4 MG PO TBDP
4.0000 mg | ORAL_TABLET | Freq: Four times a day (QID) | ORAL | Status: DC | PRN
  Administered 2014-03-01: 4 mg via ORAL
  Filled 2014-03-01: qty 1

## 2014-03-01 MED ORDER — LOPERAMIDE HCL 2 MG PO CAPS
2.0000 mg | ORAL_CAPSULE | ORAL | Status: DC | PRN
  Administered 2014-03-01 – 2014-03-02 (×2): 2 mg via ORAL
  Filled 2014-03-01 (×3): qty 1

## 2014-03-01 MED ORDER — ACETAMINOPHEN 325 MG PO TABS: 650.0000 mg | ORAL_TABLET | ORAL | Status: DC | PRN

## 2014-03-01 MED ORDER — LORAZEPAM 1 MG PO TABS: 1.0000 mg | ORAL_TABLET | Freq: Two times a day (BID) | ORAL | Status: DC

## 2014-03-01 MED ORDER — NAPROXEN 500 MG PO TABS: 500.0000 mg | ORAL_TABLET | Freq: Two times a day (BID) | ORAL | Status: DC | PRN

## 2014-03-01 MED ORDER — LOPERAMIDE HCL 2 MG PO CAPS: 2.0000 mg | ORAL_CAPSULE | ORAL | Status: DC | PRN

## 2014-03-01 MED ORDER — DICYCLOMINE HCL 20 MG PO TABS: 20.0000 mg | ORAL_TABLET | Freq: Four times a day (QID) | ORAL | Status: DC | PRN

## 2014-03-01 MED ORDER — HYDROXYZINE HCL 25 MG PO TABS: 25.0000 mg | ORAL_TABLET | Freq: Four times a day (QID) | ORAL | Status: DC | PRN

## 2014-03-01 MED ORDER — LORAZEPAM 1 MG PO TABS
1.0000 mg | ORAL_TABLET | Freq: Four times a day (QID) | ORAL | Status: DC | PRN
  Administered 2014-03-01 – 2014-03-02 (×2): 1 mg via ORAL
  Filled 2014-03-01 (×2): qty 1

## 2014-03-01 MED ORDER — LORAZEPAM 1 MG PO TABS: 0.0000 mg | ORAL_TABLET | Freq: Two times a day (BID) | ORAL | Status: DC

## 2014-03-01 MED ORDER — LORAZEPAM 1 MG PO TABS: 1.0000 mg | ORAL_TABLET | Freq: Three times a day (TID) | ORAL | Status: DC

## 2014-03-01 MED ORDER — MAGNESIUM HYDROXIDE 400 MG/5ML PO SUSP: 30.0000 mL | Freq: Every day | ORAL | Status: DC | PRN

## 2014-03-01 MED ORDER — METHOCARBAMOL 500 MG PO TABS
500.0000 mg | ORAL_TABLET | Freq: Three times a day (TID) | ORAL | Status: AC | PRN
  Administered 2014-03-01 – 2014-03-06 (×9): 500 mg via ORAL
  Filled 2014-03-01 (×10): qty 1

## 2014-03-01 MED ORDER — CLONIDINE HCL 0.1 MG PO TABS
0.1000 mg | ORAL_TABLET | Freq: Four times a day (QID) | ORAL | Status: DC
  Administered 2014-03-01: 0.1 mg via ORAL
  Filled 2014-03-01: qty 1

## 2014-03-01 MED ORDER — OLANZAPINE 7.5 MG PO TABS
15.0000 mg | ORAL_TABLET | Freq: Every day | ORAL | Status: DC
  Administered 2014-03-01 – 2014-03-06 (×6): 15 mg via ORAL
  Filled 2014-03-01: qty 2
  Filled 2014-03-01: qty 6
  Filled 2014-03-01 (×6): qty 2
  Filled 2014-03-01: qty 6

## 2014-03-01 MED ORDER — NICOTINE 21 MG/24HR TD PT24
MEDICATED_PATCH | TRANSDERMAL | Status: AC
  Administered 2014-03-01: 21 mg
  Filled 2014-03-01: qty 1

## 2014-03-01 MED ORDER — ZOLPIDEM TARTRATE 5 MG PO TABS: 5.0000 mg | ORAL_TABLET | Freq: Every evening | ORAL | Status: DC | PRN

## 2014-03-01 MED ORDER — THIAMINE HCL 100 MG/ML IJ SOLN: 100.0000 mg | Freq: Every day | INTRAMUSCULAR | Status: DC
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
## 2014-03-01 NOTE — ED Notes (Signed)
Bed: [facility]D Expected date:  Expected time:  Means of arrival:  Comments: 

## 2014-03-01 NOTE — Tx Team (Addendum)
Initial Interdisciplinary Treatment Plan   PATIENT STRESSORS: Loss of daughter to DSS Medication change or noncompliance Substance abuse Traumatic event   PATIENT STRENGTHS: Ability for insight Average or above average intelligence Capable of independent living Communication skills Motivation for treatment/growth Supportive family/friends   PROBLEM LIST: Problem List/Patient Goals Date to be addressed Date deferred Reason deferred Estimated date of resolution  Substance Abuse 03/01/14   At D/C  Methadone taper-withdrawal 03/01/14   At D/C  Depression 03/01/14   At D/C  Medication Management 03/01/14   At D/C  Suicidal ideation-safety 03/01/14   At D/C  "detox" 03-06-14                        DISCHARGE CRITERIA:  Ability to meet basic life and health needs Improved stabilization in mood, thinking, and/or behavior Motivation to continue treatment in a less acute level of care Need for constant or close observation no longer present Verbal commitment to aftercare and medication compliance Withdrawal symptoms are absent or subacute and managed without 24-hour nursing intervention  PRELIMINARY DISCHARGE PLAN: Attend PHP/IOP Attend 12-step recovery group Outpatient therapy Return to previous living arrangement  PATIENT/FAMIILY INVOLVEMENT: This treatment plan has been presented to and reviewed with the patient, Ranelle Oystermanda D Harton.  The patient and family have been given the opportunity to ask questions and make suggestions.  Celene KrasRobinson, Cindia Hustead G 03/01/2014, 8:07 PM

## 2014-03-01 NOTE — Progress Notes (Signed)
Pt alert, oriented and cooperative. Affect/ mood sad and depressed. Concerned about being without methadone. Discharged to adult unit. VSS. Belongings reviewed. Emotional support and encouragement given.

## 2014-03-01 NOTE — Progress Notes (Signed)
[facility] INPATIENT:  Family/Significant Other Suicide Prevention Education  Suicide Prevention Education:  Patient Refusal for Family/Significant Other Suicide Prevention Education: The patient [name] has refused to provide written consent for family/significant other to be provided Family/Significant Other Suicide Prevention Education during admission and/or prior to discharge.  Physician notified.  [name], Burk Hoctor J 03/01/2014, 4:33 PM

## 2014-03-01 NOTE — ED Notes (Signed)
Bed: [facility] Expected date:  Expected time:  Means of arrival:  Comments: 

## 2014-03-01 NOTE — Progress Notes (Signed)
Patient ID: [name], female   DOB: 12/19/1985, 28 y.o.   MRN: [id number] Patient has been crying constantly since admission to obs.  States she can't take Tylenol for pain due to Hep C.  Also states she can't go through withdrawal from her Methadone which she has taken for 10 years. Fearful she will have a seizure if she has to withdraw from both Xanax and Methadone. Placed call to Dr Jama Flavorsobos and to Renata Capriceonrad, NP. Awaiting call back

## 2014-03-01 NOTE — Progress Notes (Signed)
Patient ID: [name], female   DOB: 12/09/1985, 28 y.o.   MRN: [id number] Patient admitted to observation unit. Patient reports a increase in crying spells, not sleeping, and/or eating for 2-3 days. She reports passive suicidal thoughts (no plan/intent). Patient with history of 3 or more suicide attempts (overdoses, cutting wrist, and trying to jump out of car). Triggers include loss of daughter to DSS.  Patient tearful on admission to unit.  States she is off meds depressed and feeling hopeless.but denies SI, HI, and AVH. Talked to boyfriend on phone and became upset. Patient instructed to end conversation due to increased crying and anxiety. Offered  Nourishment. Checks q 15 minutes continued.

## 2014-03-01 NOTE — ED Notes (Signed)
Patient says the medicine that was given to her has helped her calm down at this time. I spoke with the patient about ways to help keep her mind occupied when they start feeling anxious. Patient is calm at this time

## 2014-03-01 NOTE — Plan of Care (Signed)
[facility] Observation Crisis Plan  Reason for Crisis Plan:  Crisis Stabilization and Substance Abuse   Plan of Care:  Referral for Inpatient Hospitalization and Referral for Substance Abuse  Family Support:      Current Living Environment:  Living Arrangements: Alone  Insurance:   Hospital Account    Name Acct ID Class Status Primary Coverage   [name], Eshani Gould [id number] BEHAVIORAL HEALTH OBSERVATION Open [employer] [place] [employer]        Guarantor Account (for Hospital Account [id number])    Name Relation to Pt Service Area Active? Acct Type   [name], Anju Gould Self [facility] Yes Behavioral Health   Address Phone       [address] [place], [place] [address] [phone])          Coverage Information (for Hospital Account [id number])    F/O Payor/Plan Precert #   [employer]    Subscriber Subscriber #   [name], [name] [id number]   Address Phone   [address] [place], [place] [address]       Legal Guardian:     Primary Care Provider:  No primary care provider on file.  Current Outpatient Providers:  unknown  Psychiatrist:     Counselor/Therapist:     Compliant with Medications:  No  Additional Information:   [name], Philemon Riedesel J 12/23/20154:31 PM

## 2014-03-01 NOTE — ED Notes (Signed)
Attempted using telepsych machine-will not work at this time. Social work at bedside speaking with patient.

## 2014-03-01 NOTE — ED Provider Notes (Signed)
CSN: [id number]     Arrival date & time 03/01/14  1027 History  This chart was scribed for non-physician practitioner, Eben Burowourtney A Forcucci, PA-C, working with Purvis SheffieldForrest Harrison, MD by Charline BillsEssence Howell, ED Scribe. This patient was seen in room [facility]TR3/[facility]3 and the patient's care was started at 12:11 PM.   Chief Complaint  Patient presents with  . Depression  . off meds    The history is provided by the patient. No language interpreter was used.   HPI Comments: [name] is a 28 y.o. female who presents to the Emergency Department complaining of increased depression over the past few days. Pt states that she has been coming off of Benzos and has been without Zyprexa for 5 days due to finances. She also reports that she lost her daughter a few months ago. Pt reports associated HA, bilateral leg pain, restless legs, auditory hallucinations, difficulty eating and sleeping. She states "I feel hopeless and I know I need help because I can't do this on my own". She denies HI and SI.  Past Medical History  Diagnosis Date  . Drug abuse   . GERD (gastroesophageal reflux disease)   . Depression   . Arthritis   . Seizures   . Chronic pain   . Methadone maintenance therapy patient   . Anxiety    Past Surgical History  Procedure Laterality Date  . Appendectomy    . Cesarean section    . Mandible fracture surgery     Family History  Problem Relation Age of Onset  . Mental illness Mother   . Mental illness Brother   . Diabetes Maternal Grandmother   . Mental illness Maternal Grandmother   . Hypertension Maternal Grandfather   . Diabetes Paternal Grandmother   . Heart disease Paternal Grandmother    History  Substance Use Topics  . Smoking status: Current Some Day Smoker -- 1.00 packs/day for 5 years    Last Attempt to Quit: 01/13/2011  . Smokeless tobacco: Never Used  . Alcohol Use: Yes   OB History    Gravida Para Term Preterm AB TAB SAB Ectopic Multiple Living   3 2   1 1           Review of Systems  Musculoskeletal: Positive for myalgias.  Neurological: Positive for headaches.  Psychiatric/Behavioral: Positive for hallucinations, sleep disturbance and dysphoric mood. Negative for suicidal ideas.  All other systems reviewed and are negative.  Allergies  Narcan and Suboxone  Home Medications   Prior to Admission medications   Medication Sig Start Date End Date Taking? Authorizing Provider  clonazePAM (KLONOPIN) 1 MG tablet Take 1 tablet (1 mg total) by mouth 2 (two) times daily. Patient taking differently: Take 1 mg by mouth 3 (three) times daily.  08/29/13  Yes Rodolph Bonganiel Thompson V, MD  gabapentin (NEURONTIN) 600 MG tablet Take 600 mg by mouth 4 (four) times daily.   Yes Historical Provider, MD  meclizine (ANTIVERT) 25 MG tablet Take 25 mg by mouth 3 (three) times daily as needed for dizziness or nausea.   Yes Historical Provider, MD  methadone (DOLOPHINE) 10 MG/ML solution Take 65 mg by mouth daily.    Yes Historical Provider, MD  OLANZapine (ZYPREXA) 15 MG tablet Take 15 mg by mouth at bedtime.   Yes Historical Provider, MD  busPIRone (BUSPAR) 10 MG tablet Take 2 tablets (20 mg total) by mouth 3 (three) times daily. Patient not taking: Reported on 03/01/2014 08/29/13   Rodolph Bonganiel Thompson V, MD  DULoxetine (CYMBALTA) 30 MG capsule Take 1 capsule (30 mg total) by mouth daily. Patient not taking: Reported on 03/01/2014 08/29/13   Rodolph Bonganiel Thompson V, MD  gabapentin (NEURONTIN) 800 MG tablet Take 1 tablet (800 mg total) by mouth 4 (four) times daily. Patient not taking: Reported on 03/01/2014 08/29/13   Rodolph Bonganiel Thompson V, MD  nicotine (NICODERM CQ - DOSED IN MG/24 HR) 7 mg/24hr patch Place 1 patch (7 mg total) onto the skin daily. Patient not taking: Reported on 03/01/2014 08/29/13   Rodolph Bonganiel Thompson V, MD  polyethylene glycol [facility](MIRALAX / Ethelene HalGLYCOLAX) packet Take 17 g by mouth 2 (two) times daily. Hold if you develop diarrhea Patient not taking: Reported on 03/01/2014 08/29/13    Rodolph Bonganiel Thompson V, MD  senna-docusate (SENOKOT-S) 8.6-50 MG per tablet Take 1 tablet by mouth at bedtime. Hold if you develop diarrhea Patient not taking: Reported on 03/01/2014 08/29/13   Rodolph Bonganiel Thompson V, MD   Triage Vitals: BP 132/88 mmHg  Pulse 111  Temp(Src) 98.5 F (36.9 C) (Oral)  Resp 24  SpO2 95%  LMP 02/13/2014 (Exact Date) Physical Exam  Constitutional: She is oriented to person, place, and time. She appears well-developed and well-nourished. No distress.  HENT:  Head: Normocephalic and atraumatic.  Eyes: Conjunctivae and EOM are normal.  Neck: Neck supple. No tracheal deviation present.  Cardiovascular: Normal rate.   Pulmonary/Chest: Effort normal. No respiratory distress.  Musculoskeletal: Normal range of motion.  Neurological: She is alert and oriented to person, place, and time.  Skin: Skin is warm and dry.  Psychiatric: Her speech is normal. Thought content is not paranoid and not delusional. She exhibits a depressed mood. She expresses no homicidal and no suicidal ideation. She expresses no suicidal plans and no homicidal plans.  Patient crying uncontrollably in the room.  Nursing note and vitals reviewed.  ED Course  Procedures (including critical care time) DIAGNOSTIC STUDIES: Oxygen Saturation is 95% on RA, adequate by my interpretation.    COORDINATION OF CARE: 12:16 PM-Discussed treatment plan which includes diagnostic lab work with pt at bedside and pt agreed to plan.   Labs Review Labs Reviewed  CBC WITH DIFFERENTIAL - Abnormal; Notable for the following:    HCT 34.7 (*)    All other components within normal limits  COMPREHENSIVE METABOLIC PANEL - Abnormal; Notable for the following:    Glucose, Bld 102 (*)    Total Protein 8.8 (*)    All other components within normal limits  URINE RAPID DRUG SCREEN (HOSP PERFORMED) - Abnormal; Notable for the following:    Benzodiazepines POSITIVE (*)    Tetrahydrocannabinol POSITIVE (*)    All other components  within normal limits  ETHANOL   Imaging Review No results found.   EKG Interpretation None      MDM   Final diagnoses:  Bipolar 1 disorder  Opioid dependence with withdrawal  Benzodiazepine withdrawal, uncomplicated   Patient is a 28 year old female who presents emergency room for evaluation of depression, withdrawal from opiates, and withdrawal from the benzodiazepines. Physical exam reveals patient crying uncontrollably in the room. Labs are unremarkable. Patient medically cleared at this time. Home meds review ordered. Patient to be moved to the psychiatric daily and to be evaluated by TTS.  I personally performed the services described in this documentation, which was scribed in my presence. The recorded information has been reviewed and is accurate.    Eben Burowourtney A Forcucci, PA-C 03/01/14 1221  Purvis SheffieldForrest Harrison, MD 03/01/14 [phone]

## 2014-03-01 NOTE — ED Notes (Signed)
Pt states she has been off her bipolar meds for about 5 days and is also coming off benzos 1.5 days ago. Pt is tearful in triage but denies SI/HI. Pt states " I want some one to help me not feel like this during Christmas" Pt states he feels hopeless.

## 2014-03-01 NOTE — [facility] Counselor (Signed)
Attempted to do TA at 1232 but the pt is in the hall so ED will need to find an available room. RN to call TTS back.  Kateri PlummerKristin Jackie Littlejohn, M.S., LPCA, [facility] Licensed Professional Counselor Associate  Triage Specialist  [facility]  Therapeutic Triage Services Phone: [phone] Fax: [phone]

## 2014-03-01 NOTE — BH Assessment (Signed)
Writer informed TTS [place] [facility] [employer] [facility] of the consult

## 2014-03-01 NOTE — Progress Notes (Signed)
Patient ID: [name], female   DOB: 09/27/1985, 28 y.o.   MRN: [id number] Received call back from [place], NP. Patient to be transferred to adult unit when bed is available. Patient informed.

## 2014-03-01 NOTE — BH Assessment (Signed)
Writer infomred [facility]ite Aid[facility]ristin Chesire of the consult.

## 2014-03-02 DIAGNOSIS — F332 Major depressive disorder, recurrent severe without psychotic features: Secondary | ICD-10-CM

## 2014-03-02 DIAGNOSIS — F1994 Other psychoactive substance use, unspecified with psychoactive substance-induced mood disorder: Secondary | ICD-10-CM

## 2014-03-02 MED ORDER — LORAZEPAM 1 MG PO TABS
1.0000 mg | ORAL_TABLET | Freq: Every day | ORAL | Status: AC
  Administered 2014-03-02: 1 mg via ORAL
  Filled 2014-03-02: qty 1

## 2014-03-02 MED ORDER — LORAZEPAM 1 MG PO TABS: 1.0000 mg | ORAL_TABLET | Freq: Two times a day (BID) | ORAL | Status: DC

## 2014-03-02 MED ORDER — LORAZEPAM 1 MG PO TABS: 1.0000 mg | ORAL_TABLET | Freq: Every day | ORAL | Status: DC

## 2014-03-02 MED ORDER — METHADONE HCL 5 MG PO TABS
65.0000 mg | ORAL_TABLET | Freq: Every day | ORAL | Status: DC
  Administered 2014-03-02 – 2014-03-07 (×6): 65 mg via ORAL
  Filled 2014-03-02 (×6): qty 13

## 2014-03-02 MED ORDER — CELECOXIB 100 MG PO CAPS
200.0000 mg | ORAL_CAPSULE | Freq: Two times a day (BID) | ORAL | Status: DC
  Administered 2014-03-02 – 2014-03-07 (×10): 200 mg via ORAL
  Filled 2014-03-02 (×2): qty 1
  Filled 2014-03-02: qty 2
  Filled 2014-03-02 (×4): qty 1
  Filled 2014-03-02: qty 2
  Filled 2014-03-02 (×4): qty 1
  Filled 2014-03-02: qty 2
  Filled 2014-03-02 (×2): qty 1

## 2014-03-02 NOTE — Progress Notes (Signed)
Pt attended karaoke group this evening.  

## 2014-03-02 NOTE — Progress Notes (Addendum)
D:  Patient denied SI and HI, contracts for safety.  Denied A/V hallucinations.  Patient continually asked about methadone.  Methadone given to patient after lunch. A:  Medications administered per MD orders.  Emotional support and encouragement given patient. R:  Safety maintained with 15 minute checks.  1850  Patient requested vistaril for anxiety which was given to patient.  Then patient asked for ativan as she was leaving med window.  Withdrawals not greater than 10 per MD order.  Explained to patient that ativan 1 mg scheduled at 10:00 p.m.  Robaxin prn for muscle spasms given to patient.  Will continue to evaluate patient's status.  Respirations even and unlabored.  No signs/symptoms of pain/distress noted on patient's face or body movements.
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
## 2014-03-02 NOTE — Progress Notes (Signed)
Problem: Ineffective individual coping Goal: STG: Patient will remain free from self harm and report any suicidal thoughts to staff. Outcome: Progressing Pt denies SI, verbally contracts for safety. Pt remains on Q 15 minutes checks. Pt remained free from self harm.

## 2014-03-02 NOTE — [facility] Group Notes (Signed)
[facility] LCSW Aftercare Discharge Planning Group Note   03/02/2014 11:43 AM  Participation Quality:  Appropriate   Mood/Affect:  Irritable and Tearful  Depression Rating:  10  Anxiety Rating:  10  Withdrawal Sx: SEVERE 10   Thoughts of Suicide:  No Will you contract for safety?   NA  Current AVH:  No  Plan for Discharge/Comments:  Pt reports that she has been triturating down on methadone for past two years and is currently at 65 mg per day. CSW verified this with [facility] this morning and relayed info to MD/NP. Pt reports that she has been abusing marijuana and Valium for past 10 years and is ready to detox. Pt reports severe withdrawals and anxiety this morning. Pt plans to live with her uncle in [place] at d/c and [facility]up [facility]. Pt requesting ACT services but reports that Medicaid has lapsed. CSW assessing.   Transportation Means: my boyfriend will take me to my uncle's house at d/c.   Supports: boyfriend and uncle. Pt reports strong network of friends in the community.   Smart, American FinancialHeather LCSWA

## 2014-03-02 NOTE — BHH Group Notes (Signed)
The focus of this group is to educate the patient on the purpose and policies of crisis stabilization and provide a format to answer questions about their admission.  The group details unit policies and expectations of patients while admitted.  Patient did not attend 0900 nurse education orientation group this morning.  Patient stayed in bed.   

## 2014-03-02 NOTE — Progress Notes (Signed)
Pt admitted to Adult Unit for depression, anxiety and substance abuse. Pt alert, oriented and cooperative. Affect/ mood sad and depressed. Reports crying spells, worrying, insomnia and increased depression r/t multiple stressors to include loss of daughter to DSS custody .Passive SI, verbally contacts for safety, -HI, - A/V hall. C/o generalized pain, see MAR. Pt [facility] years and is concerned about withdrawal symptoms..  Emotional support and encouragement given.  Will monitor closely and evaluate for stabilization.

## 2014-03-02 NOTE — Plan of Care (Signed)
Problem: Ineffective individual coping Goal: STG: Patient will remain free from self harm and report any suicidal thoughts to staff. Outcome: Progressing Pt reports passive SI, verbally contracts for safety. Pt remains on Q 15 minutes checks.  Pt remained free from self harm.
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
## 2014-03-02 NOTE — [facility] Suicide Risk Assessment (Signed)
   Nursing information obtained from:   pt Demographic factors:   female Current Mental Status:   depressed with SI Loss Factors:   loss of son, husband in prison Historical Factors:   hx of previous suicide attempts Risk Reduction Factors:   sin to commit suicide, living with family Total Time spent with patient: 45 minutes  CLINICAL FACTORS:   Bipolar Disorder:   Depressive phase Depression:   Anhedonia Comorbid alcohol abuse/dependence Hopelessness Severe Alcohol/Substance Abuse/Dependencies More than one psychiatric diagnosis Medical Diagnoses and Treatments/Surgeries  Psychiatric Specialty Exam: Physical Exam  ROS  Blood pressure 104/56, pulse 85, temperature 98.1 F (36.7 C), temperature source Oral, resp. rate 20, height 5\' 2"  (1.575 m), weight 76.204 kg (168 lb), last menstrual period 02/13/2014, SpO2 98 %.Body mass index is 30.72 kg/(m^2).  General Appearance: Casual  Eye Contact::  Good  Speech:  Clear and Coherent and Normal Rate  Volume:  Normal  Mood:  Depressed  Affect:  Congruent  Thought Process:  Intact, Linear and Logical  Orientation:  Full (Time, Place, and Person)  Thought Content:  Hallucinations: Auditory  Suicidal Thoughts:  Yes.  without intent/plan  Homicidal Thoughts:  No  Memory:  Immediate;   Good Recent;   Good Remote;   Good  Judgement:  Good  Insight:  Good  Psychomotor Activity:  Normal  Concentration:  Good  Recall:  Good  Fund of Knowledge:Good  Language: Good  Akathisia:  No  Handed:  Right  AIMS (if indicated):     Assets:  Communication Skills Desire for Improvement Housing  Sleep:  Number of Hours: 6.75   Musculoskeletal: Strength & Muscle Tone: within normal limits Gait & Station: normal Patient leans: N/A  COGNITIVE FEATURES THAT CONTRIBUTE TO RISK:  Closed-mindedness Loss of executive function Polarized thinking Thought constriction (tunnel vision)    SUICIDE RISK:   Moderate:  Frequent suicidal ideation with  limited intensity, and duration, some specificity in terms of plans, no associated intent, good self-control, limited dysphoria/symptomatology, some risk factors present, and identifiable protective factors, including available and accessible social support.  PLAN OF CARE:  I certify that inpatient services furnished can reasonably be expected to improve the patient's condition.  [name] 03/02/2014, 10:20 AM

## 2014-03-02 NOTE — Progress Notes (Signed)
Pt alert and cooperative. Affect/ mood anxious and depressed. - SI/HI, verbally contacts for safety, - A/V hall. Medication and attention seeking. C/o generalized pain and withdrawal symptoms, see MAR. Visible in milieu, interactive with peers. Attended recreational group. Emotional support and encouragement given. Will monitor closely and evaluate for stabilization.

## 2014-03-02 NOTE — Plan of Care (Signed)
Problem: Consults Goal: [facility] General Treatment Patient Education Outcome: Completed/Met Date Met:  03/02/14 Nurse discussed treatment plan with patient.        

## 2014-03-03 MED ORDER — ONDANSETRON 4 MG PO TBDP: 4.0000 mg | ORAL_TABLET | Freq: Four times a day (QID) | ORAL | Status: AC | PRN

## 2014-03-03 MED ORDER — CHLORDIAZEPOXIDE HCL 25 MG PO CAPS
25.0000 mg | ORAL_CAPSULE | Freq: Three times a day (TID) | ORAL | Status: AC
  Administered 2014-03-04 – 2014-03-05 (×3): 25 mg via ORAL
  Filled 2014-03-03 (×3): qty 1

## 2014-03-03 MED ORDER — LOPERAMIDE HCL 2 MG PO CAPS
2.0000 mg | ORAL_CAPSULE | ORAL | Status: AC | PRN
  Administered 2014-03-05: 2 mg via ORAL
  Filled 2014-03-03: qty 1

## 2014-03-03 MED ORDER — CHLORDIAZEPOXIDE HCL 25 MG PO CAPS
50.0000 mg | ORAL_CAPSULE | Freq: Once | ORAL | Status: AC
  Administered 2014-03-03: 50 mg via ORAL
  Filled 2014-03-03: qty 2

## 2014-03-03 MED ORDER — CHLORDIAZEPOXIDE HCL 25 MG PO CAPS
25.0000 mg | ORAL_CAPSULE | Freq: Every day | ORAL | Status: AC
  Administered 2014-03-07: 25 mg via ORAL
  Filled 2014-03-03: qty 1

## 2014-03-03 MED ORDER — CHLORDIAZEPOXIDE HCL 25 MG PO CAPS
25.0000 mg | ORAL_CAPSULE | ORAL | Status: AC
  Administered 2014-03-05 – 2014-03-06 (×2): 25 mg via ORAL
  Filled 2014-03-03 (×2): qty 1

## 2014-03-03 MED ORDER — MIRTAZAPINE 7.5 MG PO TABS
7.5000 mg | ORAL_TABLET | Freq: Every day | ORAL | Status: DC
  Administered 2014-03-03 – 2014-03-06 (×4): 7.5 mg via ORAL
  Filled 2014-03-03 (×7): qty 1

## 2014-03-03 MED ORDER — CHLORDIAZEPOXIDE HCL 25 MG PO CAPS
25.0000 mg | ORAL_CAPSULE | Freq: Four times a day (QID) | ORAL | Status: AC
  Administered 2014-03-03 – 2014-03-04 (×4): 25 mg via ORAL
  Filled 2014-03-03 (×4): qty 1

## 2014-03-03 NOTE — Plan of Care (Signed)
Problem: Alteration in mood & ability to function due to withdrawal symptoms Goal: STG: Patient verbalizes decreases in signs of withdrawal Outcome: Progressing Reports decreased withdrawal symptoms

## 2014-03-03 NOTE — Plan of Care (Signed)
Problem: Consults Goal: Depression Patient Education See Patient Education Module for education specifics.  Outcome: Completed/Met Date Met:  03/03/14 Nurse discussed with pt.     

## 2014-03-03 NOTE — Progress Notes (Signed)
Pt alert and cooperative. Affect/ mood congruent, anxious and depressed. - SI/HI, verbally contacts for safety, - A/V hall.  Pt continues to be medication seeking. C/o muscle spasms and withdrawal symptoms, see MAR. Visible in milieu, interactive with peers. Emotional support and encouragement given. Will monitor closely and evaluate for stabilization

## 2014-03-03 NOTE — Progress Notes (Signed)
[facility] MD Progress Note  03/03/2014 10:04 AM [name]  MRN:  [id number] Subjective:  Met with patient who was distraught over her withdrawal symptoms. She only received 1 Ativan per her report and the withdrawal symptoms are worsening. She states she is nauseated,  Having chills, sweats, tremors, muscle cramps.  Objective: Cows scores are significant at 8, the patient's vital signs are tachy at 99, she is diaphoretic, tremulous, tearful and in obvious discomfort. She is requesting to be placed on the Librium protocol, and this is honored.  The ativan protocol is discontinued as her Liver Enzymes are normal. Diagnosis:  Benzodiazapine withdrawal, Opiate dependence. DSM5: Schizophrenia Disorders:   Obsessive-Compulsive Disorders:   Trauma-Stressor Disorders:   Substance/Addictive Disorders:   Depressive Disorders:   Total Time spent with patient: 20 minutes    ADL's:  Impaired  Sleep: Poor  Appetite:  Poor  Suicidal Ideation:  denies Homicidal Ideation:  denies AEB (as evidenced by):  Psychiatric Specialty Exam: Physical Exam  ROS  Blood pressure 110/62, pulse 99, temperature 98.3 F (36.8 C), temperature source Oral, resp. rate 18, height _0  (1.575 m), weight 76.204 kg (168 lb), last menstrual period 02/13/2014, SpO2 100 %.Body mass index is 30.72 kg/(m^2).  General Appearance: Disheveled  Eye Sport and exercise psychologist::  Fair  Speech:  Clear and Coherent  Volume:  Decreased  Mood:  Anxious, Hopeless and Irritable  Affect:  Congruent  Thought Process:  Goal Directed  Orientation:  Full (Time, Place, and Person)  Thought Content:  WDL  Suicidal Thoughts:  No  Homicidal Thoughts:  No  Memory:  NA  Judgement:  Fair  Insight:  Present  Psychomotor Activity:  Restlessness and Tremor  Concentration:  Poor  Recall:  NA  Fund of Knowledge:NA  Language: Good  Akathisia:  No  Handed:  Right  AIMS (if indicated):     Assets:  Communication Skills Physical Health  Sleep:  Number of  Hours: 6.75   Musculoskeletal: Strength & Muscle Tone: within normal limits Gait & Station: normal Patient leans: N/A  Current Medications: Current Facility-Administered Medications  Medication Dose Route Frequency Provider Last Rate Last Dose  . acetaminophen (TYLENOL) tablet 650 mg  650 mg Oral Q6H PRN Benjamine Mola, FNP   650 mg at 03/01/14 1659  . alum & mag hydroxide-simeth (MAALOX/MYLANTA) 200-200-20 MG/5ML suspension 30 mL  30 mL Oral Q4H PRN Benjamine Mola, FNP      . celecoxib (CELEBREX) capsule 200 mg  200 mg Oral BID Nena Polio, PA-C   200 mg at 03/03/14 0756  . chlordiazePOXIDE (LIBRIUM) capsule 25 mg  25 mg Oral QID Nena Polio, PA-C       Followed by  . [START ON 03/04/2014] chlordiazePOXIDE (LIBRIUM) capsule 25 mg  25 mg Oral TID Nena Polio, PA-C       Followed by  . [START ON 03/05/2014] chlordiazePOXIDE (LIBRIUM) capsule 25 mg  25 mg Oral BH-qamhs Nena Polio, PA-C       Followed by  . [START ON 03/07/2014] chlordiazePOXIDE (LIBRIUM) capsule 25 mg  25 mg Oral Daily Nena Polio, PA-C      . chlordiazePOXIDE (LIBRIUM) capsule 50 mg  50 mg Oral Once [employer], PA-C      . gabapentin (NEURONTIN) capsule 600 mg  600 mg Oral QID Benjamine Mola, FNP   600 mg at 03/03/14 0756  . hydrOXYzine (ATARAX/VISTARIL) tablet 25 mg  25 mg Oral Q6H PRN Benjamine Mola, FNP   25 mg at  03/03/14 0804  . loperamide (IMODIUM) capsule 2-4 mg  2-4 mg Oral PRN Nena Polio, PA-C      . LORazepam (ATIVAN) tablet 1 mg  1 mg Oral Q6H PRN Jenne Campus, MD   1 mg at 03/02/14 5927  . magnesium hydroxide (MILK OF MAGNESIA) suspension 30 mL  30 mL Oral Daily PRN Benjamine Mola, FNP      . meclizine (ANTIVERT) tablet 25 mg  25 mg Oral TID PRN Benjamine Mola, FNP      . methadone (DOLOPHINE) tablet 65 mg  65 mg Oral Daily Charlcie Cradle, MD   65 mg at 03/03/14 0750  . methocarbamol (ROBAXIN) tablet 500 mg  500 mg Oral Q8H PRN Benjamine Mola, FNP   500 mg at 03/03/14 6394  . nicotine  (NICODERM CQ - dosed in mg/24 hours) patch 21 mg  21 mg Transdermal Daily Benjamine Mola, FNP   21 mg at 03/02/14 1728  . OLANZapine (ZYPREXA) tablet 15 mg  15 mg Oral QHS Benjamine Mola, FNP   15 mg at 03/02/14 2124  . ondansetron (ZOFRAN-ODT) disintegrating tablet 4 mg  4 mg Oral Q6H PRN Nena Polio, PA-C      . traZODone (DESYREL) tablet 50 mg  50 mg Oral QHS PRN Benjamine Mola, FNP   50 mg at 03/02/14 2125    Lab Results:  Results for orders placed or performed during the hospital encounter of 03/01/14 (from the past 48 hour(s))  CBC WITH DIFFERENTIAL     Status: Abnormal   Collection Time: 03/01/14 10:57 AM  Result Value Ref Range   WBC 4.6 4.0 - 10.5 K/uL   RBC 4.02 3.87 - 5.11 MIL/uL   Hemoglobin 12.0 12.0 - 15.0 g/dL   HCT 34.7 (L) 36.0 - 46.0 %   MCV 86.3 78.0 - 100.0 fL   MCH 29.9 26.0 - 34.0 pg   MCHC 34.6 30.0 - 36.0 g/dL   RDW 12.1 11.5 - 15.5 %   Platelets 302 150 - 400 K/uL   Neutrophils Relative % 59 43 - 77 %   Neutro Abs 2.7 1.7 - 7.7 K/uL   Lymphocytes Relative 37 12 - 46 %   Lymphs Abs 1.7 0.7 - 4.0 K/uL   Monocytes Relative 4 3 - 12 %   Monocytes Absolute 0.2 0.1 - 1.0 K/uL   Eosinophils Relative 0 0 - 5 %   Eosinophils Absolute 0.0 0.0 - 0.7 K/uL   Basophils Relative 0 0 - 1 %   Basophils Absolute 0.0 0.0 - 0.1 K/uL  Comprehensive metabolic panel     Status: Abnormal   Collection Time: 03/01/14 10:57 AM  Result Value Ref Range   Sodium 136 135 - 145 mmol/L    Comment: Please note change in reference range.   Potassium 3.7 3.5 - 5.1 mmol/L    Comment: Please note change in reference range.   Chloride 102 96 - 112 mEq/L   CO2 26 19 - 32 mmol/L   Glucose, Bld 102 (H) 70 - 99 mg/dL   BUN 22 6 - 23 mg/dL   Creatinine, Ser 0.61 0.50 - 1.10 mg/dL   Calcium 9.6 8.4 - 10.5 mg/dL   Total Protein 8.8 (H) 6.0 - 8.3 g/dL   Albumin 5.1 3.5 - 5.2 g/dL   AST 23 0 - 37 U/L   ALT 21 0 - 35 U/L   Alkaline Phosphatase 56 39 - 117 U/L   Total Bilirubin 0.5  0.3 - 1.2  mg/dL   GFR calc non Af Amer >90 >90 mL/min   GFR calc Af Amer >90 >90 mL/min    Comment: (NOTE) The eGFR has been calculated using the CKD EPI equation. This calculation has not been validated in all clinical situations. eGFR's persistently <90 mL/min signify possible Chronic Kidney Disease.    Anion gap 8 5 - 15  Ethanol     Status: None   Collection Time: 03/01/14 10:57 AM  Result Value Ref Range   Alcohol, Ethyl (B) <5 0 - 9 mg/dL    Comment:        LOWEST DETECTABLE LIMIT FOR SERUM ALCOHOL IS 11 mg/dL FOR MEDICAL PURPOSES ONLY   Drug screen panel, emergency     Status: Abnormal   Collection Time: 03/01/14 11:02 AM  Result Value Ref Range   Opiates NONE DETECTED NONE DETECTED   Cocaine NONE DETECTED NONE DETECTED   Benzodiazepines POSITIVE (A) NONE DETECTED   Amphetamines NONE DETECTED NONE DETECTED   Tetrahydrocannabinol POSITIVE (A) NONE DETECTED   Barbiturates NONE DETECTED NONE DETECTED    Comment:        DRUG SCREEN FOR MEDICAL PURPOSES ONLY.  IF CONFIRMATION IS NEEDED FOR ANY PURPOSE, NOTIFY LAB WITHIN 5 DAYS.        LOWEST DETECTABLE LIMITS FOR URINE DRUG SCREEN Drug Class       Cutoff (ng/mL) Amphetamine      1000 Barbiturate      200 Benzodiazepine   333 Tricyclics       832 Opiates          300 Cocaine          300 THC              50     Physical Findings: AIMS: Facial and Oral Movements Muscles of Facial Expression: None, normal Lips and Perioral Area: None, normal Jaw: None, normal Tongue: None, normal,Extremity Movements Upper (arms, wrists, hands, fingers): None, normal Lower (legs, knees, ankles, toes): None, normal, Trunk Movements Neck, shoulders, hips: None, normal, Overall Severity Severity of abnormal movements (highest score from questions above): None, normal Incapacitation due to abnormal movements: None, normal Patient's awareness of abnormal movements (rate only patient's report): No Awareness, Dental Status Current problems  with teeth and/or dentures?: No Does patient usually wear dentures?: No  CIWA:  CIWA-Ar Total: 5 COWS:  COWS Total Score: 8  Treatment Plan Summary: Daily contact with patient to assess and evaluate symptoms and progress in treatment Medication management  Plan: NEW: Ativan d/c due to normal liver enzymes. Patient is started on Librium taper with a loading dose due to increased COWS scores and obvious distress. 1. Continue crisis management and stabilization. 2. Medication management to reduce current symptoms to base line and improve patient's overall level of functioning 3. Treat health problems as indicated. 4. Develop treatment plan to decrease risk of relapse upon discharge and the need for readmission. 5. Psycho-social education regarding relapse prevention and self care. 6. Health care follow up as needed for medical problems. 7. Continue home medications where appropriate.    Medical Decision Making Problem Points:  Established problem, worsening (2) Data Points:  Review of medication regiment & side effects (2) Review of new medications or change in dosage (2)  I certify that inpatient services furnished can reasonably be expected to improve the patient's condition.  Marlane Hatcher. Alonza Knisley RPAC 10:15 AM 03/03/2014

## 2014-03-03 NOTE — Progress Notes (Addendum)
D:  Patient's self inventory sheet, patient has poor sleep, sleep medication is not helpful.  Poor appetite, low energy level, poor concentration.  Rated depression and hopeless 8, anxiety 10.  Withdrawals of tremors, diarrhea, chilling, cravings, cramping, agitation, nausea, runny nose, irritability.  No sleep, leg cramps.  Denied SI.  Physical problems of headaches and pain.  All over pain #8.  Goal is to get back on benzo taper.  Discuss medications with MD.  "I have been into seizures here over cold [place] detox."  No discharge plan.  No problems anticipated after discharge. A:  Medications administered per MD orders.  Emotional support and encouragement given patient. R:  Denied SI and HI, contracts for safety.  Denied A/V hallucinations.  Safety maintained with 15 minute checks.  Patient was seen advising other patients about their medications this morning.  Patient was seen running down [address] hallway after stating her back was hurting.  Patient given tylenol prn for back pain before lunch.  Safety maintained with 15 minute checks.

## 2014-03-04 MED ORDER — SERTRALINE HCL 25 MG PO TABS
25.0000 mg | ORAL_TABLET | Freq: Every day | ORAL | Status: DC
  Administered 2014-03-04 – 2014-03-06 (×3): 25 mg via ORAL
  Filled 2014-03-04 (×6): qty 1

## 2014-03-04 MED ORDER — CLONIDINE HCL 0.1 MG/24HR TD PTWK
0.1000 mg | MEDICATED_PATCH | TRANSDERMAL | Status: DC
  Administered 2014-03-04: 0.1 mg via TRANSDERMAL
  Filled 2014-03-04 (×2): qty 1

## 2014-03-04 NOTE — Plan of Care (Signed)
Problem: Alteration in mood & ability to function due to Goal: STG-Patient will report withdrawal symptoms Outcome: Progressing Patient reports she has not had any withdrawal symptoms today. She reports that she feels the clonidine patch has helped with her withdrawals.

## 2014-03-04 NOTE — [facility] Group Notes (Signed)
[facility] Group Notes:  (Nursing/MHT/Case Management/Adjunct)  Date:  03/04/2014  Time:  0930  Type of Therapy:  Nurse Education  Participation Level:  Minimal  Participation Quality:  Appropriate  Affect:  Flat and Irritable  Cognitive:  Alert  Insight:  Limited  Engagement in Group:  Defensive  Modes of Intervention:  Clarification and Problem-solving  Summary of Progress/Problems:  [name], [name] 03/04/2014, 3:11 PM

## 2014-03-04 NOTE — Progress Notes (Signed)
Patient ID: Melissa Oystermanda D Barretta, female   DOB: 08/28/1985, 28 y.o.   MRN[id number] [facility]  03/04/2014 10:10 AM [name]  MRN:  [id number] Subjective: Patient states she is anxious and depressed and did not sleep well. Also states that her back hurts and she is asking about switching back to Ativan. Objective: [name] is med seeking today. Her COWS scores are coming down nicely and she is noted to have slept 6.75 hours. Will start Zoloft for her depression, and add a clonidine patch for her withdrawal as she did just recently lower her Methadone dose to 65mg . She is reminded that she can also use her coping skills to help her relax and she is also cautioned against providing other patients with medical advice.   Diagnosis:  Benzodiazapine withdrawal, Opiate dependence. DSM5: Schizophrenia Disorders:   Obsessive-Compulsive Disorders:   Trauma-Stressor Disorders:   Substance/Addictive Disorders:   Depressive Disorders:   Total Time spent with patient: 20 minutes    ADL's:  Impaired  Sleep: Poor  Appetite:  Poor  Suicidal Ideation:  denies Homicidal Ideation:  denies AEB (as evidenced by):  Psychiatric Specialty Exam: Physical Exam  ROS  Blood pressure 111/59, pulse 63, temperature 98.3 F (36.8 C), temperature source Oral, resp. rate 18, height 5\' 2"  (1.575 m), weight 76.204 kg (168 lb), last menstrual period 02/13/2014, SpO2 100 %.Body mass index is 30.72 kg/(m^2).  General Appearance: Disheveled  Eye SolicitorContact::  Fair  Speech:  Clear and Coherent  Volume:  Decreased  Mood:  depressed  Affect:  tearful  Thought Process:  Goal Directed  Orientation:  Full (Time, Place, and Person)  Thought Content:  WDL  Suicidal Thoughts:  No  Homicidal Thoughts:  No  Memory:  NA  Judgement:  Fair  Insight:  Present  Psychomotor Activity:  Restlessness and Tremor  Concentration:  Poor  Recall:  NA  Fund of Knowledge:NA  Language: Good  Akathisia:  No  Handed:  Right   AIMS (if indicated):     Assets:  Communication Skills Physical Health  Sleep:  Number of Hours: 6.75   Musculoskeletal: Strength & Muscle Tone: within normal limits Gait & Station: normal Patient leans: N/A  Current Medications: Current Facility-Administered Medications  Medication Dose Route Frequency Provider Last Rate Last Dose  . acetaminophen (TYLENOL) tablet 650 mg  650 mg Oral Q6H PRN Beau FannyJohn C Withrow, FNP   650 mg at 03/04/14 0845  . alum & mag hydroxide-simeth (MAALOX/MYLANTA) 200-200-20 MG/5ML suspension 30 mL  30 mL Oral Q4H PRN Beau FannyJohn C Withrow, FNP      . celecoxib (CELEBREX) capsule 200 mg  200 mg Oral BID Verne SpurrNeil Leverne Tessler, PA-C   200 mg at 03/04/14 [id number]  . chlordiazePOXIDE (LIBRIUM) capsule 25 mg  25 mg Oral TID Verne SpurrNeil Liset Mcmonigle, PA-C       Followed by  . [START ON 03/05/2014] chlordiazePOXIDE (LIBRIUM) capsule 25 mg  25 mg Oral BH-qamhs Verne SpurrNeil Joniel Graumann, PA-C       Followed by  . [START ON 03/07/2014] chlordiazePOXIDE (LIBRIUM) capsule 25 mg  25 mg Oral Daily Verne SpurrNeil Jyrah Blye, PA-C      . cloNIDine (CATAPRES - Dosed in mg/24 hr) patch 0.1 mg  0.1 mg Transdermal Weekly Verne SpurrNeil Floriene Jeschke, PA-C      . gabapentin (NEURONTIN) capsule 600 mg  600 mg Oral QID Beau FannyJohn C Withrow, FNP   600 mg at 03/04/14 0801  . hydrOXYzine (ATARAX/VISTARIL) tablet 25 mg  25 mg Oral Q6H PRN Everardo AllJohn C  Withrow, FNP   25 mg at 03/03/14 1650  . loperamide (IMODIUM) capsule 2-4 mg  2-4 mg Oral PRN Verne SpurrNeil Lynae Pederson, PA-C      . magnesium hydroxide (MILK OF MAGNESIA) suspension 30 mL  30 mL Oral Daily PRN Beau FannyJohn C Withrow, FNP      . meclizine (ANTIVERT) tablet 25 mg  25 mg Oral TID PRN Beau FannyJohn C Withrow, FNP      . methadone (DOLOPHINE) tablet 65 mg  65 mg Oral Daily Oletta DarterSalina Agarwal, MD   65 mg at 03/04/14 0800  . methocarbamol (ROBAXIN) tablet 500 mg  500 mg Oral Q8H PRN Beau FannyJohn C Withrow, FNP   500 mg at 03/04/14 0844  . mirtazapine (REMERON) tablet 7.5 mg  7.5 mg Oral QHS Verne SpurrNeil Wiletta Bermingham, PA-C   7.5 mg at 03/03/14 2105  . nicotine  (NICODERM CQ - dosed in mg/24 hours) patch 21 mg  21 mg Transdermal Daily Beau FannyJohn C Withrow, FNP   21 mg at 03/03/14 1647  . OLANZapine (ZYPREXA) tablet 15 mg  15 mg Oral QHS Beau FannyJohn C Withrow, FNP   15 mg at 03/03/14 2105  . ondansetron (ZOFRAN-ODT) disintegrating tablet 4 mg  4 mg Oral Q6H PRN Verne SpurrNeil Andrik Sandt, PA-C      . sertraline (ZOLOFT) tablet 25 mg  25 mg Oral Daily Verne SpurrNeil Senia Even, PA-C        Lab Results:  No results found for this or any previous visit (from the past 48 hour(s)).  Physical Findings: AIMS: Facial and Oral Movements Muscles of Facial Expression: None, normal Lips and Perioral Area: None, normal Jaw: None, normal Tongue: None, normal,Extremity Movements Upper (arms, wrists, hands, fingers): None, normal Lower (legs, knees, ankles, toes): None, normal, Trunk Movements Neck, shoulders, hips: None, normal, Overall Severity Severity of abnormal movements (highest score from questions above): None, normal Incapacitation due to abnormal movements: None, normal Patient's awareness of abnormal movements (rate only patient's report): No Awareness, Dental Status Current problems with teeth and/or dentures?: No Does patient usually wear dentures?: No  CIWA:  CIWA-Ar Total: 3 COWS:  COWS Total Score: 2  Treatment Plan Summary: Daily contact with patient to assess and evaluate symptoms and progress in treatment Medication management  Plan: NEW: 1. Will add Zoloft for depression and anxiety. 2. Declined to use Valium taper or to use Tramadol for pain. 3. Will add Clonidine patch for assistance with lower Methadone dose. 4. Set clear and appropriate boundaries.  Medical Decision Making Problem Points:  Established problem, worsening (2) Data Points:  Review of medication regiment & side effects (2) Review of new medications or change in dosage (2)  I certify that inpatient services furnished can reasonably be expected to improve the patient's condition.  Rona RavensNeil T. Charels Stambaugh  RPAC 10:10 AM 03/04/2014
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
## 2014-03-04 NOTE — Progress Notes (Signed)
Pt stated that her day started off bad, but it got better as the day went on. Pt stated that her goal is to continue to stay positive and not allow others to influence how she feels.
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
## 2014-03-04 NOTE — Progress Notes (Signed)
Patient ID: [name], female   DOB: 04/13/1985, 28 y.o.   MRN: [id number]  D: Patient pleasant on approach this am. Gives anxiety "8" but denies depression or feelings of hopelessness. Still reports withdrawal symptoms but patient on both methadone and librium. Her goal today is to stay positive. Proud that she has been able to lower her methadone dose from original dose over time.  A: Staff will monitor on q 15 minute checks, follow treatment plan, and give meds as ordered. R: Cooperative on the unit.

## 2014-03-05 DIAGNOSIS — F13239 Sedative, hypnotic or anxiolytic dependence with withdrawal, unspecified: Secondary | ICD-10-CM

## 2014-03-05 DIAGNOSIS — F1120 Opioid dependence, uncomplicated: Principal | ICD-10-CM

## 2014-03-05 NOTE — [facility] Group Notes (Signed)
Adult Group Therapy Note  Date:  03/05/2014 Time:  1:15 PM  Group Topic/Focus: Feelings about discharge and creating support network  Participation Level:  Did Not Attend  Additional Comments: The main focus of today's process group was to identify the patient's current support system and decide on other supports that can be put in place. An emphasis was placed on using counselor, doctor, therapy groups, 12-step groups, and problem-specific support groups to expand supports. There was also an extensive discussion about what constitutes a healthy support versus an unhealthy support.   Clide DalesHarrill, Recia Sons Campbell 03/05/2014

## 2014-03-05 NOTE — Progress Notes (Signed)
Patient ID: [name], female   DOB: 08/31/1985, 28 y.o.   MRN: [id number] D: Client visible on the unit, in dayroom watching TV and interacting with peers. Client reports day been "great, feel wonderful" "hadn't had any benzo's since this morning" "don't feel like the same [name], I did when I first came in, I was irritable and withdrawing" "the medication is working, they must have the right combination" Client goal was "stay positive and use coping skills" "I been using coping skills, exercising, using breathing technique, writing poems" A: Writer introduced self to client, provided emotional support, encouraged client to share poems if appropriate. Staff will monitor q4715min for safety. R: client is safe on the unit, attended group.

## 2014-03-05 NOTE — [facility] Group Notes (Signed)
[facility] Group Notes:  (Nursing/MHT/Case Management/Adjunct)  Date:  03/05/2014  Time:    Type of Therapy:  Healthy support systems RN group  Participation Level:  Active  Participation Quality:  Appropriate  Affect:  Appropriate  Cognitive:  Alert  Insight:  Improving  Engagement in Group:  Engaged  Modes of Intervention:  Discussion and Support  Summary of Progress/Problems:Attended group, participated appropriately. States groups have been helpful to her, and have helped her self esteem.  Wynona LunaBeck, Ericia Moxley K 03/05/2014, 2:48 PM
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
## 2014-03-05 NOTE — Progress Notes (Signed)
Adult Psychoeducational Group Note  Date:  03/05/2014 Time:  10:33 PM  Group Topic/Focus:  Wrap-Up Group:   The focus of this group is to help patients review their daily goal of treatment and discuss progress on daily workbooks.  Participation Level:  Active  Participation Quality:  Appropriate  Affect:  Appropriate  Cognitive:  Appropriate  Insight: Appropriate  Engagement in Group:  Engaged  Modes of Intervention:  Discussion  Additional Comments: The patient expressed that she has a good support system.The also said that her father and boyfriend was her support system.  Octavio Mannshigpen, Matraca Hunkins Lee 03/05/2014, 10:33 PM

## 2014-03-05 NOTE — Progress Notes (Signed)
[facility]  03/05/2014 12:58 PM [name] D Huge  MRN:  [id number] Subjective: Pt states she is better today. Denies depression. She is having some anxiety about her withdrawal symptoms. Withdrawal symptoms include- RLS, back ache, irritability, cravings. She slept well last night. Denies SI/HI, AVH. Denies SE from meds. States Zoloft makes her "feel like a new woman".  Wants to be d/c Tuesday if possible. Her plans after d/c are to discontinue contact with her boyfriend, find a job and go to [employer] meetings.  Objective: Her COWS scores are trending down and she is noted to have slept 6.75 hours. Zoloft is helping her depression, and the clonidine patch for her withdrawal (just recently lower her Methadone dose to 65mg ) is also helping. She is reminded that she can also use her coping skills to help her relax and she is also cautioned against providing other patients with medical advice.   Diagnosis:  Benzodiazapine withdrawal, Opiate dependence. DSM5: Schizophrenia Disorders:   Obsessive-Compulsive Disorders:   Trauma-Stressor Disorders:   Substance/Addictive Disorders:   Depressive Disorders:   Total Time spent with patient: 20 minutes    ADL's:  Intact  Sleep: Good  Appetite:  Good  Suicidal Ideation:  denies Homicidal Ideation:  denies AEB (as evidenced by):  Psychiatric Specialty Exam: Physical Exam  Review of Systems  Musculoskeletal: Positive for back pain.  Psychiatric/Behavioral: Positive for substance abuse. Negative for depression, suicidal ideas and hallucinations. The patient is nervous/anxious. The patient does not have insomnia.     Blood pressure 108/42, pulse 62, temperature 98 F (36.7 C), temperature source Oral, resp. rate 18, height 5\' 2"  (1.575 m), weight 76.204 kg (168 lb), last menstrual period 02/13/2014, SpO2 100 %.Body mass index is 30.72 kg/(m^2).  General Appearance: Neat  Eye Contact::  Fair  Speech:  Clear and Coherent  Volume:  Normal   Mood:  euthymic  Affect:  Full range  Thought Process:  Goal Directed  Orientation:  Full (Time, Place, and Person)  Thought Content:  WDL  Suicidal Thoughts:  No  Homicidal Thoughts:  No  Memory:  Immediate;   Fair Recent;   Fair Remote;   Fair  Judgement:  Fair  Insight:  Present  Psychomotor Activity:  Normal  Concentration:  Good  Recall:  NA  Fund of Knowledge:NA  Language: Good  Akathisia:  No  Handed:  Right  AIMS (if indicated):     Assets:  Communication Skills Physical Health  Sleep:  Number of Hours: 6.75   Musculoskeletal: Strength & Muscle Tone: within normal limits Gait & Station: normal Patient leans: N/A  Current Medications: Current Facility-Administered Medications  Medication Dose Route Frequency Provider Last Rate Last Dose  . acetaminophen (TYLENOL) tablet 650 mg  650 mg Oral Q6H PRN Beau FannyJohn C Withrow, FNP   650 mg at 03/04/14 0845  . alum & mag hydroxide-simeth (MAALOX/MYLANTA) 200-200-20 MG/5ML suspension 30 mL  30 mL Oral Q4H PRN Beau FannyJohn C Withrow, FNP      . celecoxib (CELEBREX) capsule 200 mg  200 mg Oral BID Verne SpurrNeil Mashburn, PA-C   200 mg at 03/05/14 0801  . chlordiazePOXIDE (LIBRIUM) capsule 25 mg  25 mg Oral BH-qamhs Verne SpurrNeil Mashburn, PA-C       Followed by  . [START ON 03/07/2014] chlordiazePOXIDE (LIBRIUM) capsule 25 mg  25 mg Oral Daily Verne SpurrNeil Mashburn, PA-C      . cloNIDine (CATAPRES - Dosed in mg/24 hr) patch 0.1 mg  0.1 mg Transdermal Weekly Verne SpurrNeil Mashburn, PA-C  0.1 mg at 03/04/14 1113  . gabapentin (NEURONTIN) capsule 600 mg  600 mg Oral QID Beau FannyJohn C Withrow, FNP   600 mg at 03/05/14 1132  . hydrOXYzine (ATARAX/VISTARIL) tablet 25 mg  25 mg Oral Q6H PRN Beau FannyJohn C Withrow, FNP   25 mg at 03/04/14 1816  . loperamide (IMODIUM) capsule 2-4 mg  2-4 mg Oral PRN Verne SpurrNeil Mashburn, PA-C   2 mg at 03/05/14 0804  . magnesium hydroxide (MILK OF MAGNESIA) suspension 30 mL  30 mL Oral Daily PRN Beau FannyJohn C Withrow, FNP      . meclizine (ANTIVERT) tablet 25 mg  25 mg Oral TID  PRN Beau FannyJohn C Withrow, FNP      . methadone (DOLOPHINE) tablet 65 mg  65 mg Oral Daily Oletta DarterSalina Jovane Foutz, MD   65 mg at 03/05/14 0800  . methocarbamol (ROBAXIN) tablet 500 mg  500 mg Oral Q8H PRN Beau FannyJohn C Withrow, FNP   500 mg at 03/05/14 1018  . mirtazapine (REMERON) tablet 7.5 mg  7.5 mg Oral QHS Verne SpurrNeil Mashburn, PA-C   7.5 mg at 03/04/14 2103  . nicotine (NICODERM CQ - dosed in mg/24 hours) patch 21 mg  21 mg Transdermal Daily Beau FannyJohn C Withrow, FNP   21 mg at 03/04/14 1703  . OLANZapine (ZYPREXA) tablet 15 mg  15 mg Oral QHS Beau FannyJohn C Withrow, FNP   15 mg at 03/04/14 2103  . ondansetron (ZOFRAN-ODT) disintegrating tablet 4 mg  4 mg Oral Q6H PRN Verne SpurrNeil Mashburn, PA-C      . sertraline (ZOLOFT) tablet 25 mg  25 mg Oral Daily Verne SpurrNeil Mashburn, PA-C   25 mg at 03/05/14 [id number]    Lab Results:  No results found for this or any previous visit (from the past 48 hour(s)).  Physical Findings: AIMS: Facial and Oral Movements Muscles of Facial Expression: None, normal Lips and Perioral Area: None, normal Jaw: None, normal Tongue: None, normal,Extremity Movements Upper (arms, wrists, hands, fingers): None, normal Lower (legs, knees, ankles, toes): None, normal, Trunk Movements Neck, shoulders, hips: None, normal, Overall Severity Severity of abnormal movements (highest score from questions above): None, normal Incapacitation due to abnormal movements: None, normal Patient's awareness of abnormal movements (rate only patient's report): No Awareness, Dental Status Current problems with teeth and/or dentures?: No Does patient usually wear dentures?: No  CIWA:  CIWA-Ar Total: 3 COWS:  COWS Total Score: 0  Treatment Plan Summary: Daily contact with patient to assess and evaluate symptoms and progress in treatment Medication management  Plan: NEW: 1. Zoloft for depression and anxiety. 2. Declined to use Valium taper or to use Tramadol for pain. 3. Clonidine patch for assistance with lower Methadone dose. 4. Set  clear and appropriate boundaries. 5. Possible d/c on Tuesday if symptoms continue to improve Medical Decision Making Problem Points:  Established problem, stable/improving (1) and Review of psycho-social stressors (1) Data Points:  Review of medication regiment & side effects (2)  I certify that inpatient services furnished can reasonably be expected to improve the patient's condition.    Oletta DarterAgarwal, Briton Sellman MD 12:58 PM 03/05/2014

## 2014-03-06 DIAGNOSIS — F319 Bipolar disorder, unspecified: Secondary | ICD-10-CM

## 2014-03-06 DIAGNOSIS — F1190 Opioid use, unspecified, uncomplicated: Secondary | ICD-10-CM

## 2014-03-06 DIAGNOSIS — F4310 Post-traumatic stress disorder, unspecified: Secondary | ICD-10-CM

## 2014-03-06 DIAGNOSIS — F1390 Sedative, hypnotic, or anxiolytic use, unspecified, uncomplicated: Secondary | ICD-10-CM

## 2014-03-06 MED ORDER — NICOTINE 21 MG/24HR TD PT24
21.0000 mg | MEDICATED_PATCH | Freq: Every day | TRANSDERMAL | Status: DC
  Administered 2014-03-06: 21 mg via TRANSDERMAL
  Filled 2014-03-06 (×2): qty 1

## 2014-03-06 MED ORDER — LAMOTRIGINE 25 MG PO TABS
25.0000 mg | ORAL_TABLET | Freq: Every day | ORAL | Status: DC
  Administered 2014-03-06 – 2014-03-07 (×2): 25 mg via ORAL
  Filled 2014-03-06 (×3): qty 1

## 2014-03-06 NOTE — Progress Notes (Signed)
Patient ID: [name], female   DOB: 05/08/1985, 28 y.o.   MRN: [id number] D: Client visible on the unit, interacts well with staff and peers. Client reports anxiety as "7" of 10, relates this to discharge plans tomorrow. 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 Client reports [facility]. A: Writer encouraged client to continue to use coping skill she has gained post discharge. Staff will monitor q6215min for safety. R: Client is safe on the unit, attended group.
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
## 2014-03-06 NOTE — Plan of Care (Signed)
Problem: Alteration in mood & ability to function due to Goal: STG-Patient will comply with prescribed medication regimen (Patient will comply with prescribed medication regimen)  Outcome: Progressing Client compliant with medication regime, reports "Medication is work, they must have the right combination"

## 2014-03-06 NOTE — [facility] Suicide Risk Assessment (Signed)
[facility] INPATIENT:  Family/Significant Other Suicide Prevention Education  Suicide Prevention Education:  Education Completed; [name] [phone]), (name of family member/significant other) has been identified by the patient as the family member/significant other with whom the patient will be residing, and identified as the person(s) who will aid the patient in the event of a mental health crisis (suicidal ideations/suicide attempt).  With written consent from the patient, the family member/significant other has been provided the following suicide prevention education, prior to the and/or following the discharge of the patient.  The suicide prevention education provided includes the following:  Suicide risk factors  Suicide prevention and interventions  National Suicide Hotline telephone number  [facility] assessment telephone number  [facility] Emergency Assistance 911  [facility] and/or Residential Mobile Crisis Unit telephone number  Request made of family/significant other to:  Remove weapons (e.g., guns, rifles, knives), all items previously/currently identified as safety concern.    Remove drugs/medications (over-the-counter, prescriptions, illicit drugs), all items previously/currently identified as a safety concern.  The family member/significant other verbalizes understanding of the suicide prevention education information provided.  The family member/significant other agrees to remove the items of safety concern listed above.  Family member agrees to remove pellet gun from house and secure in locked shed.  Will also ask grandmother in home to secure medications.  Family member will pick up patient tomorrow at 12 noon.  [name] 03/06/2014, 2:20 PM

## 2014-03-06 NOTE — Progress Notes (Addendum)
D:  Patient's self inventory sheet, patient slept good last night, sleep medication is helpful.  Good appetite, normal energy level, good concentration.  Denied depression and hopeless, rated anxiety #8.  Has felt agitation today.  Denied SI.  Back pain continues, worst pain #5, pain meds are helpful.  Goal is to stay positive and use coping skills.  Does have discharge plans.  Stated she needs medicine for the weeks after I get out til I get medicaid. A:  Medications administered per MD orders.  Emotional support and encouragement given patient. R:  Denied SI and HI.  Denied A/V hallucinations.  Contracts for safety.  Safety maintained with 15 minute checks.  After patient talked to MD about discharge plans tomorrow, patient stated she needed medication for anxiety and back pain.  That she was worried about discharge and not being in a controlled environment.  She is very concerned about her medications after discharge.

## 2014-03-06 NOTE — Plan of Care (Signed)
Problem: Alteration in mood Goal: LTG-Pt's behavior demonstrates decreased signs of depression (Patient's behavior demonstrates decreased signs of depression to the point the patient is safe to return home and continue treatment in an outpatient setting)  Outcome: Progressing Client reports"I feel great, I feel wonderful"Client reports depression has decreased.

## 2014-03-06 NOTE — Plan of Care (Signed)
Problem: Consults Goal: Substance Abuse Patient Education See Patient Education Module for education specifics.  Outcome: Completed/Met Date Met:  03/06/14 Nurse discussed with patient today.

## 2014-03-06 NOTE — Tx Team (Signed)
Interdisciplinary Treatment Plan Update (Adult)   Date: 03/06/2014   Time Reviewed: 9:00AM Progress in Treatment:  Attending groups: Participating in groups:   Taking medication as prescribed: Yes  Tolerating medication: Yes  Family/Significant othe contact made: Not yet. SPE required for this pt.  Patient understands diagnosis: Yes, AEB seeking treatment for benzo detox, SI, depression/mood instability, and for medication stabilization/methadone maintenance 65mg  daily.  Discussing patient identified problems/goals with staff: Yes  Medical problems stabilized or resolved: Yes  Denies suicidal/homicidal ideation: Yes during group/self report.  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 Pt reports difficulty paying for meds and would like 2 week sample of meds if possible. Pt also requesting ACT referral. CSW assessing. [facility] [place] [place]  Additional comments:  [facility] since her daughter was taken from her by DSS. She was evaluated and sent to the Obs unit and was noted to be in acute withdrawal from both benzodiazepines as well as methadone. She reported increasing depression and not being able to afford her zyprexa which had been helping with her symptoms. Reason for Continuation of Hospitalization: Mood stabilization Medication management/methadone maintenance 65mg  daily  Estimated length of stay: 1 day (d/c scheduled for Tuesday 12/29) For review of initial/current patient goals, please see plan of care.  Attendees:  Patient:    Family:    Physician: Dr. Elna BreslowEappen 03/06/2014   Nursing: Dorathy DaftVivian, Beverly, Foy Guadalajarahrista RN 03/06/2014   Clinical Social Worker Micajah Dennin Smart, LCSWA  03/06/2014   Other: Santa GeneraAnne Cunningham, LCSW 03/06/2014   Other: Vikki PortsValerie,  TCT Coordinator  03/06/2014   Other:    Other:    Scribe for Treatment Team:  Trula SladeHeather Smart LCSWA 03/06/2014 12:44 PM

## 2014-03-06 NOTE — Plan of Care (Signed)
Problem: Alteration in mood & ability to function due to Goal: STG-Patient will attend groups Outcome: Progressing Client reports she has attended all groups today, "been using my coping skills, exercising, breathing, writing a poem."

## 2014-03-06 NOTE — [facility] Group Notes (Signed)
[facility]   03/06/2014 12:40 PM  Participation Quality:  Appropriate   Mood/Affect:  Appropriate  Depression Rating:  0  Anxiety Rating:  0  Thoughts of Suicide:  No Will you contract for safety?   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 She is reporting minimal withdrawal symptoms. Pt reports problems with ability to pay for meds--CSW assessing. Pt requesting ACT team. [facility] [place]--pt stated that it IS NOT [facility]. CSW assessing.   Transportation Means: exboyfriend will pick her up tomorrow at d/c.   Supports: boyfriend, uncle/some family   Smart, Lebron QuamHeather LCSWA

## 2014-03-06 NOTE — [facility] Group Notes (Signed)
[facility] LCSW Group Therapy  03/06/2014 3:47 PM  Type of Therapy:  Group Therapy  Participation Level:  Did Not Attend-pt invited to attend, stated that she was tired and needed to rest.   Summary of Progress/Problems: Today's Topic: Overcoming Obstacles. Pt identified obstacles faced currently and processed barriers involved in overcoming these obstacles. Pt identified steps necessary for overcoming these obstacles and explored motivation (internal and external) for facing these difficulties head on. Pt further identified one area of concern in their lives and chose a skill of focus pulled from their "toolbox."    Smart, [place] Monte ForestHeather LCSWA  03/06/2014, 3:47 PM

## 2014-03-07 DIAGNOSIS — F3132 Bipolar disorder, current episode depressed, moderate: Secondary | ICD-10-CM

## 2014-03-07 DIAGNOSIS — F4310 Post-traumatic stress disorder, unspecified: Secondary | ICD-10-CM | POA: Insufficient documentation

## 2014-03-07 DIAGNOSIS — F1320 Sedative, hypnotic or anxiolytic dependence, uncomplicated: Secondary | ICD-10-CM | POA: Diagnosis present

## 2014-03-07 DIAGNOSIS — F3160 Bipolar disorder, current episode mixed, unspecified: Secondary | ICD-10-CM | POA: Diagnosis present

## 2014-03-07 DIAGNOSIS — F1120 Opioid dependence, uncomplicated: Secondary | ICD-10-CM | POA: Diagnosis present

## 2014-03-07 LAB — LIPID PANEL
CHOL/HDL RATIO: 4.7 ratio
Cholesterol: 208 mg/dL — ABNORMAL HIGH (ref 0–200)
HDL: 44 mg/dL (ref >39)
LDL Cholesterol: 126 mg/dL — ABNORMAL HIGH (ref 0–99)
Triglycerides: 189 mg/dL — ABNORMAL HIGH (ref <150)
VLDL: 38 mg/dL (ref 0–40)

## 2014-03-07 LAB — BASIC METABOLIC PANEL
Anion gap: 5 (ref 5–15)
BUN: 18 mg/dL (ref 6–23)
CALCIUM: 9.7 mg/dL (ref 8.4–10.5)
CO2: 29 mmol/L (ref 19–32)
Chloride: 105 mEq/L (ref 96–112)
Creatinine, Ser: 0.7 mg/dL (ref 0.50–1.10)
GFR calc Af Amer: >90 mL/min (ref >90)
GFR calc non Af Amer: >90 mL/min (ref >90)
GLUCOSE: 93 mg/dL (ref 70–99)
Potassium: 4.2 mmol/L (ref 3.5–5.1)
Sodium: 139 mmol/L (ref 135–145)

## 2014-03-07 LAB — HEMOGLOBIN A1C
HEMOGLOBIN A1C: 5.3 % (ref <5.7)
Mean Plasma Glucose: 105 mg/dL (ref <117)

## 2014-03-07 LAB — TSH: TSH: 1.023 u[IU]/mL (ref 0.350–4.500)

## 2014-03-07 MED ORDER — CLONIDINE HCL 0.1 MG/24HR TD PTWK: 0.1000 mg | MEDICATED_PATCH | TRANSDERMAL | Status: DC

## 2014-03-07 MED ORDER — MIRTAZAPINE 7.5 MG PO TABS: 7.5000 mg | ORAL_TABLET | Freq: Every day | ORAL | Status: DC

## 2014-03-07 MED ORDER — GABAPENTIN 600 MG PO TABS
600.0000 mg | ORAL_TABLET | Freq: Four times a day (QID) | ORAL | Status: DC
  Filled 2014-03-07: qty 56

## 2014-03-07 MED ORDER — LAMOTRIGINE 25 MG PO TABS: 25.0000 mg | ORAL_TABLET | Freq: Every day | ORAL | Status: DC

## 2014-03-07 MED ORDER — OLANZAPINE 15 MG PO TABS: 15.0000 mg | ORAL_TABLET | Freq: Every day | ORAL | Status: DC

## 2014-03-07 MED ORDER — GABAPENTIN 300 MG PO CAPS: 600.0000 mg | ORAL_CAPSULE | Freq: Four times a day (QID) | ORAL | Status: DC

## 2014-03-07 MED ORDER — NICOTINE 21 MG/24HR TD PT24: 21.0000 mg | MEDICATED_PATCH | Freq: Every day | TRANSDERMAL | Status: DC

## 2014-03-07 MED ORDER — POLYETHYLENE GLYCOL 3350 17 G PO PACK: 17.0000 g | PACK | Freq: Two times a day (BID) | ORAL | Status: DC

## 2014-03-07 MED ORDER — HYDROXYZINE HCL 25 MG PO TABS: 25.0000 mg | ORAL_TABLET | Freq: Four times a day (QID) | ORAL | Status: DC | PRN

## 2014-03-07 MED ORDER — SENNOSIDES-DOCUSATE SODIUM 8.6-50 MG PO TABS: 1.0000 | ORAL_TABLET | Freq: Every day | ORAL | Status: DC

## 2014-03-07 MED ORDER — CELECOXIB 200 MG PO CAPS: 200.0000 mg | ORAL_CAPSULE | Freq: Two times a day (BID) | ORAL | Status: DC

## 2014-03-07 MED ORDER — MECLIZINE HCL 25 MG PO TABS: 25.0000 mg | ORAL_TABLET | Freq: Three times a day (TID) | ORAL | Status: DC | PRN

## 2014-03-07 NOTE — Progress Notes (Signed)
[facility] :  Will you be returning to the same living situation after discharge: Yes,  no. 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  Patient to Follow up at: Follow-up Information    [facility] [place] On 03/09/2014.   Why:  Walk in appt (for assessment) at 8:00AM. Referral number: [id number]. Referral made for ACT services-[facility] called back and gave this appt, stating they will see you for assessment.    Contact information:   [address] [place], [place] [address] Phone: [phone] Fax: [phone]      [facility] .   Why:  Walk in between 8am-3pm for hospital follow-up/medication management/assessment for therapy services. At this time, no IPRS funding available for ACT services per [employer] [facility]. Once approved for Medicaid, call [facility] direclty to get set up with ACT   Contact information:   [address] [place], [place] [address] Phone: [phone] Fax: [phone]      [facility].   Why:  Follow-up with treatment center daily for methadone treatment following discharge.    Contact information:   [address] [place], [place] [address] Phone: [phone] Fax: [phone]      Patient denies SI/HI:   Yes,  during group/self report.     Safety Planning and Suicide Prevention discussed:  Yes,  SPE completed with pt's (ex)boyfriend, Tim. SPI pamphlet provided to pt and she was encouraged to share information with support network, ask questions, and talk about any concerns relating to SPE.   Smart, Aella Ronda LCSWA  03/07/2014, 9:50 AM

## 2014-03-07 NOTE — Progress Notes (Signed)
Patient discharged per physician order; patient denies SI/HI and A/V hallucinations; patient received samples, precriptions, and copy of AVS after it was reviewed; patient had no other questions or concerns at this time; patient verbalized and signed that she received all belongings; patient left the unit ambulatory

## 2014-03-07 NOTE — [facility] Group Notes (Signed)
[facility] Group Notes:  (Nursing/MHT/Case Management/Adjunct)  Date:  03/07/2014  Time:  0945am  Type of Therapy:  Recover RN group  Participation Level:  Did Not Attend  Participation Quality:  Did not attend  Affect:  Did not attend  Cognitive:  Did not attend  Insight:  None  Engagement in Group:  Did not attend  Modes of Intervention:  Discussion, Education and Support  Summary of Progress/Problems: Patient did not attend group and was preparing for discharge with RN during group time.  Lendell CapriceGuthrie, Rayaan Garguilo A 03/07/2014, 3:13 PM

## 2014-03-07 NOTE — Discharge Instructions (Signed)
Advised and discussed with patient to see PCP and have lipids checked.  Informed the lips levels were not within normal limits.  Discussed lifestyle and diet changes to help improve levels.

## 2014-03-07 NOTE — [facility] Suicide Risk Assessment (Signed)
   Demographic Factors:  Caucasian  Total Time spent with patient: 45 minutes  Psychiatric Specialty Exam: Physical Exam  ROS  Blood pressure 108/57, pulse 64, temperature 97.8 F (36.6 C), temperature source Oral, resp. rate 18, height 5\' 2"  (1.575 m), weight 76.204 kg (168 lb), last menstrual period 02/13/2014, SpO2 100 %.Body mass index is 30.72 kg/(m^2).  General Appearance: Casual  Eye Contact::  Fair  Speech:  Normal Rate  Volume:  Normal  Mood:  Euthymic  Affect:  Appropriate  Thought Process:  Coherent  Orientation:  Full (Time, Place, and Person)  Thought Content:  WDL  Suicidal Thoughts:  No  Homicidal Thoughts:  No  Memory:  Immediate;   Fair Recent;   Fair Remote;   Fair  Judgement:  Fair  Insight:  Fair  Psychomotor Activity:  Normal  Concentration:  Fair  Recall:  [employer]  Fund of Knowledge:Fair  Language: Fair  Akathisia:  No  Handed:  Right  AIMS (if indicated):     Assets:  Communication Skills Desire for Improvement  Sleep:  Number of Hours: 6.25    Musculoskeletal: Strength & Muscle Tone: within normal limits Gait & Station: normal Patient leans: N/A   Mental Status Per Nursing Assessment::   On Admission:     Current Mental Status by Physician: Patient denies SI/HI/AH/VH.  Loss Factors: NA  Historical Factors: Impulsivity  Risk Reduction Factors:   Positive social support, Positive therapeutic relationship and Positive coping skills or problem solving skills  Continued Clinical Symptoms:  Alcohol/Substance Abuse/Dependencies Previous Psychiatric Diagnoses and Treatments  Cognitive Features That Contribute To Risk:  Polarized thinking    Suicide Risk:  Minimal: No identifiable suicidal ideation.     Discharge Diagnoses:  Primary Psychiatric Diagnosis: Bipolar disorder type I mixed ,moderate,without psychosis   Secondary Psychiatric Diagnosis: PTSD Sedative hypnotic and anxiolytic use disorder ,severe (BZD) Opioid use  disorder (on methadone)   Non Psychiatric Diagnosis: See PMH  Past Medical History  Diagnosis Date  . Drug abuse   . GERD (gastroesophageal reflux disease)   . Depression   . Arthritis   . Seizures   . Chronic pain   . Methadone maintenance therapy patient   . Anxiety     Plan Of Care/Follow-up recommendations:  Activity:  NO RESTRICTIONS Diet:  REGULAR  Is patient on multiple antipsychotic therapies at discharge:  No   Has Patient had three or more failed trials of antipsychotic monotherapy by history:  No  Recommended Plan for Multiple Antipsychotic Therapies: NA    Suzzanne Brunkhorst md 03/07/2014, 9:35 AM

## 2014-03-07 NOTE — Discharge Summary (Signed)
Physician Discharge Summary Note  Patient:  [name] is an 28 y.o., female MRN:  [id number] DOB:  1985-04-14 Patient phone:  [phone] (home)  Patient address:   [address] [place] [address],  Total Time spent with patient: 30 minutes  Date of Admission:  03/01/2014 Date of Discharge: 03/07/2014  Reason for Admission:  Depression, withdrawal from benzodiazepines  Discharge Diagnoses:  Bipolar disorder type I mixed ,moderate,without psychosis  Principal Problem:   Benzodiazepine dependence Active Problems:   Bipolar I disorder, most recent episode mixed   Opioid dependence   PTSD (post-traumatic stress disorder)   Opioid use disorder, severe, dependence   Severe benzodiazepine use disorder   Psychiatric Specialty Exam: Physical Exam  Vitals reviewed. Neurological: She displays abnormal reflex.  Psychiatric: She has a normal mood and affect. Her speech is normal and behavior is normal. Judgment and thought content normal. Cognition and memory are normal.    Review of Systems  Constitutional: Negative.   HENT: Negative.   Eyes: Negative.   Respiratory: Negative.   Cardiovascular: Negative.   Gastrointestinal: Negative.   Genitourinary: Negative.   Musculoskeletal: Negative.   Skin: Negative.   Neurological: Negative.   Endo/Heme/Allergies: Negative.   Psychiatric/Behavioral: Positive for depression. Negative for suicidal ideas, hallucinations, memory loss and substance abuse. The patient is nervous/anxious and has insomnia.     Blood pressure 108/57, pulse 64, temperature 97.8 F (36.6 C), temperature source Oral, resp. rate 18, height 5' 2"  (1.575 m), weight 76.204 kg (168 lb), last menstrual period 02/13/2014, SpO2 100 %.Body mass index is 30.72 kg/(m^2).   Past Psychiatric History: Diagnosis:  Depression  Hospitalizations:  Denies  Outpatient Care:  Denies  Substance Abuse Care:  Denies  Self-Mutilation:  Denies  Suicidal Attempts:  Denies   Violent Behaviors:  Denies   Musculoskeletal: Strength & Muscle Tone: within normal limits Gait & Station: normal Patient leans: N/A  DSM5: Discharge Diagnoses: Primary Psychiatric Diagnosis: Bipolar disorder type I mixed ,moderate,without psychosis  Secondary Psychiatric Diagnosis: PTSD Sedative hypnotic and anxiolytic use disorder ,severe (BZD) Opioid use disorder (on methadone)  Non Psychiatric Diagnosis: See PMH  Level of Care:  OP  Hospital Course:  Patient presented to the [place] [facility] reporting increasing depression since her daughter was taken from her by DSS. She was evaluated and sent to the Obs unit and was noted to be in acute withdrawal from both benzodiazepines.  Patient was admitted for crisis management.  She was stabilized with Zyprexa 15 mg for mood lability, Gabapentin 600 mg for pain.  We also resumed Methadone 65 mg per scheduled from outpatient Methadone clinic.  Previous Zoloft was discontinued and Lamictal 25 mg for mood stabilization.  Keala tolerated medication regimen and did not report adverse side effects.  Her moods gradullay improved and she exhibited less anxiety and less depressive moods.  Discussed th need to get established with a PCP for general health maintenance and to follow with abnormal cholesterol values.  At time of discharge, she rated both depression and anxiety levels to be manageable and minimal. She was able to identify the triggers of emotional crises.  Denies physiological concerns/SI/HI/AVH at time of discharge.  She has satisfactory support network and home environment and will adhere to medication compliance and outpatient treatment.     Consults:  psychiatry  Significant Diagnostic Studies:  labs: per ED  Discharge Vitals:   Blood pressure 108/57, pulse 64, temperature 97.8 F (36.6 C), temperature source Oral, resp. rate 18, height 5' 2"  (1.575  m), weight 76.204 kg (168 lb), last menstrual period 02/13/2014, SpO2 100 %. Body mass  index is 30.72 kg/(m^2). Lab Results:   Results for orders placed or performed during the hospital encounter of 03/01/14 (from the past 72 hour(s))  Basic metabolic panel     Status: None   Collection Time: 03/07/14  6:15 AM  Result Value Ref Range   Sodium 139 135 - 145 mmol/L    Comment: Please note change in reference range.   Potassium 4.2 3.5 - 5.1 mmol/L    Comment: Please note change in reference range.   Chloride 105 96 - 112 mEq/L   CO2 29 19 - 32 mmol/L   Glucose, Bld 93 70 - 99 mg/dL   BUN 18 6 - 23 mg/dL   Creatinine, Ser 0.70 0.50 - 1.10 mg/dL   Calcium 9.7 8.4 - 10.5 mg/dL   GFR calc non Af Amer >90 >90 mL/min   GFR calc Af Amer >90 >90 mL/min    Comment: (NOTE) The eGFR has been calculated using the CKD EPI equation. This calculation has not been validated in all clinical situations. eGFR's persistently <90 mL/min signify possible Chronic Kidney Disease.    Anion gap 5 5 - 15    Comment: Performed at [facility]  TSH     Status: None   Collection Time: 03/07/14  6:15 AM  Result Value Ref Range   TSH 1.023 0.350 - 4.500 uIU/mL    Comment: Performed at [facility]  Lipid panel     Status: Abnormal   Collection Time: 03/07/14  6:15 AM  Result Value Ref Range   Cholesterol 208 (H) 0 - 200 mg/dL   Triglycerides 189 (H) <150 mg/dL   HDL 44 >39 mg/dL   Total CHOL/HDL Ratio 4.7 RATIO   VLDL 38 0 - 40 mg/dL   LDL Cholesterol 126 (H) 0 - 99 mg/dL    Comment:        Total Cholesterol/HDL:CHD Risk Coronary Heart Disease Risk Table                     Men   Women  1/2 Average Risk   3.4   3.3  Average Risk       5.0   4.4  2 X Average Risk   9.6   7.1  3 X Average Risk  23.4   11.0        Use the calculated Patient Ratio above and the CHD Risk Table to determine the patient's CHD Risk.        ATP III CLASSIFICATION (LDL):  <100     mg/dL   Optimal  100-129  mg/dL   Near or Above                    Optimal  130-159  mg/dL    Borderline  160-189  mg/dL   High  >190     mg/dL   Very High Performed at [facility]     Physical Findings: AIMS: Facial and Oral Movements Muscles of Facial Expression: None, normal Lips and Perioral Area: None, normal Jaw: None, normal Tongue: None, normal,Extremity Movements Upper (arms, wrists, hands, fingers): None, normal Lower (legs, knees, ankles, toes): None, normal, Trunk Movements Neck, shoulders, hips: None, normal, Overall Severity Severity of abnormal movements (highest score from questions above): None, normal Incapacitation due to abnormal movements: None, normal Patient's awareness of abnormal movements (rate only patient's  report): No Awareness, Dental Status Current problems with teeth and/or dentures?: No Does patient usually wear dentures?: No  CIWA:  CIWA-Ar Total: 3 COWS:  COWS Total Score: 0  Psychiatric Specialty Exam: See Psychiatric Specialty Exam and Suicide Risk Assessment completed by Attending Physician prior to discharge.  Discharge destination:  Home  Is patient on multiple antipsychotic therapies at discharge:  No   Has Patient had three or more failed trials of antipsychotic monotherapy by history:  No  Recommended Plan for Multiple Antipsychotic Therapies: NA     Medication List    STOP taking these medications        busPIRone 10 MG tablet  Commonly known as:  BUSPAR     clonazePAM 1 MG tablet  Commonly known as:  KLONOPIN     DULoxetine 30 MG capsule  Commonly known as:  CYMBALTA     gabapentin 600 MG tablet  Commonly known as:  NEURONTIN  Replaced by:  gabapentin 300 MG capsule     gabapentin 800 MG tablet  Commonly known as:  NEURONTIN     methadone 10 MG/ML solution  Commonly known as:  DOLOPHINE     nicotine 7 mg/24hr patch  Commonly known as:  NICODERM CQ - dosed in mg/24 hr  Replaced by:  nicotine 21 mg/24hr patch      TAKE these medications      Indication   celecoxib 200 MG capsule  Commonly  known as:  CELEBREX  Take 1 capsule (200 mg total) by mouth 2 (two) times daily.   Indication:  Acute Pain     cloNIDine 0.1 mg/24hr patch  Commonly known as:  CATAPRES - Dosed in mg/24 hr  Place 1 patch (0.1 mg total) onto the skin once a week.   Indication:  High Blood Pressure     gabapentin 300 MG capsule  Commonly known as:  NEURONTIN  Take 2 capsules (600 mg total) by mouth 4 (four) times daily.   Indication:  Pain     hydrOXYzine 25 MG tablet  Commonly known as:  ATARAX/VISTARIL  Take 1 tablet (25 mg total) by mouth every 6 (six) hours as needed for anxiety.   Indication:  Anxiety Neurosis     lamoTRIgine 25 MG tablet  Commonly known as:  LAMICTAL  Take 1 tablet (25 mg total) by mouth daily.   Indication:  Depression     meclizine 25 MG tablet  Commonly known as:  ANTIVERT  Take 1 tablet (25 mg total) by mouth 3 (three) times daily as needed for dizziness or nausea.   Indication:  Motion Sickness, Nausea and Vomiting     mirtazapine 7.5 MG tablet  Commonly known as:  REMERON  Take 1 tablet (7.5 mg total) by mouth at bedtime.   Indication:  Trouble Sleeping     nicotine 21 mg/24hr patch  Commonly known as:  NICODERM CQ - dosed in mg/24 hours  Place 1 patch (21 mg total) onto the skin daily at 6 (six) AM.   Indication:  Nicotine Addiction     OLANZapine 15 MG tablet  Commonly known as:  ZYPREXA  Take 1 tablet (15 mg total) by mouth at bedtime.   Indication:  Mood Stabilization     polyethylene glycol packet  Commonly known as:  MIRALAX / GLYCOLAX  Take 17 g by mouth 2 (two) times daily. Hold if you develop diarrhea   Indication:  Constipation     senna-docusate 8.6-50 MG per tablet  Commonly  known as:  Senokot-S  Take 1 tablet by mouth at bedtime. Hold if you develop diarrhea   Indication:  Constipation       Follow-up Information    Follow up with [facility] On 03/09/2014.   Why:  Walk in appt (for assessment) at 8:00AM. Referral number:  [id number]. Referral made for ACT services-[facility] called back and gave this appt, stating they will see you for assessment.    Contact information:   [address] [place], [place] [address] Phone: [phone] Fax: [phone]      Follow up with [facility] .   Why:  Walk in between 8am-3pm for hospital follow-up/medication management/assessment for therapy services. At this time, no IPRS funding available for ACT services per [employer] at [place]. Once approved for Medicaid, call [facility] direclty to get set up with ACT   Contact information:   [address] [place] [address], [place] [address] Phone: [phone] Fax: [phone]      Follow up with [facility].   Why:  Follow-up with treatment center daily for methadone treatment following discharge.    Contact information:   [address] [place], [place] [address] Phone: [phone] Fax: [phone]      Follow-up recommendations:  Activity:  as tol, diet as tol  Comments:  1.  Take all your medications as prescribed.              2.  Report any adverse side effects to outpatient provider.                       3.  Patient instructed to not use alcohol or illegal drugs while on prescription medicines.            4.  In the event of worsening symptoms, instructed patient to call 911, the crisis hotline or go to nearest emergency room for evaluation of symptoms.  Total Discharge Time:  Greater than 30 minutes.  SignedKerrie Buffalo MAY, AGNP-BC 03/07/2014, 10:09 AM

## 2014-03-09 NOTE — Progress Notes (Signed)
Patient Discharge Instructions:  After Visit Summary (AVS):   Faxed to:  03/09/14 Discharge Summary Note:   Faxed to:  03/09/14 Psychiatric Admission Assessment Note:   Faxed to:  03/09/14 Suicide Risk Assessment - Discharge Assessment:   Faxed to:  03/09/14 Faxed/Sent to the Next Level Care provider:  03/09/14 Faxed to [facility] @ [phone] Faxed to [facility] @ [phone] Faxed to [facility] @ [phone]  Jerelene ReddenSheena E Cadott, 03/09/2014, 10:52 AM

## 2014-03-24 ENCOUNTER — Emergency Department (HOSPITAL_COMMUNITY): Payer: Self-pay

## 2014-03-24 ENCOUNTER — Emergency Department (HOSPITAL_COMMUNITY): Payer: MEDICAID

## 2014-03-24 ENCOUNTER — Inpatient Hospital Stay (HOSPITAL_COMMUNITY)
Admission: EM | Admit: 2014-03-24 | Discharge: 2014-03-30 | DRG: 897 | Disposition: A | Discharge status: Home / Self Care | Payer: Self-pay | Attending: Internal Medicine | Admitting: Internal Medicine

## 2014-03-24 ENCOUNTER — Ambulatory Visit (HOSPITAL_COMMUNITY)
Admission: RE | Admit: 2014-03-24 | Discharge: 2014-03-24 | Disposition: A | Discharge status: Home / Self Care | Payer: 59 | Attending: Psychiatry | Admitting: Psychiatry

## 2014-03-24 ENCOUNTER — Encounter (HOSPITAL_COMMUNITY): Payer: Self-pay | Admitting: Emergency Medicine

## 2014-03-24 ENCOUNTER — Inpatient Hospital Stay (HOSPITAL_COMMUNITY): Admission: AD | Admit: 2014-03-24 | Payer: 59 | Source: Intra-hospital | Admitting: Psychiatry

## 2014-03-24 DIAGNOSIS — M1990 Unspecified osteoarthritis, unspecified site: Secondary | ICD-10-CM | POA: Diagnosis present

## 2014-03-24 DIAGNOSIS — F1210 Cannabis abuse, uncomplicated: Secondary | ICD-10-CM | POA: Diagnosis present

## 2014-03-24 DIAGNOSIS — G4089 Other seizures: Secondary | ICD-10-CM | POA: Diagnosis present

## 2014-03-24 DIAGNOSIS — F39 Unspecified mood [affective] disorder: Secondary | ICD-10-CM | POA: Insufficient documentation

## 2014-03-24 DIAGNOSIS — Z6379 Other stressful life events affecting family and household: Secondary | ICD-10-CM | POA: Insufficient documentation

## 2014-03-24 DIAGNOSIS — F1520 Other stimulant dependence, uncomplicated: Secondary | ICD-10-CM | POA: Insufficient documentation

## 2014-03-24 DIAGNOSIS — K219 Gastro-esophageal reflux disease without esophagitis: Secondary | ICD-10-CM | POA: Insufficient documentation

## 2014-03-24 DIAGNOSIS — Z7151 Drug abuse counseling and surveillance of drug abuser: Secondary | ICD-10-CM | POA: Diagnosis present

## 2014-03-24 DIAGNOSIS — Z888 Allergy status to other drugs, medicaments and biological substances status: Secondary | ICD-10-CM | POA: Insufficient documentation

## 2014-03-24 DIAGNOSIS — F1410 Cocaine abuse, uncomplicated: Secondary | ICD-10-CM | POA: Diagnosis present

## 2014-03-24 DIAGNOSIS — G8929 Other chronic pain: Secondary | ICD-10-CM | POA: Insufficient documentation

## 2014-03-24 DIAGNOSIS — F19230 Other psychoactive substance dependence with withdrawal, uncomplicated: Secondary | ICD-10-CM

## 2014-03-24 DIAGNOSIS — F430 Acute stress reaction: Secondary | ICD-10-CM | POA: Insufficient documentation

## 2014-03-24 DIAGNOSIS — F1120 Opioid dependence, uncomplicated: Secondary | ICD-10-CM | POA: Insufficient documentation

## 2014-03-24 DIAGNOSIS — R45851 Suicidal ideations: Secondary | ICD-10-CM

## 2014-03-24 DIAGNOSIS — F32A Depression, unspecified: Secondary | ICD-10-CM | POA: Diagnosis present

## 2014-03-24 DIAGNOSIS — F19239 Other psychoactive substance dependence with withdrawal, unspecified: Secondary | ICD-10-CM | POA: Diagnosis present

## 2014-03-24 DIAGNOSIS — F329 Major depressive disorder, single episode, unspecified: Secondary | ICD-10-CM | POA: Insufficient documentation

## 2014-03-24 DIAGNOSIS — F419 Anxiety disorder, unspecified: Secondary | ICD-10-CM | POA: Insufficient documentation

## 2014-03-24 DIAGNOSIS — N760 Acute vaginitis: Secondary | ICD-10-CM | POA: Diagnosis present

## 2014-03-24 DIAGNOSIS — T424X2A Poisoning by benzodiazepines, intentional self-harm, initial encounter: Secondary | ICD-10-CM | POA: Diagnosis present

## 2014-03-24 DIAGNOSIS — F1020 Alcohol dependence, uncomplicated: Secondary | ICD-10-CM | POA: Insufficient documentation

## 2014-03-24 DIAGNOSIS — N72 Inflammatory disease of cervix uteri: Secondary | ICD-10-CM | POA: Diagnosis present

## 2014-03-24 DIAGNOSIS — R102 Pelvic and perineal pain: Secondary | ICD-10-CM

## 2014-03-24 DIAGNOSIS — A499 Bacterial infection, unspecified: Secondary | ICD-10-CM

## 2014-03-24 DIAGNOSIS — B1920 Unspecified viral hepatitis C without hepatic coma: Secondary | ICD-10-CM | POA: Diagnosis present

## 2014-03-24 DIAGNOSIS — F1220 Cannabis dependence, uncomplicated: Secondary | ICD-10-CM | POA: Insufficient documentation

## 2014-03-24 DIAGNOSIS — R569 Unspecified convulsions: Secondary | ICD-10-CM | POA: Insufficient documentation

## 2014-03-24 DIAGNOSIS — N898 Other specified noninflammatory disorders of vagina: Secondary | ICD-10-CM

## 2014-03-24 DIAGNOSIS — Z818 Family history of other mental and behavioral disorders: Secondary | ICD-10-CM | POA: Insufficient documentation

## 2014-03-24 DIAGNOSIS — F22 Delusional disorders: Secondary | ICD-10-CM | POA: Diagnosis present

## 2014-03-24 DIAGNOSIS — F17210 Nicotine dependence, cigarettes, uncomplicated: Secondary | ICD-10-CM | POA: Diagnosis present

## 2014-03-24 DIAGNOSIS — F17200 Nicotine dependence, unspecified, uncomplicated: Secondary | ICD-10-CM | POA: Insufficient documentation

## 2014-03-24 DIAGNOSIS — Z599 Problem related to housing and economic circumstances, unspecified: Secondary | ICD-10-CM

## 2014-03-24 DIAGNOSIS — B9689 Other specified bacterial agents as the cause of diseases classified elsewhere: Secondary | ICD-10-CM

## 2014-03-24 DIAGNOSIS — F1910 Other psychoactive substance abuse, uncomplicated: Secondary | ICD-10-CM | POA: Insufficient documentation

## 2014-03-24 DIAGNOSIS — F13239 Sedative, hypnotic or anxiolytic dependence with withdrawal, unspecified: Principal | ICD-10-CM | POA: Diagnosis present

## 2014-03-24 DIAGNOSIS — F319 Bipolar disorder, unspecified: Secondary | ICD-10-CM | POA: Diagnosis present

## 2014-03-24 DIAGNOSIS — Z79891 Long term (current) use of opiate analgesic: Secondary | ICD-10-CM | POA: Insufficient documentation

## 2014-03-24 DIAGNOSIS — B182 Chronic viral hepatitis C: Secondary | ICD-10-CM | POA: Diagnosis present

## 2014-03-24 DIAGNOSIS — F13939 Sedative, hypnotic or anxiolytic use, unspecified with withdrawal, unspecified: Secondary | ICD-10-CM | POA: Diagnosis present

## 2014-03-24 DIAGNOSIS — Z881 Allergy status to other antibiotic agents status: Secondary | ICD-10-CM

## 2014-03-24 LAB — GC/CHLAMYDIA PROBE AMP (~~LOC~~) NOT AT ARMC
Chlamydia: NEGATIVE
Neisseria Gonorrhea: NEGATIVE

## 2014-03-24 LAB — CBC WITH DIFFERENTIAL/PLATELET
BASOS ABS: 0 10*3/uL (ref 0.0–0.1)
Basophils Relative: 0 % (ref 0–1)
EOS ABS: 0.1 10*3/uL (ref 0.0–0.7)
Eosinophils Relative: 1 % (ref 0–5)
HCT: 32.7 % — ABNORMAL LOW (ref 36.0–46.0)
HEMOGLOBIN: 11.7 g/dL — AB (ref 12.0–15.0)
LYMPHS PCT: 24 % (ref 12–46)
Lymphs Abs: 1.4 10*3/uL (ref 0.7–4.0)
MCH: 30.2 pg (ref 26.0–34.0)
MCHC: 35.8 g/dL (ref 30.0–36.0)
MCV: 84.3 fL (ref 78.0–100.0)
Monocytes Absolute: 0.3 10*3/uL (ref 0.1–1.0)
Monocytes Relative: 5 % (ref 3–12)
NEUTROS PCT: 70 % (ref 43–77)
Neutro Abs: 4.1 10*3/uL (ref 1.7–7.7)
Platelets: 221 10*3/uL (ref 150–400)
RBC: 3.88 MIL/uL (ref 3.87–5.11)
RDW: 12.6 % (ref 11.5–15.5)
WBC: 5.8 10*3/uL (ref 4.0–10.5)

## 2014-03-24 LAB — URINALYSIS, ROUTINE W REFLEX MICROSCOPIC
Bilirubin Urine: NEGATIVE
Glucose, UA: NEGATIVE mg/dL
HGB URINE DIPSTICK: NEGATIVE
KETONES UR: NEGATIVE mg/dL
Leukocytes, UA: NEGATIVE
Nitrite: NEGATIVE
Protein, ur: NEGATIVE mg/dL
Specific Gravity, Urine: 1.025 (ref 1.005–1.030)
Urobilinogen, UA: 0.2 mg/dL (ref 0.0–1.0)
pH: 5.5 (ref 5.0–8.0)

## 2014-03-24 LAB — RAPID URINE DRUG SCREEN, HOSP PERFORMED
AMPHETAMINES: NOT DETECTED
BENZODIAZEPINES: POSITIVE — AB
Barbiturates: NOT DETECTED
COCAINE: POSITIVE — AB
Opiates: NOT DETECTED
TETRAHYDROCANNABINOL: POSITIVE — AB

## 2014-03-24 LAB — COMPREHENSIVE METABOLIC PANEL
ALBUMIN: 4 g/dL (ref 3.5–5.2)
ALT: 27 U/L (ref 0–35)
ANION GAP: 8 (ref 5–15)
AST: 28 U/L (ref 0–37)
Alkaline Phosphatase: 54 U/L (ref 39–117)
BILIRUBIN TOTAL: 0.4 mg/dL (ref 0.3–1.2)
BUN: 15 mg/dL (ref 6–23)
CALCIUM: 9 mg/dL (ref 8.4–10.5)
CO2: 23 mmol/L (ref 19–32)
CREATININE: 0.65 mg/dL (ref 0.50–1.10)
Chloride: 106 mEq/L (ref 96–112)
GFR calc Af Amer: >90 mL/min (ref >90)
Glucose, Bld: 88 mg/dL (ref 70–99)
POTASSIUM: 3.5 mmol/L (ref 3.5–5.1)
SODIUM: 137 mmol/L (ref 135–145)
TOTAL PROTEIN: 7.3 g/dL (ref 6.0–8.3)

## 2014-03-24 LAB — POC URINE PREG, ED: PREG TEST UR: NEGATIVE

## 2014-03-24 LAB — ACETAMINOPHEN LEVEL: Acetaminophen (Tylenol), Serum: <10 ug/mL — ABNORMAL LOW (ref 10–30)

## 2014-03-24 LAB — SALICYLATE LEVEL: Salicylate Lvl: <4 mg/dL (ref 2.8–20.0)

## 2014-03-24 LAB — ETHANOL: Alcohol, Ethyl (B): <5 mg/dL (ref 0–9)

## 2014-03-24 LAB — WET PREP, GENITAL
Trich, Wet Prep: NONE SEEN
YEAST WET PREP: NONE SEEN

## 2014-03-24 LAB — CBG MONITORING, ED: Glucose-Capillary: 106 mg/dL — ABNORMAL HIGH (ref 70–99)

## 2014-03-24 MED ORDER — AZITHROMYCIN 250 MG PO TABS
1000.0000 mg | ORAL_TABLET | Freq: Once | ORAL | Status: AC
  Administered 2014-03-24: 1000 mg via ORAL
  Filled 2014-03-24: qty 4

## 2014-03-24 MED ORDER — METHADONE HCL 10 MG PO TABS
40.0000 mg | ORAL_TABLET | Freq: Every day | ORAL | Status: AC
  Administered 2014-03-25 – 2014-03-27 (×3): 40 mg via ORAL
  Filled 2014-03-24: qty 4
  Filled 2014-03-24 (×2): qty 8

## 2014-03-24 MED ORDER — ALPRAZOLAM 0.5 MG PO TABS: 3.0000 mg | ORAL_TABLET | Freq: Three times a day (TID) | ORAL | Status: DC

## 2014-03-24 MED ORDER — METRONIDAZOLE 500 MG PO TABS
2000.0000 mg | ORAL_TABLET | Freq: Once | ORAL | Status: AC
  Administered 2014-03-24: 2000 mg via ORAL
  Filled 2014-03-24: qty 4

## 2014-03-24 MED ORDER — CELECOXIB 200 MG PO CAPS
200.0000 mg | ORAL_CAPSULE | Freq: Two times a day (BID) | ORAL | Status: DC
  Administered 2014-03-24 – 2014-03-30 (×12): 200 mg via ORAL
  Filled 2014-03-24 (×15): qty 1

## 2014-03-24 MED ORDER — ALPRAZOLAM 0.5 MG PO TABS
3.0000 mg | ORAL_TABLET | Freq: Three times a day (TID) | ORAL | Status: DC
  Administered 2014-03-25 (×2): 3 mg via ORAL
  Filled 2014-03-24 (×3): qty 6

## 2014-03-24 MED ORDER — GABAPENTIN 300 MG PO CAPS
600.0000 mg | ORAL_CAPSULE | Freq: Four times a day (QID) | ORAL | Status: DC
  Administered 2014-03-24 – 2014-03-30 (×23): 600 mg via ORAL
  Filled 2014-03-24 (×26): qty 2

## 2014-03-24 MED ORDER — METRONIDAZOLE 500 MG PO TABS
500.0000 mg | ORAL_TABLET | Freq: Two times a day (BID) | ORAL | Status: DC
  Administered 2014-03-25 – 2014-03-30 (×11): 500 mg via ORAL
  Filled 2014-03-24 (×12): qty 1

## 2014-03-24 MED ORDER — MIRTAZAPINE 7.5 MG PO TABS
7.5000 mg | ORAL_TABLET | Freq: Every day | ORAL | Status: DC
  Administered 2014-03-24 – 2014-03-28 (×5): 7.5 mg via ORAL
  Filled 2014-03-24 (×8): qty 1

## 2014-03-24 MED ORDER — ALUM & MAG HYDROXIDE-SIMETH 200-200-20 MG/5ML PO SUSP
30.0000 mL | ORAL | Status: DC | PRN
Start: 2014-03-24 — End: 2014-03-30

## 2014-03-24 MED ORDER — MECLIZINE HCL 25 MG PO TABS
25.0000 mg | ORAL_TABLET | Freq: Three times a day (TID) | ORAL | Status: DC | PRN
  Administered 2014-03-26 – 2014-03-29 (×2): 25 mg via ORAL
  Filled 2014-03-24 (×3): qty 1

## 2014-03-24 MED ORDER — LIDOCAINE HCL (PF) 1 % IJ SOLN
5.0000 mL | Freq: Once | INTRAMUSCULAR | Status: AC
  Administered 2014-03-24: 5 mL
  Filled 2014-03-24: qty 5

## 2014-03-24 MED ORDER — IBUPROFEN 400 MG PO TABS
400.0000 mg | ORAL_TABLET | Freq: Once | ORAL | Status: AC
  Administered 2014-03-24: 400 mg via ORAL
  Filled 2014-03-24: qty 1

## 2014-03-24 MED ORDER — IBUPROFEN 600 MG PO TABS
600.0000 mg | ORAL_TABLET | Freq: Three times a day (TID) | ORAL | Status: DC | PRN
  Administered 2014-03-25 – 2014-03-30 (×3): 600 mg via ORAL
  Filled 2014-03-24 (×5): qty 1

## 2014-03-24 MED ORDER — CLONIDINE HCL 0.1 MG/24HR TD PTWK
0.1000 mg | MEDICATED_PATCH | TRANSDERMAL | Status: DC
  Administered 2014-03-26: 0.1 mg via TRANSDERMAL
  Filled 2014-03-24: qty 1

## 2014-03-24 MED ORDER — LORAZEPAM 1 MG PO TABS
1.0000 mg | ORAL_TABLET | Freq: Three times a day (TID) | ORAL | Status: DC | PRN
  Administered 2014-03-25 – 2014-03-30 (×12): 1 mg via ORAL
  Filled 2014-03-24 (×3): qty 1
  Filled 2014-03-24 (×2): qty 2
  Filled 2014-03-24 (×5): qty 1
  Filled 2014-03-24: qty 2
  Filled 2014-03-24 (×3): qty 1

## 2014-03-24 MED ORDER — LAMOTRIGINE 25 MG PO TABS
25.0000 mg | ORAL_TABLET | Freq: Every day | ORAL | Status: DC
  Administered 2014-03-24 – 2014-03-29 (×6): 25 mg via ORAL
  Filled 2014-03-24 (×6): qty 1

## 2014-03-24 MED ORDER — LORAZEPAM 2 MG/ML IJ SOLN
INTRAMUSCULAR | Status: AC
  Administered 2014-03-24: 1 mg
  Filled 2014-03-24: qty 1

## 2014-03-24 MED ORDER — NICOTINE 21 MG/24HR TD PT24
21.0000 mg | MEDICATED_PATCH | Freq: Every day | TRANSDERMAL | Status: DC
  Administered 2014-03-24 – 2014-03-30 (×7): 21 mg via TRANSDERMAL
  Filled 2014-03-24 (×7): qty 1

## 2014-03-24 MED ORDER — HYDROXYZINE HCL 25 MG PO TABS
25.0000 mg | ORAL_TABLET | Freq: Four times a day (QID) | ORAL | Status: DC | PRN
  Administered 2014-03-25 – 2014-03-30 (×6): 25 mg via ORAL
  Filled 2014-03-24 (×6): qty 1

## 2014-03-24 MED ORDER — OLANZAPINE 7.5 MG PO TABS
15.0000 mg | ORAL_TABLET | Freq: Every day | ORAL | Status: DC
  Administered 2014-03-24 – 2014-03-29 (×6): 15 mg via ORAL
  Filled 2014-03-24 (×3): qty 2
  Filled 2014-03-24: qty 3
  Filled 2014-03-24 (×3): qty 2

## 2014-03-24 MED ORDER — ALPRAZOLAM 0.5 MG PO TABS
3.0000 mg | ORAL_TABLET | Freq: Once | ORAL | Status: AC
  Administered 2014-03-24: 3 mg via ORAL
  Filled 2014-03-24: qty 6

## 2014-03-24 MED ORDER — ONDANSETRON HCL 4 MG PO TABS
4.0000 mg | ORAL_TABLET | Freq: Three times a day (TID) | ORAL | Status: DC | PRN
  Administered 2014-03-24 (×2): 4 mg via ORAL
  Filled 2014-03-24 (×3): qty 1

## 2014-03-24 MED ORDER — ZOLPIDEM TARTRATE 5 MG PO TABS
5.0000 mg | ORAL_TABLET | Freq: Every evening | ORAL | Status: DC | PRN
  Administered 2014-03-29: 5 mg via ORAL
  Filled 2014-03-24 (×2): qty 1

## 2014-03-24 MED ORDER — CEFTRIAXONE SODIUM 250 MG IJ SOLR
250.0000 mg | Freq: Once | INTRAMUSCULAR | Status: AC
  Administered 2014-03-24: 250 mg via INTRAMUSCULAR
  Filled 2014-03-24: qty 250

## 2014-03-24 MED ORDER — ALPRAZOLAM 0.25 MG PO TABS
0.5000 mg | ORAL_TABLET | Freq: Once | ORAL | Status: AC
  Administered 2014-03-24: 0.5 mg via ORAL
  Filled 2014-03-24: qty 2

## 2014-03-24 MED ORDER — ACETAMINOPHEN 325 MG PO TABS
650.0000 mg | ORAL_TABLET | ORAL | Status: DC | PRN
  Administered 2014-03-29 – 2014-03-30 (×2): 650 mg via ORAL
  Filled 2014-03-24 (×3): qty 2

## 2014-03-24 NOTE — ED Notes (Signed)
This RN heard sitter call out for help. Upon entering room sitter states that pt had a seizure. Pt was noted to have a right sided gaze and pupils approx 6mm and equal. Pt then began to seize again lasting less than 1 minute. Dr Felix PaciniJacobuwitz was notified IV was started. Pt then seized once again lasting less than 30 seconds. Pt was given 1mg  of ativan per verbal from Dr Shela CommonsJ by Maralyn Sagosarah slack rn. Pt was moved to pod d for further monitoring. Marylene LandAngela RN given report on events. Sitter remains at bedside with pt. Pt on monitor.

## 2014-03-24 NOTE — ED Notes (Signed)
Patient transported to Ultrasound 

## 2014-03-24 NOTE — BH Assessment (Addendum)
Tele Assessment Note   [name] is an 29 y.o. female who came to [facility] c/o SI with a plan to overdose on pills, xanax withdrawal (takes 12 per day). Pt says he has 12 previous suicide attempts.  Pt says she is feeling anxious and her legs are hurting from withdrawals.  Pt says he was just d/c 'd from [facility] 3 weeks ago. She says he lives with an elderly man who lets her live there since she helps him out. She does not have a job other than that.  Pt says she is depressed because DSS had custody her 27 y/o daughter and she just gave her up for adoption to her daughter's teacher. She says she also just broke up with her BF, and has no supports.    She wants to detox off of methadone, and says he is down to 65 mg per day. She has been abusing Xanax and uses 12 daily, and last used yesterday. Pt also says she usually does not drink or use cocaine, but used both last night.  Pt says she sees Dr. Katrinka Blazing on an OP basis, and says she has recently been approved for an ACT team (SRQ).  Pt denies A/V hallucinations and there is no evidence that pt is responding to internal stimuli. She was very anxious and tearful during assessment and had a a casual appearance with red, puffy eyes.  Her thought content was normal, pt was oriented to person, place, situation, but thought it was Wednesday.  Melissa Caprice, NP, recommends IP treatment. Pt transferred to [facility] for medical clearance and placement since [facility] has no beds at this time.  TTS will seek placement.  Axis I: Anxiety Disorder NOS, Mood Disorder NOS and Substance Abuse Axis II: Deferred Axis III:  Past Medical History  Diagnosis Date  . Drug abuse   . GERD (gastroesophageal reflux disease)   . Depression   . Arthritis   . Seizures   . Chronic pain   . Methadone maintenance therapy patient   . Anxiety    Axis IV: economic problems, housing problems, other psychosocial or environmental problems, problems related to legal system/crime, problems related to  social environment and problems with primary support group Axis V: 31-40 impairment in reality testing  Past Medical History:  Past Medical History  Diagnosis Date  . Drug abuse   . GERD (gastroesophageal reflux disease)   . Depression   . Arthritis   . Seizures   . Chronic pain   . Methadone maintenance therapy patient   . Anxiety     Past Surgical History  Procedure Laterality Date  . Appendectomy    . Cesarean section    . Mandible fracture surgery      Family History:  Family History  Problem Relation Age of Onset  . Mental illness Mother   . Mental illness Brother   . Diabetes Maternal Grandmother   . Mental illness Maternal Grandmother   . Hypertension Maternal Grandfather   . Diabetes Paternal Grandmother   . Heart disease Paternal Grandmother     Social History:  reports that she has been smoking.  She has never used smokeless tobacco. She reports that she drinks alcohol. She reports that she uses illicit drugs (Heroin, Marijuana, and Benzodiazepines).  Additional Social History:  Alcohol / Drug Use Pain Medications: does not abuse Prescriptions: abuses xanax Over the Counter: none History of alcohol / drug use?: Yes Longest period of sobriety (when/how long): unknown Negative Consequences of Use:  Personal relationships, Work / [employer] Withdrawal Symptoms: Agitation (legs hurting) Substance #1 Name of Substance 1: Benzo's 1 - Age of First Use: 29 yrs old  1 - Amount (size/oz): 12 1 - Frequency: daily for 3 weeks 1 - Duration: ongoing  1 - Last Use / Amount: yesterday Substance #2 Name of Substance 2: Methodone  (wants to detox off of this) 2 - Age of First Use: "It's been yrs" 2 - Amount (size/oz): "I started at 230mg 's and now I am down to 65mg 's" 2 - Frequency: daily  2 - Duration: on-going  2 - Last Use / Amount: 02/28/2014 Substance #3 Name of Substance 3: THC 3 - Age of First Use: 29 yrs old  3 - Amount (size/oz): varies  3 - Frequency: 1x  per week  3 - Duration: on-going  3 - Last Use / Amount: 2 days ago   CIWA:   COWS:    PATIENT STRENGTHS: (choose at least two) Capable of independent living Communication skills Work skills  Allergies:  Allergies  Allergen Reactions  . Narcan [Naloxone] Other (See Comments) and Itching    Violent withdrawal symptoms; interferes with methadone Interferes with methadone and causes withdrawal   . Suboxone [Buprenorphine Hcl-Naloxone Hcl] Other (See Comments) and Itching    Violent withdrawal symptoms; interferes with methadone Interferes with methadone and causes withdrawal     Home Medications:  (Not in a hospital admission)  OB/GYN Status:  Patient's last menstrual period was 02/13/2014 (exact date).  General Assessment Data Location of Assessment: [facility] Is this a Tele or Face-to-Face Assessment?: Face-to-Face Is this an Initial Assessment or a Re-assessment for this encounter?: Initial Assessment Living Arrangements: Other relatives Can pt return to current living arrangement?: Yes Admission Status: Voluntary Is patient capable of signing voluntary admission?: Yes Transfer from: Home Referral Source: Self/Family/Friend  Medical Screening Exam [facility] Walk-in ONLY) Medical Exam completed: No Reason for MSE not completed: Other: [facility]  [facility] Living Arrangements: Other relatives Name of Psychiatrist: Katrinka BlazingSmith Name of Therapist:  (justgot approved for ACT team SRQ?)  Education Status Is patient currently in school?: No  Risk to self with the past 6 months Suicidal Ideation: Yes-Currently Present Suicidal Intent: Yes-Currently Present Is patient at risk for suicide?: Yes Suicidal Plan?: Yes-Currently Present Specify Current Suicidal Plan: overdose on pills Access to Means: Yes Specify Access to Suicidal Means: environment What has been your use of drugs/alcohol within the last 12 months?:  (see SA  section) Previous Attempts/Gestures: Yes How many times?: 12 Other Self Harm Risks:  (SA) Triggers for Past Attempts: Unknown Intentional Self Injurious Behavior: None Family Suicide History: Unknown Recent stressful life event(s): Loss (Comment), Financial Problems, Legal Issues (DSS has custody of daughter, pt says she gave her up for ado) Persecutory voices/beliefs?: No Depression: Yes Depression Symptoms: Despondent, Insomnia, Tearfulness, Isolating, Fatigue, Guilt, Loss of interest in usual pleasures, Feeling worthless/self pity, Feeling angry/irritable Substance abuse history and/or treatment for substance abuse?: Yes Suicide prevention information given to non-admitted patients: Not applicable  Risk to Others within the past 6 months Homicidal Ideation: No Thoughts of Harm to Others: No Current Homicidal Intent: No Current Homicidal Plan: No Access to Homicidal Means: No History of harm to others?: No Assessment of Violence: None Noted Does patient have access to weapons?: No Criminal Charges Pending?: Yes Describe Pending Criminal Charges: DUI Does patient have a court date: Yes Court Date:  (in February)  Psychosis Hallucinations: None  noted Delusions: None noted  Mental Status Report Appear/Hygiene: Unremarkable Eye Contact: Good Motor Activity: Unremarkable Speech: Logical/coherent Level of Consciousness: Alert Mood: Depressed, Sad, Anxious Affect: Anxious Anxiety Level: Severe Thought Processes: Coherent, Relevant Judgement: Partial Orientation: Person, Place, Situation (thought it was Wednesday) Obsessive Compulsive Thoughts/Behaviors: None  Cognitive Functioning Concentration: Decreased Memory: Recent Impaired, Remote Intact IQ: Average Insight: Poor Impulse Control: Poor Appetite: Poor Weight Loss: 10 Weight Gain: 0 Sleep: Decreased Total Hours of Sleep: 4 Vegetative Symptoms: None  ADLScreening [facility]) Patient's cognitive  ability adequate to safely complete daily activities?: Yes Patient able to express need for assistance with ADLs?: Yes Independently performs ADLs?: Yes (appropriate for developmental age)  Prior Inpatient Therapy Prior Inpatient Therapy: Yes Prior Therapy Dates: 6 times this year Prior Therapy Facilty/Provider(s): [facility] [place] Reason for Treatment:  depression, SA  Prior Outpatient Therapy Prior Outpatient Therapy: Yes Prior Therapy Dates: ongoing Prior Therapy Facilty/Provider(s): Dr. Katrinka Blazing Reason for Treatment: SA, depression  ADL Screening (condition at time of admission) Patient's cognitive ability adequate to safely complete daily activities?: Yes Patient able to express need for assistance with ADLs?: Yes Independently performs ADLs?: Yes (appropriate for developmental age)                  Additional Information 1:1 In Past 12 Months?: No CIRT Risk: No Elopement Risk: No Does patient have medical clearance?: No     Disposition:  Disposition Initial Assessment Completed for this Encounter: Yes Disposition of Patient: Inpatient treatment program Type of inpatient treatment program: Adult  [facility] 03/24/2014 10:18 AM

## 2014-03-24 NOTE — H&P (Signed)
[facility] Admission History and Physical       [name] WJX:[id number] DOB: Dec 08, 1985 DOA: 03/24/2014  Referring physician: EDP PCP: No primary care provider on file.  Specialists:   Chief Complaint: Seizure   HPI: [name] is a 29 y.o. female with a history of Polysubstance Abuse who presented to the ED with complaints of wanting detox treatment and having suicidal thoughts and having a plan.   She also reported havin auditory hallucinations.   She reports having increased anxiety and stressors lately and she no longer had Medicaid and was not able to get her prescribed Xanax so she has been getting Xanax off the streets and reports that she has been taking Xanax 1 mg tabs 12 times a day for 3 weeks.g   She also [facility] to the [facility] and is now down to a dose of 40 mg of Methadone daily.    She reports that she wants to kill herself because she lost custody of her daughter, and her daughter has now been adopted.    She was seen by the EDP and by [facility] in the ED, and was awaiting placement at the [facility], but suffered a tonic -clonics seizure witnessed by staff in the ED that lasted 1 minute.    She was medicated with IV Ativan  x 1 dose.    She was then referred for medical admission.    Her UDS was positive for Benzodiazepines, Cocaine and THC.   She had a similar hospitalization for Withdrawal Seizures in 08/2013.     She also had complaints of a vaginal discharge and lower ABD pain and was evaluated by the EDP and a Vaginal [place] was performed and was negative for Acute fndings.  She was treated empirically with IV Rocephin, Oral Azithromycin.        Review of Systems:  Constitutional: No Weight Loss, No Weight Gain, Night Sweats, Fevers, Chills, Dizziness, Fatigue, or Generalized Weakness HEENT: No Headaches, Difficulty Swallowing,Tooth/Dental Problems,Sore Throat,  No Sneezing, Rhinitis, Ear Ache, Nasal Congestion, or Post Nasal Drip,  Cardio-vascular:  No Chest  pain, Orthopnea, PND, Edema in Lower Extremities, Anasarca, Dizziness, Palpitations  Resp: No Dyspnea, No DOE, No Productive Cough, No Non-Productive Cough, No Hemoptysis, No Wheezing.    GI: No Heartburn, Indigestion, Abdominal Pain, Nausea, Vomiting, Diarrhea, Hematemesis, Hematochezia, Melena, Change in Bowel Habits,  Loss of Appetite  GU: No Dysuria, Change in Color of Urine, No Urgency or Frequency, No Flank pain, +Vaginal discharge Musculoskeletal: No Joint Pain or Swelling, No Decreased Range of Motion, No Back Pain.  Neurologic: No Syncope, +Seizures, Muscle Weakness, Paresthesia, Vision Disturbance or Loss, No Diplopia, No Vertigo, No Difficulty Walking,  Skin: No Rash or Lesions. Psych: No Change in Mood or Affect, +Depression, +Anxiety, No Memory loss, No Confusion, or + Auditory Hallucinations   Past Medical History  Diagnosis Date  . Drug abuse   . GERD (gastroesophageal reflux disease)   . Depression   . Arthritis   . Seizures   . Chronic pain   . Methadone maintenance therapy patient   . Anxiety       Past Surgical History  Procedure Laterality Date  . Appendectomy    . Cesarean section    . Mandible fracture surgery         Prior to Admission medications   Medication Sig Start Date End Date Taking? Authorizing Provider  ALPRAZolam Prudy Feeler) 0.5 MG tablet Take 0.5 mg by mouth at bedtime as needed  for anxiety.   Yes Historical Provider, MD  celecoxib (CELEBREX) 200 MG capsule Take 1 capsule (200 mg total) by mouth 2 (two) times daily. 03/07/14  Yes Velna Hatchet May Agustin, NP  gabapentin (NEURONTIN) 300 MG capsule Take 2 capsules (600 mg total) by mouth 4 (four) times daily. 03/07/14  Yes Velna Hatchet May Agustin, NP  hydrOXYzine (ATARAX/VISTARIL) 25 MG tablet Take 1 tablet (25 mg total) by mouth every 6 (six) hours as needed for anxiety. 03/07/14  Yes Velna Hatchet May Agustin, NP  lamoTRIgine (LAMICTAL) 25 MG tablet Take 1 tablet (25 mg total) by mouth daily. 03/07/14  Yes Velna Hatchet  May Agustin, NP  meclizine (ANTIVERT) 25 MG tablet Take 1 tablet (25 mg total) by mouth 3 (three) times daily as needed for dizziness or nausea. 03/07/14  Yes Velna Hatchet May Agustin, NP  mirtazapine (REMERON) 7.5 MG tablet Take 1 tablet (7.5 mg total) by mouth at bedtime. 03/07/14  Yes Velna Hatchet May Agustin, NP  OLANZapine (ZYPREXA) 15 MG tablet Take 1 tablet (15 mg total) by mouth at bedtime. 03/07/14  Yes Velna Hatchet May Agustin, NP  cloNIDine (CATAPRES - DOSED IN MG/24 HR) 0.1 mg/24hr patch Place 1 patch (0.1 mg total) onto the skin once a week. 03/07/14   Velna Hatchet May Agustin, NP  nicotine (NICODERM CQ - DOSED IN MG/24 HOURS) 21 mg/24hr patch Place 1 patch (21 mg total) onto the skin daily at 6 (six) AM. 03/07/14   Velna Hatchet May Agustin, NP  polyethylene glycol [facility] Ethelene Hal) packet Take 17 g by mouth 2 (two) times daily. Hold if you develop diarrhea Patient not taking: Reported on 03/24/2014 03/07/14   Velna Hatchet May Agustin, NP  senna-docusate (SENOKOT-S) 8.6-50 MG per tablet Take 1 tablet by mouth at bedtime. Hold if you develop diarrhea Patient not taking: Reported on 03/24/2014 03/07/14   Lindwood Qua, NP      Allergies  Allergen Reactions  . Narcan [Naloxone] Other (See Comments) and Itching    Violent withdrawal symptoms; interferes with methadone Interferes with methadone and causes withdrawal   . Suboxone [Buprenorphine Hcl-Naloxone Hcl] Other (See Comments) and Itching    Violent withdrawal symptoms; interferes with methadone Interferes with methadone and causes withdrawal      Social History:  reports that she has been smoking.  She has never used smokeless tobacco. She reports that she drinks alcohol. She reports that she uses illicit drugs (Heroin, Marijuana, and Benzodiazepines).     Family History  Problem Relation Age of Onset  . Mental illness Mother   . Mental illness Brother   . Diabetes Maternal Grandmother   . Mental illness Maternal Grandmother   . Hypertension  Maternal Grandfather   . Diabetes Paternal Grandmother   . Heart disease Paternal Grandmother        Physical Exam:  GEN:  Well Nourished and Well developed  29 y.o. Caucasian female examined  and in no acute distress; cooperative with exam Filed Vitals:   03/24/14 1900 03/24/14 1945 03/24/14 2015 03/24/14 2045  BP: 99/55 94/70 93/53  90/44  Pulse: 90 88  92  Temp:      TempSrc:      Resp: 13 21 23 16   SpO2: 98% 98% 97% 99%   Blood pressure 90/44, pulse 92, temperature 98.9 F (37.2 C), temperature source Oral, resp. rate 16, last menstrual period 02/13/2014, SpO2 99 %. PSYCH: She is alert and oriented x4; does not appear anxious does not appear depressed; affect is normal HEENT: Normocephalic and Atraumatic, Mucous membranes pink;  PERRLA; EOM intact; Fundi:  Benign;  No scleral icterus, Nares: Patent, Oropharynx: Clear, Fair Dentition,    Neck:  FROM, No Cervical Lymphadenopathy nor Thyromegaly or Carotid Bruit; No JVD; Breasts:: Not examined CHEST WALL: No tenderness CHEST: Normal respiration, clear to auscultation bilaterally HEART: Regular rate and rhythm; no murmurs rubs or gallops BACK: No kyphosis or scoliosis; No CVA tenderness ABDOMEN: Positive Bowel Sounds, Soft Non-Tender; No Masses, No Organomegaly, Rectal Exam: Not done EXTREMITIES: No Cyanosis, Clubbing, or Edema; No Ulcerations. Genitalia: not examined PULSES: 2+ and symmetric SKIN: Normal hydration no rash or ulceration CNS:  Alert and Oriented x 4, No focal Deficits Vascular: pulses palpable throughout    Labs on Admission:  Basic Metabolic Panel:  Recent Labs Lab 03/24/14 1121  NA 137  K 3.5  CL 106  CO2 23  GLUCOSE 88  BUN 15  CREATININE 0.65  CALCIUM 9.0   Liver Function Tests:  Recent Labs Lab 03/24/14 1121  AST 28  ALT 27  ALKPHOS 54  BILITOT 0.4  PROT 7.3  ALBUMIN 4.0   No results for input(s): LIPASE, AMYLASE in the last 168 hours. No results for input(s): AMMONIA in the last  168 hours. CBC:  Recent Labs Lab 03/24/14 1121  WBC 5.8  NEUTROABS 4.1  HGB 11.7*  HCT 32.7*  MCV 84.3  PLT 221   Cardiac Enzymes: No results for input(s): CKTOTAL, CKMB, CKMBINDEX, TROPONINI in the last 168 hours.  BNP (last 3 results) No results for input(s): PROBNP in the last 8760 hours. CBG:  Recent Labs Lab 03/24/14 1833  GLUCAP 106*    Radiological Exams on Admission: Koreas Transvaginal Non-ob  03/24/2014   CLINICAL DATA:  Vaginal discharge, pelvic pain. Concern for pelvic inflammatory disease or tubo-ovarian abscess.  EXAM: TRANSABDOMINAL AND TRANSVAGINAL ULTRASOUND OF PELVIS  DOPPLER ULTRASOUND OF OVARIES  TECHNIQUE: Both transabdominal and transvaginal ultrasound examinations of the pelvis were performed. Transabdominal technique was performed for global imaging of the pelvis including uterus, ovaries, adnexal regions, and pelvic cul-de-sac.  It was necessary to proceed with endovaginal exam following the transabdominal exam to visualize the uterus and ovaries. Color and duplex Doppler ultrasound was utilized to evaluate blood flow to the ovaries.  COMPARISON:  CT 05/05/2006  FINDINGS: Uterus  Measurements: Normal in size at 10.0 x 5.0 x 6.4 cm.  Endometrium  Thickness: Normal in thickness for premenopausal female at 13 mm.  Right ovary  Measurements: Normal in size at 3.1 x 2.4 x 3.5 cm. Normal color Doppler flow and spectral waveforms.  Left ovary  Measurements: Normal in size at 2.9 x 3.1 x 2.6 cm. There is a dominant follicle measuring 2.2 cm. Normal color Doppler flow and spectral waveforms.  Pulsed Doppler evaluation of both ovaries demonstrates normal low-resistance arterial and venous waveforms.  Other findings  Small volume free fluid .  IMPRESSION: 1. Normal ovaries without evidence of torsion. 2. Normal uterus.   Electronically Signed   By: Genevive BiStewart  Edmunds M.D.   On: 03/24/2014 15:17   [place] Pelvis Complete  03/24/2014   CLINICAL DATA:  Vaginal discharge, pelvic pain.  Concern for pelvic inflammatory disease or tubo-ovarian abscess.  EXAM: TRANSABDOMINAL AND TRANSVAGINAL ULTRASOUND OF PELVIS  DOPPLER ULTRASOUND OF OVARIES  TECHNIQUE: Both transabdominal and transvaginal ultrasound examinations of the pelvis were performed. Transabdominal technique was performed for global imaging of the pelvis including uterus, ovaries, adnexal regions, and pelvic cul-de-sac.  It was necessary to proceed with endovaginal exam following the transabdominal exam to visualize  the uterus and ovaries. Color and duplex Doppler ultrasound was utilized to evaluate blood flow to the ovaries.  COMPARISON:  CT 05/05/2006  FINDINGS: Uterus  Measurements: Normal in size at 10.0 x 5.0 x 6.4 cm.  Endometrium  Thickness: Normal in thickness for premenopausal female at 13 mm.  Right ovary  Measurements: Normal in size at 3.1 x 2.4 x 3.5 cm. Normal color Doppler flow and spectral waveforms.  Left ovary  Measurements: Normal in size at 2.9 x 3.1 x 2.6 cm. There is a dominant follicle measuring 2.2 cm. Normal color Doppler flow and spectral waveforms.  Pulsed Doppler evaluation of both ovaries demonstrates normal low-resistance arterial and venous waveforms.  Other findings  Small volume free fluid .  IMPRESSION: 1. Normal ovaries without evidence of torsion. 2. Normal uterus.   Electronically Signed   By: Genevive Bi M.D.   On: 03/24/2014 15:17   [place] Art/ven Flow Abd Pelv Doppler  03/24/2014   CLINICAL DATA:  Vaginal discharge, pelvic pain. Concern for pelvic inflammatory disease or tubo-ovarian abscess.  EXAM: TRANSABDOMINAL AND TRANSVAGINAL ULTRASOUND OF PELVIS  DOPPLER ULTRASOUND OF OVARIES  TECHNIQUE: Both transabdominal and transvaginal ultrasound examinations of the pelvis were performed. Transabdominal technique was performed for global imaging of the pelvis including uterus, ovaries, adnexal regions, and pelvic cul-de-sac.  It was necessary to proceed with endovaginal exam following the transabdominal  exam to visualize the uterus and ovaries. Color and duplex Doppler ultrasound was utilized to evaluate blood flow to the ovaries.  COMPARISON:  CT 05/05/2006  FINDINGS: Uterus  Measurements: Normal in size at 10.0 x 5.0 x 6.4 cm.  Endometrium  Thickness: Normal in thickness for premenopausal female at 13 mm.  Right ovary  Measurements: Normal in size at 3.1 x 2.4 x 3.5 cm. Normal color Doppler flow and spectral waveforms.  Left ovary  Measurements: Normal in size at 2.9 x 3.1 x 2.6 cm. There is a dominant follicle measuring 2.2 cm. Normal color Doppler flow and spectral waveforms.  Pulsed Doppler evaluation of both ovaries demonstrates normal low-resistance arterial and venous waveforms.  Other findings  Small volume free fluid .  IMPRESSION: 1. Normal ovaries without evidence of torsion. 2. Normal uterus.   Electronically Signed   By: Genevive Bi M.D.   On: 03/24/2014 15:17     EKG: Independently reviewed.    Assessment/Plan:   29 y.o. female with Principal Problem:   1.    Benzodiazepine withdrawal/ Withdrawal seizures   Re-institute Xanax PO at  PO QID per recommendation of Poison control   PRN IV Ativan if unable to take PO   Seizure Precautions  Active Problems:   2.   Suicidal ideation   Suicide Precautions   1:1 Sitter   [facility]/Psych Re-Evaluation in AM     3.    Polysubstance abuse-UDS positive for Benzos, Cocaine, and THC   [facility] evaluation for taper and weaning from medications   Clonidine   PNn IV Ativan        4.    Methadone maintenance therapy patient   Continue Methadone 40 mg po q AM    Goes to a Methadone Clinic     5.    Depression   [facility] evaluation        6.    Hepatitis C   Chronic      7.    DVT Prophylaxis   Lovenox      8.   Presumptive Cervicitis, and Bacterial Vaginosis-  Administered Singel Dose Rocephin, Azithromycin and Metronidazole therapies    Code Status:  FULL CODE     Family Communication:     No Family Present Disposition  Plan:    Inpatient Stepdown Unit     Time spent:  72 Minutes  Ron Parker [facility] Pager [phone]   If 7AM -7PM Please Contact the Day Rounding Team MD for [facility]  If 7PM-7AM, Please Contact Night-Floor Coverage  [email or web address] Password [email or web address] 03/24/2014, 9:14 PM     ADDENDUM:  Patient seen and examined on 03/24/2014

## 2014-03-24 NOTE — ED Provider Notes (Signed)
CSN: [id number]     Arrival date & time 03/24/14  1023 History   First MD Initiated Contact with Patient 03/24/14 1040     Chief Complaint  Patient presents with  . Suicidal     (Consider location/radiation/quality/duration/timing/severity/associated sxs/prior Treatment) HPI Comments: [name] is a 29 y.o. female with a PMHx of substance abuse, depression, anxiety, methadone maintenance therapy, seizures, chronic pain, arthritis, and GERD, who presents to the ED with complaints of suicidal thoughts with a plan of overdose, depression, anxiety, and withdrawal from methadone and benzodiazepines. She states that she sees Dr. Katrinka BlazingSmith for psychiatry, but that she ran out of her psychiatric medications 2 days ago and this has triggered her depression and anxiety to become worse. She states that she is supposed to be taking Zyprexa, Vistaril, Neurontin, and baclofen but has not taken these in 2 days. Reports that she sleeps with strangers to get money to buy drugs, and that she has not had methadone or benzos since yesterday and is now experiencing withdrawal symptoms of jitteriness, headache, and nausea. She states she had been taking 12 Xanax per day of 1 mg tablets. She states that in the past she has been on a tapered benzo treatment and methadone treatment plan, and that that has typically worked to help with her withdrawal.  She also reports that she was given a clonidine patch for her withdrawal symptoms in the past, and has run out of these. She reports that since the loss of her daughter several months ago, she has had worsening depression and anxiety and insomnia. She denies any homicidal thoughts. She endorses some auditory hallucinations, stating that she hears people talking about her. She does also endorse a 10 out of 10 generalized throbbing constant nonradiating headache with no known  aggravating or alleviating factors, and states this is similar to prior headaches she has experienced with  withdrawal. states she consumed 1- 750mL bottle of wine last night, no other IVDU or EtOH use reported. She denies any fevers, chills, chest pain, shortness of breath, vision changes, numbness, tingling, weakness, abdominal pain, vomiting, dysuria, hematuria, or rashes. She does endorse having white vaginal discharge 5 days with no vaginal itching or lesions. Her last menstrual period was 1.5 months ago but she's unsure of exact date.  Patient is a 29 y.o. female presenting with mental health disorder. The history is provided by the patient. No language interpreter was used.  Mental Health Problem Presenting symptoms: depression, hallucinations and suicidal thoughts   Presenting symptoms: no homicidal ideas, no self mutilation, no suicidal threats and no suicide attempt   Onset quality:  Gradual Duration:  2 days Timing:  Constant Progression:  Unchanged Chronicity:  Recurrent Context: noncompliance and stressful life event   Treatment compliance:  None of the time Time since last psychoactive medication taken:  2 days Relieved by:  None tried Worsened by:  Lack of sleep and alcohol Ineffective treatments:  None tried Associated symptoms: anxiety, feelings of worthlessness, headaches and insomnia   Associated symptoms: no abdominal pain and no chest pain   Risk factors: hx of mental illness and recent psychiatric admission     Past Medical History  Diagnosis Date  . Drug abuse   . GERD (gastroesophageal reflux disease)   . Depression   . Arthritis   . Seizures   . Chronic pain   . Methadone maintenance therapy patient   . Anxiety    Past Surgical History  Procedure Laterality Date  . Appendectomy    .  Cesarean section    . Mandible fracture surgery     Family History  Problem Relation Age of Onset  . Mental illness Mother   . Mental illness Brother   . Diabetes Maternal Grandmother   . Mental illness Maternal Grandmother   . Hypertension Maternal Grandfather   . Diabetes  Paternal Grandmother   . Heart disease Paternal Grandmother    History  Substance Use Topics  . Smoking status: Current Some Day Smoker -- 1.00 packs/day for 5 years    Last Attempt to Quit: 01/13/2011  . Smokeless tobacco: Never Used  . Alcohol Use: Yes   OB History    Gravida Para Term Preterm AB TAB SAB Ectopic Multiple Living   3 2   1 1          Review of Systems  Constitutional: Negative for fever and chills.  Eyes: Negative for photophobia and visual disturbance.  Respiratory: Negative for shortness of breath.   Cardiovascular: Negative for chest pain.  Gastrointestinal: Positive for nausea. Negative for vomiting, abdominal pain, diarrhea and constipation.  Genitourinary: Positive for vaginal discharge and menstrual problem (late menses). Negative for dysuria, hematuria and vaginal bleeding.  Musculoskeletal: Positive for myalgias (generalized) and back pain (chronic). Negative for arthralgias and neck pain.  Skin: Negative for rash.  Neurological: Positive for headaches. Negative for weakness, light-headedness and numbness.  Psychiatric/Behavioral: Positive for suicidal ideas, hallucinations and sleep disturbance. Negative for homicidal ideas, confusion and self-injury. The patient is nervous/anxious and has insomnia.    10 Systems reviewed and are negative for acute change except as noted in the HPI.    Allergies  Narcan and Suboxone  Home Medications   Prior to Admission medications   Medication Sig Start Date End Date Taking? Authorizing Provider  celecoxib (CELEBREX) 200 MG capsule Take 1 capsule (200 mg total) by mouth 2 (two) times daily. 03/07/14   Velna Hatchet May Agustin, NP  cloNIDine (CATAPRES - DOSED IN MG/24 HR) 0.1 mg/24hr patch Place 1 patch (0.1 mg total) onto the skin once a week. 03/07/14   Velna Hatchet May Agustin, NP  gabapentin (NEURONTIN) 300 MG capsule Take 2 capsules (600 mg total) by mouth 4 (four) times daily. 03/07/14   Lindwood Qua, NP  hydrOXYzine  (ATARAX/VISTARIL) 25 MG tablet Take 1 tablet (25 mg total) by mouth every 6 (six) hours as needed for anxiety. 03/07/14   Lindwood Qua, NP  lamoTRIgine (LAMICTAL) 25 MG tablet Take 1 tablet (25 mg total) by mouth daily. 03/07/14   Lindwood Qua, NP  meclizine (ANTIVERT) 25 MG tablet Take 1 tablet (25 mg total) by mouth 3 (three) times daily as needed for dizziness or nausea. 03/07/14   Velna Hatchet May Agustin, NP  mirtazapine (REMERON) 7.5 MG tablet Take 1 tablet (7.5 mg total) by mouth at bedtime. 03/07/14   Velna Hatchet May Agustin, NP  nicotine (NICODERM CQ - DOSED IN MG/24 HOURS) 21 mg/24hr patch Place 1 patch (21 mg total) onto the skin daily at 6 (six) AM. 03/07/14   Velna Hatchet May Agustin, NP  OLANZapine (ZYPREXA) 15 MG tablet Take 1 tablet (15 mg total) by mouth at bedtime. 03/07/14   Velna Hatchet May Agustin, NP  polyethylene glycol [facility] / GLYCOLAX) packet Take 17 g by mouth 2 (two) times daily. Hold if you develop diarrhea 03/07/14   [facility], NP  senna-docusate (SENOKOT-S) 8.6-50 MG per tablet Take 1 tablet by mouth at bedtime. Hold if you develop diarrhea 03/07/14   Lindwood Qua, NP  BP 114/54 mmHg  Pulse 91  Temp(Src) 97.4 F (36.3 C) (Oral)  Resp 20  SpO2 98%  LMP 02/13/2014 (Exact Date) Physical Exam  Constitutional: She is oriented to person, place, and time. Vital signs are normal. She appears well-developed and well-nourished.  Non-toxic appearance. She appears distressed (anxious).  Afebrile nontoxic, very anxious and tearful  HENT:  Head: Normocephalic and atraumatic.  Mouth/Throat: Oropharynx is clear and moist and mucous membranes are normal.  Eyes: Conjunctivae and EOM are normal. Pupils are equal, round, and reactive to light. Right eye exhibits no discharge. Left eye exhibits no discharge.  Neck: Normal range of motion. Neck supple.  Cardiovascular: Normal rate, regular rhythm, normal heart sounds and intact distal pulses.  Exam reveals no gallop and no  friction rub.   No murmur heard. Pulmonary/Chest: Effort normal and breath sounds normal. No respiratory distress. She has no decreased breath sounds. She has no wheezes. She has no rhonchi. She has no rales.  Abdominal: Soft. Normal appearance and bowel sounds are normal. She exhibits no distension. There is tenderness in the left lower quadrant. There is no rigidity, no rebound, no guarding, no CVA tenderness, no tenderness at McBurney's point and negative Murphy's sign.    Soft, ND, +BS throughout, with mild LLQ TTP with deep palpation which resolves entirely when not palpated, no r/g/r, neg murphy's, neg mcburney's, no CVA TTP   Genitourinary: Uterus normal. Pelvic exam was performed with patient supine. There is no rash, tenderness or lesion on the right labia. There is no rash, tenderness or lesion on the left labia. Cervix exhibits motion tenderness and discharge. Cervix exhibits no friability. Right adnexum displays tenderness. Right adnexum displays no mass and no fullness. Left adnexum displays tenderness. Left adnexum displays no mass and no fullness. No erythema, tenderness or bleeding in the vagina. Vaginal discharge found.  Chaperone present for exam. No rashes, lesions, or tenderness to external genitalia. No erythema, injury, or tenderness to vaginal mucosa. Mild white thick vaginal discharge in vault, no vaginal bleeding within vaginal vault. No  adnexal masses or fullness but with b/l adnexal TTP. +CMT, and white mucoid discharge from cervical os but no friability. Uterus non-deviated, mobile, nonTTP, and without enlargement.    Musculoskeletal: Normal range of motion.  MAE x4 Strength 5/5 in all extremities Sensation grossly intact in all extremities Distal pulses intact  Neurological: She is alert and oriented to person, place, and time. She has normal strength. No cranial nerve deficit or sensory deficit. GCS eye subscore is 4. GCS verbal subscore is 5. GCS motor subscore is 6.    No focal neuro deficits  Skin: Skin is warm, dry and intact. No rash noted.  Psychiatric: Her mood appears anxious. She is actively hallucinating (endorses auditory hallucinations). She expresses suicidal ideation. She expresses no homicidal ideation. She expresses suicidal plans. She expresses no homicidal plans.  Very anxious, shaking, tearful, endorses SI with plan, and endorses auditory hallucinations.   Nursing note and vitals reviewed.   ED Course  Procedures (including critical care time) Labs Review Labs Reviewed  WET PREP, GENITAL - Abnormal; Notable for the following:    Clue Cells Wet Prep HPF POC MODERATE (*)    WBC, Wet Prep HPF POC FEW (*)    All other components within normal limits  CBC WITH DIFFERENTIAL - Abnormal; Notable for the following:    Hemoglobin 11.7 (*)    HCT 32.7 (*)    All other components within normal limits  URINE RAPID  DRUG SCREEN (HOSP PERFORMED) - Abnormal; Notable for the following:    Cocaine POSITIVE (*)    Benzodiazepines POSITIVE (*)    Tetrahydrocannabinol POSITIVE (*)    All other components within normal limits  URINALYSIS, ROUTINE W REFLEX MICROSCOPIC - Abnormal; Notable for the following:    APPearance HAZY (*)    All other components within normal limits  ACETAMINOPHEN LEVEL - Abnormal; Notable for the following:    Acetaminophen (Tylenol), Serum <10.0 (*)    All other components within normal limits  COMPREHENSIVE METABOLIC PANEL  ETHANOL  SALICYLATE LEVEL  RPR  HIV ANTIBODY (ROUTINE TESTING)  LAMOTRIGINE LEVEL  POC URINE PREG, ED  GC/CHLAMYDIA PROBE AMP ([place])    Imaging Review US Transvaginal Non-ob  03/24/2014   CLINICAL DATA:  Vaginal discharge, pelvic pain. Concern for pelvic inflammatory disease or tubo-ovarian abscess.  EXAM: TRANSABDOMINAL AND TRANSVAGINAL ULTRASOUND OF PELVIS  DOPPLER ULTRASOUND OF OVARIES  TECHNIQUE: Both transabdominal and transvaginal ultrasound examinations of the pelvis were performed.  Transabdominal technique was performed for global imaging of the pelvis including uterus, ovaries, adnexal regions, and pelvic cul-de-sac.  It was necessary to proceed with endovaginal exam following the transabdominal exam to visualize the uterus and ovaries. Color and duplex Doppler ultrasound was utilized to evaluate blood flow to the ovaries.  COMPARISON:  CT 05/05/2006  FINDINGS: Uterus  Measurements: Normal in size at 10.0 x 5.0 x 6.4 cm.  Endometrium  Thickness: Normal in thickness for premenopausal female at 13 mm.  Right ovary  Measurements: Normal in size at 3.1 x 2.4 x 3.5 cm. Normal color Doppler flow and spectral waveforms.  Left ovary  Measurements: Normal in size at 2.9 x 3.1 x 2.6 cm. There is a dominant follicle measuring 2.2 cm. Normal color Doppler flow and spectral waveforms.  Pulsed Doppler evaluation of both ovaries demonstrates normal low-resistance arterial and venous waveforms.  Other findings  Small volume free fluid .  IMPRESSION: 1. Normal ovaries without evidence of torsion. 2. Normal uterus.   Electronically Signed   By: Genevive Bi M.D.   On: 03/24/2014 15:17   US Pelvis Complete  03/24/2014   CLINICAL DATA:  Vaginal discharge, pelvic pain. Concern for pelvic inflammatory disease or tubo-ovarian abscess.  EXAM: TRANSABDOMINAL AND TRANSVAGINAL ULTRASOUND OF PELVIS  DOPPLER ULTRASOUND OF OVARIES  TECHNIQUE: Both transabdominal and transvaginal ultrasound examinations of the pelvis were performed. Transabdominal technique was performed for global imaging of the pelvis including uterus, ovaries, adnexal regions, and pelvic cul-de-sac.  It was necessary to proceed with endovaginal exam following the transabdominal exam to visualize the uterus and ovaries. Color and duplex Doppler ultrasound was utilized to evaluate blood flow to the ovaries.  COMPARISON:  CT 05/05/2006  FINDINGS: Uterus  Measurements: Normal in size at 10.0 x 5.0 x 6.4 cm.  Endometrium  Thickness: Normal in  thickness for premenopausal female at 13 mm.  Right ovary  Measurements: Normal in size at 3.1 x 2.4 x 3.5 cm. Normal color Doppler flow and spectral waveforms.  Left ovary  Measurements: Normal in size at 2.9 x 3.1 x 2.6 cm. There is a dominant follicle measuring 2.2 cm. Normal color Doppler flow and spectral waveforms.  Pulsed Doppler evaluation of both ovaries demonstrates normal low-resistance arterial and venous waveforms.  Other findings  Small volume free fluid .  IMPRESSION: 1. Normal ovaries without evidence of torsion. 2. Normal uterus.   Electronically Signed   By: Genevive Bi M.D.   On: 03/24/2014 15:17  [place] Art/ven Flow Abd Pelv Doppler  03/24/2014   CLINICAL DATA:  Vaginal discharge, pelvic pain. Concern for pelvic inflammatory disease or tubo-ovarian abscess.  EXAM: TRANSABDOMINAL AND TRANSVAGINAL ULTRASOUND OF PELVIS  DOPPLER ULTRASOUND OF OVARIES  TECHNIQUE: Both transabdominal and transvaginal ultrasound examinations of the pelvis were performed. Transabdominal technique was performed for global imaging of the pelvis including uterus, ovaries, adnexal regions, and pelvic cul-de-sac.  It was necessary to proceed with endovaginal exam following the transabdominal exam to visualize the uterus and ovaries. Color and duplex Doppler ultrasound was utilized to evaluate blood flow to the ovaries.  COMPARISON:  CT 05/05/2006  FINDINGS: Uterus  Measurements: Normal in size at 10.0 x 5.0 x 6.4 cm.  Endometrium  Thickness: Normal in thickness for premenopausal female at 13 mm.  Right ovary  Measurements: Normal in size at 3.1 x 2.4 x 3.5 cm. Normal color Doppler flow and spectral waveforms.  Left ovary  Measurements: Normal in size at 2.9 x 3.1 x 2.6 cm. There is a dominant follicle measuring 2.2 cm. Normal color Doppler flow and spectral waveforms.  Pulsed Doppler evaluation of both ovaries demonstrates normal low-resistance arterial and venous waveforms.  Other findings  Small volume free fluid .   IMPRESSION: 1. Normal ovaries without evidence of torsion. 2. Normal uterus.   Electronically Signed   By: Genevive Bi M.D.   On: 03/24/2014 15:17     EKG Interpretation None      MDM   Final diagnoses:  Pelvic pain in female  Suicidal ideation  Depression  Substance abuse  Vaginal discharge  BV (bacterial vaginosis)    29 y.o. female presenting for SI and withdrawal from benzos and methadone. Has suicide plan. Has hx of seizures with benzo withdrawal. Also complains of anxiety. Exam reveals mild LLQ abd pain, and pt with vaginal discharge, will proceed with STD work up given sexual promiscuity, and perform pelvic exam. Will give xanax now for anxiety and symptoms, but discussed that she cannot get the dose she takes at home (reports 1mg  dosing, not in chart). Will give ibuprofen for generalized pain, which is likely from w/d.   12:56 PM Pt feeling slightly better. EtOH, ASA, APAP levels all WNL. CBC w/diff showing baseline anemia. CMP WNL. RPR and HIV in progress, lamotrigine level in progress. Wet prep pending. UDS and upreg/Urinalysis pending. Will empirically treat for GC/CT/trich since pt has hx of these and has multiple sexual partners, having sex for money. Will obtain pelvic U/S given +CMT and adnexal tenderness.  Eating and drinking well, seems much calmer than previously. Will reassess shortly.  3:25 PM Upreg neg, U/A neg. UDS with +benzos, cocaine, and THC. Wet prep with moderate clue cells therefore will tx for BV. Transvaginal U/S neg. At this time, pt is medically cleared, will consult TTS. Pt continues to be stable and feels well. Please see BH team notes for further updates on care. Home meds reordered.  [place], [place] 03/24/14 1533  Derwood Kaplan, MD 03/24/14 [phone]

## 2014-03-24 NOTE — ED Notes (Signed)
Pt reporting nausea.  Will medicate with PRN Zofran.

## 2014-03-24 NOTE — ED Provider Notes (Signed)
6:35 PM patient had a generalized seizure lasting approximate 5 minutes. She was treated with Ativan 1 mg IV, ordered by me at 6:55 PM she is alert Glasgow Coma Score 15, complains generalized malaise. I spoke with toxicologist at [facility] who suggested the patient will require large doses of Xanax specifically 2-3 mg 4 times daily to prevent benzodiazepine withdrawal. After that Xanax can be tapered. Xanax works better than other benzodiazepines for chronic Xanax abuse. Patient has been using Xanax 1 mg every 2 hours for the past 3 weeks Patient is at risk for seizures. Speak with nurse practitioner behavioral [facility] who requests medical admission. Spoke with Dr. Lovell Sheehan plan 23 hour observation step down unit At 8:25 PM patient is alert Glasgow Coma Score 15 feels much more comfortable and appears comfortable after treatment with Xanax Results for orders placed or performed during the hospital encounter of 03/24/14  Wet prep, genital  Result Value Ref Range   Yeast Wet Prep HPF POC NONE SEEN NONE SEEN   Trich, Wet Prep NONE SEEN NONE SEEN   Clue Cells Wet Prep HPF POC MODERATE (A) NONE SEEN   WBC, Wet Prep HPF POC FEW (A) NONE SEEN  CBC WITH DIFFERENTIAL  Result Value Ref Range   WBC 5.8 4.0 - 10.5 K/uL   RBC 3.88 3.87 - 5.11 MIL/uL   Hemoglobin 11.7 (L) 12.0 - 15.0 g/dL   HCT 21.3 (L) 08.6 - 57.8 %   MCV 84.3 78.0 - 100.0 fL   MCH 30.2 26.0 - 34.0 pg   MCHC 35.8 30.0 - 36.0 g/dL   RDW 46.9 62.9 - 52.8 %   Platelets 221 150 - 400 K/uL   Neutrophils Relative % 70 43 - 77 %   Neutro Abs 4.1 1.7 - 7.7 K/uL   Lymphocytes Relative 24 12 - 46 %   Lymphs Abs 1.4 0.7 - 4.0 K/uL   Monocytes Relative 5 3 - 12 %   Monocytes Absolute 0.3 0.1 - 1.0 K/uL   Eosinophils Relative 1 0 - 5 %   Eosinophils Absolute 0.1 0.0 - 0.7 K/uL   Basophils Relative 0 0 - 1 %   Basophils Absolute 0.0 0.0 - 0.1 K/uL  Comprehensive metabolic panel  Result Value Ref Range   Sodium 137 135 -  145 mmol/L   Potassium 3.5 3.5 - 5.1 mmol/L   Chloride 106 96 - 112 mEq/L   CO2 23 19 - 32 mmol/L   Glucose, Bld 88 70 - 99 mg/dL   BUN 15 6 - 23 mg/dL   Creatinine, Ser 4.13 0.50 - 1.10 mg/dL   Calcium 9.0 8.4 - 24.4 mg/dL   Total Protein 7.3 6.0 - 8.3 g/dL   Albumin 4.0 3.5 - 5.2 g/dL   AST 28 0 - 37 U/L   ALT 27 0 - 35 U/L   Alkaline Phosphatase 54 39 - 117 U/L   Total Bilirubin 0.4 0.3 - 1.2 mg/dL   GFR calc non Af Amer >90 >90 mL/min   GFR calc Af Amer >90 >90 mL/min   Anion gap 8 5 - 15  Drug screen panel, emergency  Result Value Ref Range   Opiates NONE DETECTED NONE DETECTED   Cocaine POSITIVE (A) NONE DETECTED   Benzodiazepines POSITIVE (A) NONE DETECTED   Amphetamines NONE DETECTED NONE DETECTED   Tetrahydrocannabinol POSITIVE (A) NONE DETECTED   Barbiturates NONE DETECTED NONE DETECTED  Ethanol  Result Value Ref Range   Alcohol, Ethyl (B) <5  0 - 9 mg/dL  Urinalysis, Routine w reflex microscopic  Result Value Ref Range   Color, Urine YELLOW YELLOW   APPearance HAZY (A) CLEAR   Specific Gravity, Urine 1.025 1.005 - 1.030   pH 5.5 5.0 - 8.0   Glucose, UA NEGATIVE NEGATIVE mg/dL   Hgb urine dipstick NEGATIVE NEGATIVE   Bilirubin Urine NEGATIVE NEGATIVE   Ketones, ur NEGATIVE NEGATIVE mg/dL   Protein, ur NEGATIVE NEGATIVE mg/dL   Urobilinogen, UA 0.2 0.0 - 1.0 mg/dL   Nitrite NEGATIVE NEGATIVE   Leukocytes, UA NEGATIVE NEGATIVE  Acetaminophen level  Result Value Ref Range   Acetaminophen (Tylenol), Serum <10.0 (L) 10 - 30 ug/mL  Salicylate level  Result Value Ref Range   Salicylate Lvl <4.0 2.8 - 20.0 mg/dL  POC urine preg, ED ([facility])  Result Value Ref Range   Preg Test, Ur NEGATIVE NEGATIVE  CBG monitoring, ED  Result Value Ref Range   Glucose-Capillary 106 (H) 70 - 99 mg/dL  GC/Chlamydia probe amp [facility]  Result Value Ref Range   Chlamydia CT: Negative    Neisseria gonorrhea NG: Negative    [place] Transvaginal Non-ob  03/24/2014   CLINICAL  DATA:  Vaginal discharge, pelvic pain. Concern for pelvic inflammatory disease or tubo-ovarian abscess.  EXAM: TRANSABDOMINAL AND TRANSVAGINAL ULTRASOUND OF PELVIS  DOPPLER ULTRASOUND OF OVARIES  TECHNIQUE: Both transabdominal and transvaginal ultrasound examinations of the pelvis were performed. Transabdominal technique was performed for global imaging of the pelvis including uterus, ovaries, adnexal regions, and pelvic cul-de-sac.  It was necessary to proceed with endovaginal exam following the transabdominal exam to visualize the uterus and ovaries. Color and duplex Doppler ultrasound was utilized to evaluate blood flow to the ovaries.  COMPARISON:  CT 05/05/2006  FINDINGS: Uterus  Measurements: Normal in size at 10.0 x 5.0 x 6.4 cm.  Endometrium  Thickness: Normal in thickness for premenopausal female at 13 mm.  Right ovary  Measurements: Normal in size at 3.1 x 2.4 x 3.5 cm. Normal color Doppler flow and spectral waveforms.  Left ovary  Measurements: Normal in size at 2.9 x 3.1 x 2.6 cm. There is a dominant follicle measuring 2.2 cm. Normal color Doppler flow and spectral waveforms.  Pulsed Doppler evaluation of both ovaries demonstrates normal low-resistance arterial and venous waveforms.  Other findings  Small volume free fluid .  IMPRESSION: 1. Normal ovaries without evidence of torsion. 2. Normal uterus.   Electronically Signed   By: Genevive BiStewart  Edmunds M.D.   On: 03/24/2014 15:17   [place] Pelvis Complete  03/24/2014   CLINICAL DATA:  Vaginal discharge, pelvic pain. Concern for pelvic inflammatory disease or tubo-ovarian abscess.  EXAM: TRANSABDOMINAL AND TRANSVAGINAL ULTRASOUND OF PELVIS  DOPPLER ULTRASOUND OF OVARIES  TECHNIQUE: Both transabdominal and transvaginal ultrasound examinations of the pelvis were performed. Transabdominal technique was performed for global imaging of the pelvis including uterus, ovaries, adnexal regions, and pelvic cul-de-sac.  It was necessary to proceed with endovaginal exam  following the transabdominal exam to visualize the uterus and ovaries. Color and duplex Doppler ultrasound was utilized to evaluate blood flow to the ovaries.  COMPARISON:  CT 05/05/2006  FINDINGS: Uterus  Measurements: Normal in size at 10.0 x 5.0 x 6.4 cm.  Endometrium  Thickness: Normal in thickness for premenopausal female at 13 mm.  Right ovary  Measurements: Normal in size at 3.1 x 2.4 x 3.5 cm. Normal color Doppler flow and spectral waveforms.  Left ovary  Measurements: Normal in size at 2.9  x 3.1 x 2.6 cm. There is a dominant follicle measuring 2.2 cm. Normal color Doppler flow and spectral waveforms.  Pulsed Doppler evaluation of both ovaries demonstrates normal low-resistance arterial and venous waveforms.  Other findings  Small volume free fluid .  IMPRESSION: 1. Normal ovaries without evidence of torsion. 2. Normal uterus.   Electronically Signed   By: Genevive Bi M.D.   On: 03/24/2014 15:17   [place] Art/ven Flow Abd Pelv Doppler  03/24/2014   CLINICAL DATA:  Vaginal discharge, pelvic pain. Concern for pelvic inflammatory disease or tubo-ovarian abscess.  EXAM: TRANSABDOMINAL AND TRANSVAGINAL ULTRASOUND OF PELVIS  DOPPLER ULTRASOUND OF OVARIES  TECHNIQUE: Both transabdominal and transvaginal ultrasound examinations of the pelvis were performed. Transabdominal technique was performed for global imaging of the pelvis including uterus, ovaries, adnexal regions, and pelvic cul-de-sac.  It was necessary to proceed with endovaginal exam following the transabdominal exam to visualize the uterus and ovaries. Color and duplex Doppler ultrasound was utilized to evaluate blood flow to the ovaries.  COMPARISON:  CT 05/05/2006  FINDINGS: Uterus  Measurements: Normal in size at 10.0 x 5.0 x 6.4 cm.  Endometrium  Thickness: Normal in thickness for premenopausal female at 13 mm.  Right ovary  Measurements: Normal in size at 3.1 x 2.4 x 3.5 cm. Normal color Doppler flow and spectral waveforms.  Left ovary   Measurements: Normal in size at 2.9 x 3.1 x 2.6 cm. There is a dominant follicle measuring 2.2 cm. Normal color Doppler flow and spectral waveforms.  Pulsed Doppler evaluation of both ovaries demonstrates normal low-resistance arterial and venous waveforms.  Other findings  Small volume free fluid .  IMPRESSION: 1. Normal ovaries without evidence of torsion. 2. Normal uterus.   Electronically Signed   By: Genevive Bi M.D.   On: 03/24/2014 15:17    Diagnosis #1seizure #2 polysubstance abuse 3 benzodiazepine withdrawal CRITICAL CARE Performed by: Doug Sou Total critical care time: 30 minute Critical care time was exclusive of separately billable procedures and treating other patients. Critical care was necessary to treat or prevent imminent or life-threatening deterioration. Critical care was time spent personally by me on the following activities: development of treatment plan with patient and/or surrogate as well as nursing, discussions with consultants, evaluation of patient's response to treatment, examination of patient, obtaining history from patient or surrogate, ordering and performing treatments and interventions, ordering and review of laboratory studies, ordering and review of radiographic studies, pulse oximetry and re-evaluation of patient's condition.  Doug Sou, MD 03/24/14 2033

## 2014-03-24 NOTE — ED Notes (Signed)
Mercedes, PA at bedside. 

## 2014-03-24 NOTE — BH Assessment (Signed)
[facility] Assessment Progress Note Accepted by Renata Capriceonrad to [phone]. Please call report to [phone], and fax voluntary paperwork to [phone].

## 2014-03-24 NOTE — ED Notes (Signed)
LUNCH ORDERED FOR PATIENT 

## 2014-03-24 NOTE — ED Notes (Signed)
Pt has returned from ultrasound and walked immediately to use the phone

## 2014-03-25 DIAGNOSIS — F1120 Opioid dependence, uncomplicated: Secondary | ICD-10-CM

## 2014-03-25 DIAGNOSIS — F1994 Other psychoactive substance use, unspecified with psychoactive substance-induced mood disorder: Secondary | ICD-10-CM

## 2014-03-25 LAB — CBC
HEMATOCRIT: 32 % — AB (ref 36.0–46.0)
Hemoglobin: 11.2 g/dL — ABNORMAL LOW (ref 12.0–15.0)
MCH: 29.8 pg (ref 26.0–34.0)
MCHC: 35 g/dL (ref 30.0–36.0)
MCV: 85.1 fL (ref 78.0–100.0)
Platelets: 194 10*3/uL (ref 150–400)
RBC: 3.76 MIL/uL — ABNORMAL LOW (ref 3.87–5.11)
RDW: 12.6 % (ref 11.5–15.5)
WBC: 3.4 10*3/uL — ABNORMAL LOW (ref 4.0–10.5)

## 2014-03-25 LAB — BASIC METABOLIC PANEL
Anion gap: 9 (ref 5–15)
BUN: 17 mg/dL (ref 6–23)
CALCIUM: 9.1 mg/dL (ref 8.4–10.5)
CHLORIDE: 106 meq/L (ref 96–112)
CO2: 24 mmol/L (ref 19–32)
CREATININE: 0.6 mg/dL (ref 0.50–1.10)
GFR calc Af Amer: >90 mL/min (ref >90)
GFR calc non Af Amer: >90 mL/min (ref >90)
Glucose, Bld: 95 mg/dL (ref 70–99)
Potassium: 4 mmol/L (ref 3.5–5.1)
Sodium: 139 mmol/L (ref 135–145)

## 2014-03-25 LAB — MRSA PCR SCREENING: MRSA by PCR: NEGATIVE

## 2014-03-25 MED ORDER — ONDANSETRON HCL 4 MG/2ML IJ SOLN: 4.0000 mg | Freq: Four times a day (QID) | INTRAMUSCULAR | Status: DC | PRN

## 2014-03-25 MED ORDER — SODIUM CHLORIDE 0.9 % IV SOLN
INTRAVENOUS | Status: DC
  Administered 2014-03-25: 06:00:00 via INTRAVENOUS

## 2014-03-25 MED ORDER — ACETAMINOPHEN 650 MG RE SUPP: 650.0000 mg | Freq: Four times a day (QID) | RECTAL | Status: DC | PRN

## 2014-03-25 MED ORDER — ENOXAPARIN SODIUM 40 MG/0.4ML ~~LOC~~ SOLN
40.0000 mg | SUBCUTANEOUS | Status: DC
  Administered 2014-03-25 – 2014-03-30 (×6): 40 mg via SUBCUTANEOUS
  Filled 2014-03-25 (×6): qty 0.4

## 2014-03-25 MED ORDER — ALUM & MAG HYDROXIDE-SIMETH 200-200-20 MG/5ML PO SUSP
30.0000 mL | Freq: Four times a day (QID) | ORAL | Status: DC | PRN
  Filled 2014-03-25: qty 30

## 2014-03-25 MED ORDER — HYDROMORPHONE HCL 1 MG/ML IJ SOLN: 0.5000 mg | INTRAMUSCULAR | Status: DC | PRN

## 2014-03-25 MED ORDER — ALPRAZOLAM 0.5 MG PO TABS
3.0000 mg | ORAL_TABLET | Freq: Four times a day (QID) | ORAL | Status: DC
  Administered 2014-03-25 (×4): 3 mg via ORAL
  Administered 2014-03-26: 2.5 mg via ORAL
  Administered 2014-03-26 – 2014-03-27 (×4): 3 mg via ORAL
  Filled 2014-03-25: qty 6
  Filled 2014-03-25: qty 12
  Filled 2014-03-25 (×7): qty 6

## 2014-03-25 MED ORDER — ONDANSETRON HCL 4 MG PO TABS
4.0000 mg | ORAL_TABLET | Freq: Four times a day (QID) | ORAL | Status: DC | PRN
  Administered 2014-03-27: 4 mg via ORAL
  Filled 2014-03-25: qty 1

## 2014-03-25 MED ORDER — ACETAMINOPHEN 325 MG PO TABS
650.0000 mg | ORAL_TABLET | Freq: Four times a day (QID) | ORAL | Status: DC | PRN
  Administered 2014-03-27 – 2014-03-28 (×2): 650 mg via ORAL
  Filled 2014-03-25: qty 2

## 2014-03-25 MED ORDER — OXYCODONE HCL 5 MG PO TABS
5.0000 mg | ORAL_TABLET | ORAL | Status: DC | PRN
  Administered 2014-03-30: 5 mg via ORAL
  Filled 2014-03-25: qty 1

## 2014-03-25 NOTE — Progress Notes (Signed)
Patient ID: [name]  female  ZOX:[id number]    DOB: 03/20/1985    DOA: 03/24/2014  PCP: No primary care provider on file.  Brief history of present illness  Patient is a 29 year old female with polysubstance abuse presented with wanting detox treatment, suicidal ideation and having a plan, auditory hallucinations. Patient reported increased anxiety and stresses lately, no longer has Medicaid, was not able to get her prescribed Xanax associate was getting Xanax off the streets. She reported that she had been taking Xanax 1 mg tablet about 12 times a day for last 3 weeks. She also goes to methadone clinic and now down to 40 mg daily. She reported that she wanted to kill herself as she lost custody of her daughter who is now adopted. Patient was seen by EDP, was awaiting placement at behavioral health but suffered a tonic-clonic seizure witnessed by staff lasted for 1 minute. UDS positive for benzodiazepine, cocaine and THC. Patient was admitted for further workup.  Assessment/Plan: Principal Problem:   Benzodiazepine withdrawal - Patient lost IV access, refuses to place IV access or check the labs, placed on oral Xanax 3 mg Q 6hrs. I have requested psychiatry to see patient.   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  Presumptive cervicitis and bacterial vaginosis - Administered a single doses of Rocephin, azithromycin and metronidazole  DVT Prophylaxis: Lovenox  Code Status: FC  Family Communication:  Disposition:Continue in stepdown  Consultants: Psychiatry   Procedures: None  Antibiotics:  Rocephin, Zithromax, metronidazole    Subjective: Patient seen and examined, agitated over lost papers which had contact numbers of her probation officer etc., refusing IV access or labs   Objective: Weight change:   Intake/Output Summary (Last 24 hours) at 03/25/14 1023 Last data filed at 03/25/14 1001  Gross per 24 hour  Intake    395 ml  Output      0 ml  Net    395 ml   Blood pressure 96/58, pulse 79, temperature 98.4 F (36.9 C), temperature source Oral, resp. rate 13, height  (1.6 m), weight 79.3 kg (174 lb 13.2 oz), last menstrual period 02/13/2014, SpO2 95 %.  Physical Exam: General: Alert and awake, oriented, agitated  CVS: S1-S2 clear, no murmur rubs or gallops Chest: clear to auscultation bilaterally, no wheezing, rales or rhonchi Abdomen: soft nontender, nondistended, normal bowel sounds  Extremities: no cyanosis, clubbing or edema noted bilaterally Neuro: Cranial nerves II-XII intact, no focal neurological deficits  Lab Results: Basic Metabolic Panel:  Recent Labs Lab 03/24/14 1121  NA 137  K 3.5  CL 106  CO2 23  GLUCOSE 88  BUN 15  CREATININE 0.65  CALCIUM 9.0   Liver Function Tests:  Recent Labs Lab 03/24/14 1121  AST 28  ALT 27  ALKPHOS 54  BILITOT 0.4  PROT 7.3  ALBUMIN 4.0   No results for input(s): LIPASE, AMYLASE in the last 168 hours. No results for input(s): AMMONIA in the last 168 hours. CBC:  Recent Labs Lab 03/24/14 1121  WBC 5.8  NEUTROABS 4.1  HGB 11.7*  HCT 32.7*  MCV 84.3  PLT 221   Cardiac Enzymes: No results for input(s): CKTOTAL, CKMB, CKMBINDEX, TROPONINI in the last 168 hours. BNP: Invalid input(s): POCBNP CBG:  Recent Labs Lab 03/24/14 1833  GLUCAP 106*     Micro Results: Recent Results (from the past 240 hour(s))  Wet prep, genital     Status: Abnormal   Collection Time: 03/24/14 12:44 PM  Result Value Ref Range Status   Yeast Wet Prep HPF POC NONE SEEN  NONE SEEN Final   Trich, Wet Prep NONE SEEN NONE SEEN Final   Clue Cells Wet Prep HPF POC MODERATE (A) NONE SEEN Final   WBC, Wet Prep HPF POC FEW (A) NONE SEEN Final  MRSA PCR Screening     Status: None   Collection Time: 03/25/14  4:41 AM  Result Value Ref Range Status   MRSA by PCR NEGATIVE NEGATIVE Final    Comment:        The GeneXpert MRSA Assay (FDA approved for NASAL specimens only), is one component of a comprehensive MRSA colonization surveillance program. It is not intended to diagnose MRSA infection nor to guide or monitor treatment for MRSA infections.     Studies/Results: [place] Transvaginal Non-ob  03/24/2014   CLINICAL DATA:  Vaginal discharge, pelvic pain. Concern for pelvic inflammatory disease or tubo-ovarian abscess.  EXAM: TRANSABDOMINAL AND TRANSVAGINAL ULTRASOUND OF PELVIS  DOPPLER ULTRASOUND OF OVARIES  TECHNIQUE: Both transabdominal and transvaginal ultrasound examinations of the pelvis were performed. Transabdominal technique was performed for global imaging of the pelvis including uterus, ovaries, adnexal regions, and pelvic cul-de-sac.  It was necessary to proceed with endovaginal exam following the transabdominal exam to visualize the uterus and ovaries. Color and duplex Doppler ultrasound was utilized to evaluate blood flow to the ovaries.  COMPARISON:  CT 05/05/2006  FINDINGS: Uterus  Measurements: Normal in size at 10.0 x 5.0 x 6.4 cm.  Endometrium  Thickness: Normal in thickness for premenopausal female at 13 mm.  Right ovary  Measurements: Normal in size at 3.1 x 2.4 x 3.5 cm. Normal color Doppler flow and spectral waveforms.  Left ovary  Measurements: Normal in size at 2.9 x 3.1 x 2.6 cm. There is a dominant follicle measuring 2.2 cm. Normal color Doppler flow and spectral waveforms.  Pulsed Doppler evaluation of both ovaries demonstrates normal low-resistance arterial and venous waveforms.  Other findings  Small volume free fluid .  IMPRESSION: 1. Normal ovaries  without evidence of torsion. 2. Normal uterus.   Electronically Signed   By: Genevive BiStewart  Edmunds M.D.   On: 03/24/2014 15:17   [place] Pelvis Complete  03/24/2014   CLINICAL DATA:  Vaginal discharge, pelvic pain. Concern for pelvic inflammatory disease or tubo-ovarian abscess.  EXAM: TRANSABDOMINAL AND TRANSVAGINAL ULTRASOUND OF PELVIS  DOPPLER ULTRASOUND OF OVARIES  TECHNIQUE: Both transabdominal and transvaginal ultrasound examinations of the pelvis were performed. Transabdominal technique was performed for global imaging of the pelvis including uterus, ovaries, adnexal regions, and pelvic cul-de-sac.  It was necessary to proceed with endovaginal exam following the transabdominal exam to visualize the uterus and ovaries. Color and duplex Doppler ultrasound was utilized to evaluate blood flow to the ovaries.  COMPARISON:  CT 05/05/2006  FINDINGS: Uterus  Measurements: Normal in size at 10.0 x 5.0 x 6.4 cm.  Endometrium  Thickness: Normal in thickness for premenopausal female at 13 mm.  Right ovary  Measurements: Normal in size at 3.1 x 2.4 x 3.5 cm. Normal color Doppler flow and spectral waveforms.  Left ovary  Measurements: Normal in size at 2.9 x 3.1 x 2.6 cm. There is a dominant follicle measuring 2.2 cm. Normal color Doppler flow and spectral waveforms.  Pulsed Doppler evaluation of both ovaries demonstrates normal low-resistance arterial and venous waveforms.  Other findings  Small volume free fluid .  IMPRESSION: 1. Normal ovaries without evidence of torsion. 2. Normal uterus.   Electronically Signed   By: Genevive Bi M.D.   On: 03/24/2014 15:17   [place] Art/ven Flow Abd Pelv Doppler  03/24/2014   CLINICAL DATA:  Vaginal discharge, pelvic pain. Concern for pelvic inflammatory disease or tubo-ovarian abscess.  EXAM: TRANSABDOMINAL AND TRANSVAGINAL ULTRASOUND OF PELVIS  DOPPLER ULTRASOUND OF OVARIES  TECHNIQUE: Both transabdominal and transvaginal ultrasound examinations of the pelvis were performed.  Transabdominal technique was performed for global imaging of the pelvis including uterus, ovaries, adnexal regions, and pelvic cul-de-sac.  It was necessary to proceed with endovaginal exam following the transabdominal exam to visualize the uterus and ovaries. Color and duplex Doppler ultrasound was utilized to evaluate blood flow to the ovaries.  COMPARISON:  CT 05/05/2006  FINDINGS: Uterus  Measurements: Normal in size at 10.0 x 5.0 x 6.4 cm.  Endometrium  Thickness: Normal in thickness for premenopausal female at 13 mm.  Right ovary  Measurements: Normal in size at 3.1 x 2.4 x 3.5 cm. Normal color Doppler flow and spectral waveforms.  Left ovary  Measurements: Normal in size at 2.9 x 3.1 x 2.6 cm. There is a dominant follicle measuring 2.2 cm. Normal color Doppler flow and spectral waveforms.  Pulsed Doppler evaluation of both ovaries demonstrates normal low-resistance arterial and venous waveforms.  Other findings  Small volume free fluid .  IMPRESSION: 1. Normal ovaries without evidence of torsion. 2. Normal uterus.   Electronically Signed   By: Genevive Bi M.D.   On: 03/24/2014 15:17    Medications: Scheduled Meds: . ALPRAZolam  3 mg Oral Q6H  . ALPRAZolam  3 mg Oral TID WC & HS  . celecoxib  200 mg Oral BID WC  . [START ON 03/26/2014] cloNIDine  0.1 mg Transdermal Q Sun  . enoxaparin (LOVENOX) injection  40 mg Subcutaneous Q24H  . gabapentin  600 mg Oral QID  . lamoTRIgine  25 mg Oral Daily  . methadone  40 mg Oral Daily  . metroNIDAZOLE  500 mg Oral Q12H  . mirtazapine  7.5 mg Oral QHS  . nicotine  21 mg Transdermal Daily  . OLANZapine  15 mg Oral QHS   Time spent: 25 minutes   LOS: 1 day   RAI,RIPUDEEP M.D. [facility] 03/25/2014, 10:23 AM Pager: [phone]  If 7PM-7AM, please contact night-coverage [email or web address] Password [email or web address]

## 2014-03-25 NOTE — Progress Notes (Signed)
Pt refused to allow lab to draw blood after 3 attempts. Pt pulled IV access out and pt nurse attempted to restart IV but was unable. IV team nurse was called but pt refused to allow IV team nurse to start IV. Pt is stating she wants to go to the ED and look for a paper that was left in ED. Pt stated she will kill herself, per sitter, if not allowed to go to the ED and look for the paper. NP was notified and will update day provider medical team.    Caralee AtesJason Analysia Dungee, RN

## 2014-03-25 NOTE — Progress Notes (Signed)
Received report from night nurse that pt refused IV team placing an IV therefore pt has no IV at this time and Dr. Isidoro Donningai is aware.

## 2014-03-25 NOTE — Consult Note (Signed)
[facility] Face-to-Face Psychiatry Consult   Reason for Consult:  Polysubstance abuse, suicidal ideation Referring Physician:  Dr. Thurmon Fair is an 29 y.o. female. Total Time spent with patient: 30 minutes  Assessment: AXIS I:  Substance Abuse and Substance Induced Mood Disorder AXIS II:  Cluster B Traits AXIS III:   Past Medical History  Diagnosis Date  . Drug abuse   . GERD (gastroesophageal reflux disease)   . Depression   . Arthritis   . Seizures   . Chronic pain   . Methadone maintenance therapy patient   . Anxiety    AXIS IV:  housing problems and other psychosocial or environmental problems AXIS V:  21-30 behavior considerably influenced by delusions or hallucinations OR serious impairment in judgment, communication OR inability to function in almost all areas  Plan:  Recommend psychiatric Inpatient admission when medically cleared.  Subjective:   [name] is a 29 y.o. female patient admitted with polysubstance abuse and seizure.  HPI:  This patient is a 29 year old single white female with a history of polysubstance abuse and multiple detox admissions who presented to the ED with complaints of wanting detox and having suicidal thoughts with a plan. She also admitted to auditory hallucinations. She claims she no longer was able to get her Xanax had been getting it off the street. She had been using Xanax 1 mg, 12 tabs a day for 3 weeks. She is also on methadone maintenance at 40 mg daily. On admission she claims she wanted to kill herself because she lost custody of her daughter and her daughter has now been adopted. She was awaiting admission to the behavioral health hospital when she had a seizure in the ED. Urine drug screen was positive for benzodiazepines cocaine and THC.  Now that she is admitted she is on alprazolam 3 mg every 6 hours. She feels better now and is no longer as anxious and denies suicidal ideation. She claims she has a plan to return to her  boyfriend's house and he will maintain all of her medicines and she wants to stay on Xanax. I told her she could not stand this high dosage of Xanax outside the hospital and she wants to be on Xanax 1 mg 4 times a day. Have again explained that she has to get to a lower dosage per day before she can be discharged and perhaps transition to clonazepam which is safer. She does not want to be hospitalized denies any thoughts or plans to harm herself denies auditory or visual hallucinations. She was just discharged from behavioral hospital approximately 2 weeks ago HPI Elements:   Location:  Global. Quality:  Severe. Severity:  Severe. Timing:  2 weeks. Duration:  Years. Context:  Recent adoption of daughter and overuse of Xanax.  Past Psychiatric History: Past Medical History  Diagnosis Date  . Drug abuse   . GERD (gastroesophageal reflux disease)   . Depression   . Arthritis   . Seizures   . Chronic pain   . Methadone maintenance therapy patient   . Anxiety     reports that she has been smoking.  She has never used smokeless tobacco. She reports that she drinks alcohol. She reports that she uses illicit drugs (Heroin, Marijuana, and Benzodiazepines). Family History  Problem Relation Age of Onset  . Mental illness Mother   . Mental illness Brother   . Diabetes Maternal Grandmother   . Mental illness Maternal Grandmother   . Hypertension Maternal Grandfather   .  Diabetes Paternal Grandmother   . Heart disease Paternal Grandmother      Living Arrangements: Alone   Abuse/Neglect [facility]) Physical Abuse: Denies Verbal Abuse: Denies Sexual Abuse: Denies Allergies:   Allergies  Allergen Reactions  . Narcan [Naloxone] Other (See Comments) and Itching    Violent withdrawal symptoms; interferes with methadone Interferes with methadone and causes withdrawal   . Suboxone [Buprenorphine Hcl-Naloxone Hcl] Other (See Comments) and Itching    Violent withdrawal symptoms; interferes with  methadone Interferes with methadone and causes withdrawal      Objective: Blood pressure 94/51, pulse 61, temperature 98.5 F (36.9 C), temperature source Oral, resp. rate 12, height 5' 3"  (1.6 m), weight 174 lb 13.2 oz (79.3 kg), last menstrual period 02/13/2014, SpO2 98 %.Body mass index is 30.98 kg/(m^2). Results for orders placed or performed during the hospital encounter of 03/24/14 (from the past 72 hour(s))  GC/Chlamydia probe amp ([place])     Status: None   Collection Time: 03/24/14 12:00 AM  Result Value Ref Range   Chlamydia CT: Negative     Comment: Normal Reference Range - Negative   Neisseria gonorrhea NG: Negative     Comment: Normal Reference Range - Negative  CBC WITH DIFFERENTIAL     Status: Abnormal   Collection Time: 03/24/14 11:21 AM  Result Value Ref Range   WBC 5.8 4.0 - 10.5 K/uL   RBC 3.88 3.87 - 5.11 MIL/uL   Hemoglobin 11.7 (L) 12.0 - 15.0 g/dL   HCT 32.7 (L) 36.0 - 46.0 %   MCV 84.3 78.0 - 100.0 fL   MCH 30.2 26.0 - 34.0 pg   MCHC 35.8 30.0 - 36.0 g/dL   RDW 12.6 11.5 - 15.5 %   Platelets 221 150 - 400 K/uL   Neutrophils Relative % 70 43 - 77 %   Neutro Abs 4.1 1.7 - 7.7 K/uL   Lymphocytes Relative 24 12 - 46 %   Lymphs Abs 1.4 0.7 - 4.0 K/uL   Monocytes Relative 5 3 - 12 %   Monocytes Absolute 0.3 0.1 - 1.0 K/uL   Eosinophils Relative 1 0 - 5 %   Eosinophils Absolute 0.1 0.0 - 0.7 K/uL   Basophils Relative 0 0 - 1 %   Basophils Absolute 0.0 0.0 - 0.1 K/uL  Comprehensive metabolic panel     Status: None   Collection Time: 03/24/14 11:21 AM  Result Value Ref Range   Sodium 137 135 - 145 mmol/L    Comment: Please note change in reference range.   Potassium 3.5 3.5 - 5.1 mmol/L    Comment: Please note change in reference range.   Chloride 106 96 - 112 mEq/L   CO2 23 19 - 32 mmol/L   Glucose, Bld 88 70 - 99 mg/dL   BUN 15 6 - 23 mg/dL   Creatinine, Ser 0.65 0.50 - 1.10 mg/dL   Calcium 9.0 8.4 - 10.5 mg/dL   Total Protein 7.3 6.0 - 8.3  g/dL   Albumin 4.0 3.5 - 5.2 g/dL   AST 28 0 - 37 U/L   ALT 27 0 - 35 U/L   Alkaline Phosphatase 54 39 - 117 U/L   Total Bilirubin 0.4 0.3 - 1.2 mg/dL   GFR calc non Af Amer >90 >90 mL/min   GFR calc Af Amer >90 >90 mL/min    Comment: (NOTE) The eGFR has been calculated using the CKD EPI equation. This calculation has not been validated in all clinical situations. eGFR's  persistently <90 mL/min signify possible Chronic Kidney Disease.    Anion gap 8 5 - 15  Ethanol     Status: None   Collection Time: 03/24/14 11:22 AM  Result Value Ref Range   Alcohol, Ethyl (B) <5 0 - 9 mg/dL    Comment:        LOWEST DETECTABLE LIMIT FOR SERUM ALCOHOL IS 11 mg/dL FOR MEDICAL PURPOSES ONLY   Acetaminophen level     Status: Abnormal   Collection Time: 03/24/14 11:22 AM  Result Value Ref Range   Acetaminophen (Tylenol), Serum <10.0 (L) 10 - 30 ug/mL    Comment:        THERAPEUTIC CONCENTRATIONS VARY SIGNIFICANTLY. A RANGE OF 10-30 ug/mL MAY BE AN EFFECTIVE CONCENTRATION FOR MANY PATIENTS. HOWEVER, SOME ARE BEST TREATED AT CONCENTRATIONS OUTSIDE THIS RANGE. ACETAMINOPHEN CONCENTRATIONS >150 ug/mL AT 4 HOURS AFTER INGESTION AND >50 ug/mL AT 12 HOURS AFTER INGESTION ARE OFTEN ASSOCIATED WITH TOXIC REACTIONS.   Salicylate level     Status: None   Collection Time: 03/24/14 11:22 AM  Result Value Ref Range   Salicylate Lvl <8.5 2.8 - 20.0 mg/dL  Wet prep, genital     Status: Abnormal   Collection Time: 03/24/14 12:44 PM  Result Value Ref Range   Yeast Wet Prep HPF POC NONE SEEN NONE SEEN   Trich, Wet Prep NONE SEEN NONE SEEN   Clue Cells Wet Prep HPF POC MODERATE (A) NONE SEEN   WBC, Wet Prep HPF POC FEW (A) NONE SEEN  Drug screen panel, emergency     Status: Abnormal   Collection Time: 03/24/14  1:07 PM  Result Value Ref Range   Opiates NONE DETECTED NONE DETECTED   Cocaine POSITIVE (A) NONE DETECTED   Benzodiazepines POSITIVE (A) NONE DETECTED   Amphetamines NONE DETECTED NONE  DETECTED   Tetrahydrocannabinol POSITIVE (A) NONE DETECTED   Barbiturates NONE DETECTED NONE DETECTED    Comment:        DRUG SCREEN FOR MEDICAL PURPOSES ONLY.  IF CONFIRMATION IS NEEDED FOR ANY PURPOSE, NOTIFY LAB WITHIN 5 DAYS.        LOWEST DETECTABLE LIMITS FOR URINE DRUG SCREEN Drug Class       Cutoff (ng/mL) Amphetamine      1000 Barbiturate      200 Benzodiazepine   277 Tricyclics       824 Opiates          300 Cocaine          300 THC              50   Urinalysis, Routine w reflex microscopic     Status: Abnormal   Collection Time: 03/24/14  1:07 PM  Result Value Ref Range   Color, Urine YELLOW YELLOW   APPearance HAZY (A) CLEAR   Specific Gravity, Urine 1.025 1.005 - 1.030   pH 5.5 5.0 - 8.0   Glucose, UA NEGATIVE NEGATIVE mg/dL   Hgb urine dipstick NEGATIVE NEGATIVE   Bilirubin Urine NEGATIVE NEGATIVE   Ketones, ur NEGATIVE NEGATIVE mg/dL   Protein, ur NEGATIVE NEGATIVE mg/dL   Urobilinogen, UA 0.2 0.0 - 1.0 mg/dL   Nitrite NEGATIVE NEGATIVE   Leukocytes, UA NEGATIVE NEGATIVE    Comment: MICROSCOPIC NOT DONE ON URINES WITH NEGATIVE PROTEIN, BLOOD, LEUKOCYTES, NITRITE, OR GLUCOSE <1000 mg/dL.  POC urine preg, ED (not at [facility])     Status: None   Collection Time: 03/24/14  1:12 PM  Result Value Ref Range  Preg Test, Ur NEGATIVE NEGATIVE    Comment:        THE SENSITIVITY OF THIS METHODOLOGY IS >24 mIU/mL   CBG monitoring, ED     Status: Abnormal   Collection Time: 03/24/14  6:33 PM  Result Value Ref Range   Glucose-Capillary 106 (H) 70 - 99 mg/dL  MRSA PCR Screening     Status: None   Collection Time: 03/25/14  4:41 AM  Result Value Ref Range   MRSA by PCR NEGATIVE NEGATIVE    Comment:        The GeneXpert MRSA Assay (FDA approved for NASAL specimens only), is one component of a comprehensive MRSA colonization surveillance program. It is not intended to diagnose MRSA infection nor to guide or monitor treatment for MRSA infections.   Basic  metabolic panel     Status: None   Collection Time: 03/25/14 12:00 PM  Result Value Ref Range   Sodium 139 135 - 145 mmol/L    Comment: Please note change in reference range.   Potassium 4.0 3.5 - 5.1 mmol/L    Comment: Please note change in reference range.   Chloride 106 96 - 112 mEq/L   CO2 24 19 - 32 mmol/L   Glucose, Bld 95 70 - 99 mg/dL   BUN 17 6 - 23 mg/dL   Creatinine, Ser 0.60 0.50 - 1.10 mg/dL   Calcium 9.1 8.4 - 10.5 mg/dL   GFR calc non Af Amer >90 >90 mL/min   GFR calc Af Amer >90 >90 mL/min    Comment: (NOTE) The eGFR has been calculated using the CKD EPI equation. This calculation has not been validated in all clinical situations. eGFR's persistently <90 mL/min signify possible Chronic Kidney Disease.    Anion gap 9 5 - 15  CBC     Status: Abnormal   Collection Time: 03/25/14 12:00 PM  Result Value Ref Range   WBC 3.4 (L) 4.0 - 10.5 K/uL   RBC 3.76 (L) 3.87 - 5.11 MIL/uL   Hemoglobin 11.2 (L) 12.0 - 15.0 g/dL   HCT 32.0 (L) 36.0 - 46.0 %   MCV 85.1 78.0 - 100.0 fL   MCH 29.8 26.0 - 34.0 pg   MCHC 35.0 30.0 - 36.0 g/dL   RDW 12.6 11.5 - 15.5 %   Platelets 194 150 - 400 K/uL   Labs are reviewed and are pertinent for positive for cocaine and benzodiazepines and THC.  Current Facility-Administered Medications  Medication Dose Route Frequency Provider Last Rate Last Dose  . 0.9 %  sodium chloride infusion   Intravenous Continuous Theressa Millard, MD   Stopped at 03/25/14 0645  . acetaminophen (TYLENOL) tablet 650 mg  650 mg Oral Q6H PRN Theressa Millard, MD       Or  . acetaminophen (TYLENOL) suppository 650 mg  650 mg Rectal Q6H PRN Theressa Millard, MD      . acetaminophen (TYLENOL) tablet 650 mg  650 mg Oral Q4H PRN Mercedes Strupp Camprubi-Soms, PA-C      . ALPRAZolam Duanne Moron) tablet 3 mg  3 mg Oral Q6H Orlie Dakin, MD   3 mg at 03/25/14 1227  . alum & mag hydroxide-simeth (MAALOX/MYLANTA) 200-200-20 MG/5ML suspension 30 mL  30 mL Oral PRN Mercedes  Strupp Camprubi-Soms, PA-C      . alum & mag hydroxide-simeth (MAALOX/MYLANTA) 200-200-20 MG/5ML suspension 30 mL  30 mL Oral Q6H PRN Theressa Millard, MD      . celecoxib (CELEBREX) capsule  200 mg  200 mg Oral BID WC Mercedes Strupp Camprubi-Soms, PA-C   200 mg at 03/25/14 0926  . [START ON 03/26/2014] cloNIDine (CATAPRES - Dosed in mg/24 hr) patch 0.1 mg  0.1 mg Transdermal Q Sun Mercedes Strupp Camprubi-Soms, PA-C      . enoxaparin (LOVENOX) injection 40 mg  40 mg Subcutaneous Q24H Theressa Millard, MD   40 mg at 03/25/14 5176  . gabapentin (NEURONTIN) capsule 600 mg  600 mg Oral QID Mercedes Strupp Camprubi-Soms, PA-C   600 mg at 03/25/14 0926  . HYDROmorphone (DILAUDID) injection 0.5-1 mg  0.5-1 mg Intravenous Q3H PRN Theressa Millard, MD      . hydrOXYzine (ATARAX/VISTARIL) tablet 25 mg  25 mg Oral Q6H PRN Mercedes Strupp Camprubi-Soms, PA-C   25 mg at 03/25/14 1228  . ibuprofen (ADVIL,MOTRIN) tablet 600 mg  600 mg Oral Q8H PRN Mercedes Strupp Camprubi-Soms, PA-C      . lamoTRIgine (LAMICTAL) tablet 25 mg  25 mg Oral Daily Mercedes Strupp Camprubi-Soms, PA-C   25 mg at 03/25/14 0920  . LORazepam (ATIVAN) tablet 1 mg  1 mg Oral Q8H PRN Mercedes Strupp Camprubi-Soms, PA-C   1 mg at 03/25/14 0803  . meclizine (ANTIVERT) tablet 25 mg  25 mg Oral TID PRN Mercedes Strupp Camprubi-Soms, PA-C      . methadone (DOLOPHINE) tablet 40 mg  40 mg Oral Daily Theressa Millard, MD   40 mg at 03/25/14 1607  . metroNIDAZOLE (FLAGYL) tablet 500 mg  500 mg Oral Q12H Mercedes Strupp Camprubi-Soms, PA-C   500 mg at 03/25/14 0926  . mirtazapine (REMERON) tablet 7.5 mg  7.5 mg Oral QHS Mercedes Strupp Camprubi-Soms, PA-C   7.5 mg at 03/24/14 2341  . nicotine (NICODERM CQ - dosed in mg/24 hours) patch 21 mg  21 mg Transdermal Daily Mercedes Strupp Camprubi-Soms, PA-C   21 mg at 03/25/14 3710  . OLANZapine (ZYPREXA) tablet 15 mg  15 mg Oral QHS Mercedes Strupp Camprubi-Soms, PA-C   15 mg at 03/24/14 2340  .  ondansetron (ZOFRAN) tablet 4 mg  4 mg Oral Q6H PRN Theressa Millard, MD       Or  . ondansetron (ZOFRAN) injection 4 mg  4 mg Intravenous Q6H PRN Theressa Millard, MD      . ondansetron [facility]) tablet 4 mg  4 mg Oral Q8H PRN Mercedes Strupp Camprubi-Soms, PA-C   4 mg at 03/24/14 1949  . oxyCODONE (Oxy IR/ROXICODONE) immediate release tablet 5 mg  5 mg Oral Q4H PRN Theressa Millard, MD      . zolpidem (AMBIEN) tablet 5 mg  5 mg Oral QHS PRN Patty Sermons Camprubi-Soms, PA-C        Psychiatric Specialty Exam:     Blood pressure 94/51, pulse 61, temperature 98.5 F (36.9 C), temperature source Oral, resp. rate 12, height 5' 3"  (1.6 m), weight 174 lb 13.2 oz (79.3 kg), last menstrual period 02/13/2014, SpO2 98 %.Body mass index is 30.98 kg/(m^2).  General Appearance: Casual and Disheveled  Eye Contact::  Fair  Speech:  Pressured  Volume:  Normal  Mood:  Angry and Irritable  Affect:  Inappropriate and Labile  Thought Process:  Circumstantial  Orientation:  Full (Time, Place, and Person)  Thought Content:  Rumination  Suicidal Thoughts:  No  Homicidal Thoughts:  No  Memory:  Immediate;   Fair Recent;   Fair Remote;   Fair  Judgement:  Poor  Insight:  Lacking  Psychomotor Activity:  Normal  Concentration:  Fair  Recall:  [employer] of Knowledge:Poor  Language: Good  Akathisia:  No  Handed:  Right  AIMS (if indicated):     Assets:  Communication Skills Desire for Improvement Social Support  Sleep:      Musculoskeletal: Strength & Muscle Tone: within normal limits Gait & Station: normal Patient leans: N/A  Treatment Plan Summary: Daily contact with patient to assess and evaluate symptoms and progress in treatment Medication management Patient denies being suicidal and declines admission to the behavioral health hospital. She claims she now has an ACTT tean and a new psychiatrist. Obviously she cannot be discharged on 9 mg of Xanax per day so this needs to be slowly  decreased and clonazepam substituted. Will call attending to discuss Harrington Challenger [facility] 03/25/2014 4:44 PM

## 2014-03-25 NOTE — Progress Notes (Signed)
Pt refuses to place her oxygen saturation monitor on. Pt has sitter in room. Nurse examined pt. Pt is cooperating thus far with taking po medications.  Will monitor.

## 2014-03-26 MED ORDER — ALPRAZOLAM 0.5 MG PO TABS
0.5000 mg | ORAL_TABLET | Freq: Once | ORAL | Status: AC
  Administered 2014-03-26: 0.5 mg via ORAL
  Filled 2014-03-26: qty 1

## 2014-03-26 NOTE — Progress Notes (Signed)
Gave report to 5 west nurse. Pt is alert and oriented. Pt in no distress at this time. Nurse and nurse tech transferring pt to floor. Giving morning medications prior to taking pt.

## 2014-03-26 NOTE — Progress Notes (Signed)
Patient ID: [name]  female  MWU:[id number]    DOB: 01/29/1986    DOA: 03/24/2014  PCP: No primary care provider on file.  Brief history of present illness  Patient is a 29 year old female with polysubstance abuse presented with wanting detox treatment, suicidal ideation and having a plan, auditory hallucinations. Patient reported increased anxiety and stresses lately, no longer has Medicaid, was not able to get her prescribed Xanax associate was getting Xanax off the streets. She reported that she had been taking Xanax 1 mg tablet about 12 times a day for last 3 weeks. She also goes to methadone clinic and now down to 40 mg daily. She reported that she wanted to kill herself as she lost custody of her daughter who is now adopted. Patient was seen by EDP, was awaiting placement at behavioral health but suffered a tonic-clonic seizure witnessed by staff lasted for 1 minute. UDS positive for benzodiazepine, cocaine and THC. Patient was admitted for further workup.  Assessment/Plan: Principal Problem:   Benzodiazepine withdrawals - Psychiatry consulted, for now she is on Xanax, psychiatry consulted, recommended to decrease his Xanax slowly and clonazepam to be substituted. Will need transfer to [facility], currently medically stable. - Patient is asking if she can be discharged or she will leave AMA. Patient had withdrawal seizures, was taking Xanax 12 times a day off the streets, suicidal ideation with plan, behavior harmful towards herself, and hence, she cannot leave AMA, explained to her that she will be involuntary committed if she decides to leave AMA. For now patient has agreed and willing to comply with psychiatry disposition plan and recommendations.  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  Consultants: Psychiatry  Procedures: None  Antibiotics:  Rocephin, Zithromax, metronidazole    Subjective: Patient seen and examined, wondering if she can be discharged on xanax or she will leave AMA  Objective: Weight change:   Intake/Output Summary (Last 24 hours) at 03/26/14 0856 Last data filed at 03/26/14 0818  Gross per 24 hour  Intake    720 ml  Output      0 ml  Net    720 ml   Blood pressure 107/41, pulse 91, temperature 98.2 F (36.8 C), temperature source Oral, resp. rate 22, height  (1.6 m), weight 79.3 kg (174 lb 13.2 oz), last menstrual period 02/13/2014, SpO2 100 %.  Physical Exam: General: Alert and awake, oriented 3, NAD CVS: S1-S2 clear, no murmur rubs or gallops Chest: clear to auscultation bilaterally, no wheezing, rales or rhonchi Abdomen: soft nontender, nondistended, normal bowel sounds  Extremities: no cyanosis, clubbing or edema noted bilaterally, Slight tremors  Neuro: Cranial nerves II-XII intact, no focal neurological deficits  Lab Results: Basic Metabolic Panel:  Recent Labs Lab 03/24/14 1121 03/25/14 1200  NA 137 139  K 3.5 4.0  CL 106 106  CO2 23 24  GLUCOSE 88 95  BUN 15 17  CREATININE 0.65 0.60  CALCIUM 9.0 9.1   Liver Function Tests:  Recent Labs Lab 03/24/14 1121  AST 28  ALT 27  ALKPHOS 54  BILITOT 0.4  PROT 7.3  ALBUMIN 4.0   No results for input(s): LIPASE, AMYLASE in  the last 168 hours. No results for input(s): AMMONIA in the last 168 hours. CBC:  Recent Labs Lab 03/24/14 1121 03/25/14 1200  WBC 5.8 3.4*  NEUTROABS 4.1  --   HGB 11.7* 11.2*  HCT 32.7* 32.0*  MCV 84.3 85.1  PLT 221 194   Cardiac Enzymes: No results for input(s): CKTOTAL, CKMB, CKMBINDEX, TROPONINI in the last 168 hours. BNP: Invalid input(s): POCBNP CBG:  Recent Labs Lab 03/24/14 1833  GLUCAP 106*     Micro Results: Recent Results (from the past 240 hour(s))  Wet prep, genital     Status: Abnormal   Collection Time: 03/24/14 12:44 PM  Result Value Ref Range Status   Yeast Wet Prep HPF POC NONE SEEN NONE SEEN Final   Trich, Wet Prep NONE SEEN NONE SEEN Final   Clue Cells Wet Prep HPF POC MODERATE (A) NONE SEEN Final   WBC, Wet Prep HPF POC FEW (A) NONE SEEN Final  MRSA PCR Screening     Status: None   Collection Time: 03/25/14  4:41 AM  Result Value Ref Range Status   MRSA by PCR NEGATIVE NEGATIVE Final    Comment:        The GeneXpert MRSA Assay (FDA approved for NASAL specimens only), is one component of a comprehensive MRSA colonization surveillance program. It is not intended to diagnose MRSA infection nor to guide or monitor treatment for MRSA infections.     Studies/Results: [place] Transvaginal Non-ob  03/24/2014   CLINICAL DATA:  Vaginal discharge, pelvic pain. Concern for pelvic inflammatory disease or tubo-ovarian abscess.  EXAM: TRANSABDOMINAL AND TRANSVAGINAL ULTRASOUND OF PELVIS  DOPPLER ULTRASOUND OF OVARIES  TECHNIQUE: Both transabdominal and transvaginal ultrasound examinations of the pelvis were performed. Transabdominal technique was performed for global imaging of the pelvis including uterus, ovaries, adnexal regions, and pelvic cul-de-sac.  It was necessary to proceed with endovaginal exam following the transabdominal exam to visualize the uterus and ovaries. Color and duplex Doppler ultrasound was utilized to evaluate blood flow to the  ovaries.  COMPARISON:  CT 05/05/2006  FINDINGS: Uterus  Measurements: Normal in size at 10.0 x 5.0 x 6.4 cm.  Endometrium  Thickness: Normal in thickness for premenopausal female at 13 mm.  Right ovary  Measurements: Normal in size at 3.1 x 2.4 x 3.5 cm. Normal color Doppler flow and spectral waveforms.  Left ovary  Measurements: Normal in size at 2.9 x 3.1 x 2.6 cm. There is a dominant follicle measuring 2.2 cm. Normal color Doppler flow and spectral waveforms.  Pulsed Doppler evaluation of both ovaries demonstrates normal low-resistance arterial and venous waveforms.  Other findings  Small volume free fluid .  IMPRESSION: 1. Normal ovaries without evidence of torsion. 2. Normal uterus.   Electronically Signed   By: Genevive BiStewart  Edmunds M.D.   On: 03/24/2014 15:17   [place] Pelvis Complete  03/24/2014   CLINICAL DATA:  Vaginal discharge, pelvic pain. Concern for pelvic inflammatory disease or tubo-ovarian abscess.  EXAM: TRANSABDOMINAL AND TRANSVAGINAL ULTRASOUND OF PELVIS  DOPPLER ULTRASOUND OF OVARIES  TECHNIQUE: Both transabdominal and transvaginal ultrasound examinations of the pelvis were performed. Transabdominal technique was performed for global imaging of  the pelvis including uterus, ovaries, adnexal regions, and pelvic cul-de-sac.  It was necessary to proceed with endovaginal exam following the transabdominal exam to visualize the uterus and ovaries. Color and duplex Doppler ultrasound was utilized to evaluate blood flow to the ovaries.  COMPARISON:  CT 05/05/2006  FINDINGS: Uterus  Measurements: Normal in size at 10.0 x 5.0 x 6.4 cm.  Endometrium  Thickness: Normal in thickness for premenopausal female at 13 mm.  Right ovary  Measurements: Normal in size at 3.1 x 2.4 x 3.5 cm. Normal color Doppler flow and spectral waveforms.  Left ovary  Measurements: Normal in size at 2.9 x 3.1 x 2.6 cm. There is a dominant follicle measuring 2.2 cm. Normal color Doppler flow and spectral waveforms.  Pulsed Doppler  evaluation of both ovaries demonstrates normal low-resistance arterial and venous waveforms.  Other findings  Small volume free fluid .  IMPRESSION: 1. Normal ovaries without evidence of torsion. 2. Normal uterus.   Electronically Signed   By: Genevive Bi M.D.   On: 03/24/2014 15:17   [place] Art/ven Flow Abd Pelv Doppler  03/24/2014   CLINICAL DATA:  Vaginal discharge, pelvic pain. Concern for pelvic inflammatory disease or tubo-ovarian abscess.  EXAM: TRANSABDOMINAL AND TRANSVAGINAL ULTRASOUND OF PELVIS  DOPPLER ULTRASOUND OF OVARIES  TECHNIQUE: Both transabdominal and transvaginal ultrasound examinations of the pelvis were performed. Transabdominal technique was performed for global imaging of the pelvis including uterus, ovaries, adnexal regions, and pelvic cul-de-sac.  It was necessary to proceed with endovaginal exam following the transabdominal exam to visualize the uterus and ovaries. Color and duplex Doppler ultrasound was utilized to evaluate blood flow to the ovaries.  COMPARISON:  CT 05/05/2006  FINDINGS: Uterus  Measurements: Normal in size at 10.0 x 5.0 x 6.4 cm.  Endometrium  Thickness: Normal in thickness for premenopausal female at 13 mm.  Right ovary  Measurements: Normal in size at 3.1 x 2.4 x 3.5 cm. Normal color Doppler flow and spectral waveforms.  Left ovary  Measurements: Normal in size at 2.9 x 3.1 x 2.6 cm. There is a dominant follicle measuring 2.2 cm. Normal color Doppler flow and spectral waveforms.  Pulsed Doppler evaluation of both ovaries demonstrates normal low-resistance arterial and venous waveforms.  Other findings  Small volume free fluid .  IMPRESSION: 1. Normal ovaries without evidence of torsion. 2. Normal uterus.   Electronically Signed   By: Genevive Bi M.D.   On: 03/24/2014 15:17    Medications: Scheduled Meds: . ALPRAZolam  3 mg Oral Q6H  . celecoxib  200 mg Oral BID WC  . cloNIDine  0.1 mg Transdermal Q Sun  . enoxaparin (LOVENOX) injection  40 mg  Subcutaneous Q24H  . gabapentin  600 mg Oral QID  . lamoTRIgine  25 mg Oral Daily  . methadone  40 mg Oral Daily  . metroNIDAZOLE  500 mg Oral Q12H  . mirtazapine  7.5 mg Oral QHS  . nicotine  21 mg Transdermal Daily  . OLANZapine  15 mg Oral QHS   Time spent: 25 minutes   LOS: 2 days   RAI,RIPUDEEP M.D. [facility] 03/26/2014, 8:56 AM Pager: [phone]  If 7PM-7AM, please contact night-coverage [email or web address] Password [email or web address]

## 2014-03-26 NOTE — Progress Notes (Signed)
Ranelle Oystermanda D Bunner [id number]  Transfer Data: 03/26/2014 10:48 AM  Attending Provider: Cathren Harshipudeep K Rai, MD  PCP:No primary care provider on file.  Code Status: Full  Ranelle Oystermanda D Rens is a 29 y.o. female patient transferred from [facility]  -No acute distress noted.  -No complaints of shortness of breath.  -No complaints of chest pain.   Blood pressure 90/66, pulse 91, temperature 99.5 F (37.5 C), temperature source Oral, resp. rate 16, height 5\' 3"  (1.6 m), weight 79.3 kg (174 lb 13.2 oz), last menstrual period 02/13/2014, SpO2 100 %.  ?   Allergies: Narcan and Suboxone  Past Medical History:  has a past medical history of Drug abuse; GERD (gastroesophageal reflux disease); Depression; Arthritis; Seizures; Chronic pain; Methadone maintenance therapy patient; and Anxiety.  Past Surgical History:  has past surgical history that includes Appendectomy; Cesarean section; and Mandible fracture surgery.  Social History:  reports that she has been smoking.  She has never used smokeless tobacco. She reports that she drinks alcohol. She reports that she uses illicit drugs (Heroin, Marijuana, and Benzodiazepines).   Patient orientated to room.  Call light within reach. Patient able to voice and demonstrate understanding of unit orientation instructions.  Will continue to evaluate and treat per MD orders.

## 2014-03-26 NOTE — Progress Notes (Signed)
Attempted report to 5 west nurse not available

## 2014-03-26 NOTE — Progress Notes (Signed)
Nurse and nurse tech Psychiatrist(sitter) took pt to 5 west room 8. Sitter staying with pt. Pt's clothes and belongings given to Diplomatic Services operational officersecretary. Chart and medications given to Diplomatic Services operational officersecretary.

## 2014-03-26 NOTE — Consult Note (Signed)
[facility] Face-to-Face Psychiatry Consult   Reason for Consult:  Polysubstance abuse, suicidal ideation Referring Physician:  Dr. Thurmon Fair is an 29 y.o. female. Total Time spent with patient: 30 minutes  Assessment: AXIS I:  Substance Abuse and Substance Induced Mood Disorder AXIS II:  Cluster B Traits AXIS III:   Past Medical History  Diagnosis Date  . Drug abuse   . GERD (gastroesophageal reflux disease)   . Depression   . Arthritis   . Seizures   . Chronic pain   . Methadone maintenance therapy patient   . Anxiety    AXIS IV:  housing problems and other psychosocial or environmental problems AXIS V:  21-30 behavior considerably influenced by delusions or hallucinations OR serious impairment in judgment, communication OR inability to function in almost all areas  Plan:  Recommend psychiatric Inpatient admission when medically cleared.  Subjective:   [name] is a 29 y.o. female patient admitted with polysubstance abuse and seizure.  HPI:  This patient is a 29 year old single white female with a history of polysubstance abuse and multiple detox admissions who presented to the ED with complaints of wanting detox and having suicidal thoughts with a plan. She also admitted to auditory hallucinations. She claims she no longer was able to get her Xanax had been getting it off the street. She had been using Xanax 1 mg, 12 tabs a day for 3 weeks. She is also on methadone maintenance at 40 mg daily. On admission she claims she wanted to kill herself because she lost custody of her daughter and her daughter has now been adopted. She was awaiting admission to the behavioral health hospital when she had a seizure in the ED. Urine drug screen was positive for benzodiazepines cocaine and THC.  Now that she is admitted she is on alprazolam 3 mg every 6 hours. She feels better now and is no longer as anxious and denies suicidal ideation. She claims she has a plan to return to her  boyfriend's house and he will maintain all of her medicines and she wants to stay on Xanax. I told her she could not stand this high dosage of Xanax outside the hospital and she wants to be on Xanax 1 mg 4 times a day. Have again explained that she has to get to a lower dosage per day before she can be discharged and perhaps transition to clonazepam which is safer. She does not want to be hospitalized denies any thoughts or plans to harm herself denies auditory or visual hallucinations. She was just discharged from behavioral hospital approximately 2 weeks ago  Patient seen again on 03/26/14. She is much calmer "now that I have my Xanax." Denies being suicidal and denies auditory hallucinations. She feels that she can manage things on her own but i urged her to be patient so we can slowly bring down dose of Xanax to a more reasonable level like 1 mg qid. She is agreeable HPI Elements:   Location:  Global. Quality:  Severe. Severity:  Severe. Timing:  2 weeks. Duration:  Years. Context:  Recent adoption of daughter and overuse of Xanax.  Past Psychiatric History: Past Medical History  Diagnosis Date  . Drug abuse   . GERD (gastroesophageal reflux disease)   . Depression   . Arthritis   . Seizures   . Chronic pain   . Methadone maintenance therapy patient   . Anxiety     reports that she has been smoking.  She  has never used smokeless tobacco. She reports that she drinks alcohol. She reports that she uses illicit drugs (Heroin, Marijuana, and Benzodiazepines). Family History  Problem Relation Age of Onset  . Mental illness Mother   . Mental illness Brother   . Diabetes Maternal Grandmother   . Mental illness Maternal Grandmother   . Hypertension Maternal Grandfather   . Diabetes Paternal Grandmother   . Heart disease Paternal Grandmother      Living Arrangements: Alone   Abuse/Neglect [facility]) Physical Abuse: Denies Verbal Abuse: Denies Sexual Abuse: Denies Allergies:   Allergies   Allergen Reactions  . Narcan [Naloxone] Other (See Comments) and Itching    Violent withdrawal symptoms; interferes with methadone Interferes with methadone and causes withdrawal   . Suboxone [Buprenorphine Hcl-Naloxone Hcl] Other (See Comments) and Itching    Violent withdrawal symptoms; interferes with methadone Interferes with methadone and causes withdrawal      Objective: Blood pressure 93/37, pulse 81, temperature 97.8 F (36.6 C), temperature source Oral, resp. rate 17, height _0  (1.6 m), weight 174 lb 13.2 oz (79.3 kg), last menstrual period 02/13/2014, SpO2 99 %.Body mass index is 30.98 kg/(m^2). Results for orders placed or performed during the hospital encounter of 03/24/14 (from the past 72 hour(s))  GC/Chlamydia probe amp ([place])     Status: None   Collection Time: 03/24/14 12:00 AM  Result Value Ref Range   Chlamydia CT: Negative     Comment: Normal Reference Range - Negative   Neisseria gonorrhea NG: Negative     Comment: Normal Reference Range - Negative  CBC WITH DIFFERENTIAL     Status: Abnormal   Collection Time: 03/24/14 11:21 AM  Result Value Ref Range   WBC 5.8 4.0 - 10.5 K/uL   RBC 3.88 3.87 - 5.11 MIL/uL   Hemoglobin 11.7 (L) 12.0 - 15.0 g/dL   HCT 32.7 (L) 36.0 - 46.0 %   MCV 84.3 78.0 - 100.0 fL   MCH 30.2 26.0 - 34.0 pg   MCHC 35.8 30.0 - 36.0 g/dL   RDW 12.6 11.5 - 15.5 %   Platelets 221 150 - 400 K/uL   Neutrophils Relative % 70 43 - 77 %   Neutro Abs 4.1 1.7 - 7.7 K/uL   Lymphocytes Relative 24 12 - 46 %   Lymphs Abs 1.4 0.7 - 4.0 K/uL   Monocytes Relative 5 3 - 12 %   Monocytes Absolute 0.3 0.1 - 1.0 K/uL   Eosinophils Relative 1 0 - 5 %   Eosinophils Absolute 0.1 0.0 - 0.7 K/uL   Basophils Relative 0 0 - 1 %   Basophils Absolute 0.0 0.0 - 0.1 K/uL  Comprehensive metabolic panel     Status: None   Collection Time: 03/24/14 11:21 AM  Result Value Ref Range   Sodium 137 135 - 145 mmol/L    Comment: Please note change in reference  range.   Potassium 3.5 3.5 - 5.1 mmol/L    Comment: Please note change in reference range.   Chloride 106 96 - 112 mEq/L   CO2 23 19 - 32 mmol/L   Glucose, Bld 88 70 - 99 mg/dL   BUN 15 6 - 23 mg/dL   Creatinine, Ser 0.65 0.50 - 1.10 mg/dL   Calcium 9.0 8.4 - 10.5 mg/dL   Total Protein 7.3 6.0 - 8.3 g/dL   Albumin 4.0 3.5 - 5.2 g/dL   AST 28 0 - 37 U/L   ALT 27 0 - 35 U/L  Alkaline Phosphatase 54 39 - 117 U/L   Total Bilirubin 0.4 0.3 - 1.2 mg/dL   GFR calc non Af Amer >90 >90 mL/min   GFR calc Af Amer >90 >90 mL/min    Comment: (NOTE) The eGFR has been calculated using the CKD EPI equation. This calculation has not been validated in all clinical situations. eGFR's persistently <90 mL/min signify possible Chronic Kidney Disease.    Anion gap 8 5 - 15  Ethanol     Status: None   Collection Time: 03/24/14 11:22 AM  Result Value Ref Range   Alcohol, Ethyl (B) <5 0 - 9 mg/dL    Comment:        LOWEST DETECTABLE LIMIT FOR SERUM ALCOHOL IS 11 mg/dL FOR MEDICAL PURPOSES ONLY   Acetaminophen level     Status: Abnormal   Collection Time: 03/24/14 11:22 AM  Result Value Ref Range   Acetaminophen (Tylenol), Serum <10.0 (L) 10 - 30 ug/mL    Comment:        THERAPEUTIC CONCENTRATIONS VARY SIGNIFICANTLY. A RANGE OF 10-30 ug/mL MAY BE AN EFFECTIVE CONCENTRATION FOR MANY PATIENTS. HOWEVER, SOME ARE BEST TREATED AT CONCENTRATIONS OUTSIDE THIS RANGE. ACETAMINOPHEN CONCENTRATIONS >150 ug/mL AT 4 HOURS AFTER INGESTION AND >50 ug/mL AT 12 HOURS AFTER INGESTION ARE OFTEN ASSOCIATED WITH TOXIC REACTIONS.   Salicylate level     Status: None   Collection Time: 03/24/14 11:22 AM  Result Value Ref Range   Salicylate Lvl <7.2 2.8 - 20.0 mg/dL  Wet prep, genital     Status: Abnormal   Collection Time: 03/24/14 12:44 PM  Result Value Ref Range   Yeast Wet Prep HPF POC NONE SEEN NONE SEEN   Trich, Wet Prep NONE SEEN NONE SEEN   Clue Cells Wet Prep HPF POC MODERATE (A) NONE SEEN   WBC,  Wet Prep HPF POC FEW (A) NONE SEEN  Drug screen panel, emergency     Status: Abnormal   Collection Time: 03/24/14  1:07 PM  Result Value Ref Range   Opiates NONE DETECTED NONE DETECTED   Cocaine POSITIVE (A) NONE DETECTED   Benzodiazepines POSITIVE (A) NONE DETECTED   Amphetamines NONE DETECTED NONE DETECTED   Tetrahydrocannabinol POSITIVE (A) NONE DETECTED   Barbiturates NONE DETECTED NONE DETECTED    Comment:        DRUG SCREEN FOR MEDICAL PURPOSES ONLY.  IF CONFIRMATION IS NEEDED FOR ANY PURPOSE, NOTIFY LAB WITHIN 5 DAYS.        LOWEST DETECTABLE LIMITS FOR URINE DRUG SCREEN Drug Class       Cutoff (ng/mL) Amphetamine      1000 Barbiturate      200 Benzodiazepine   536 Tricyclics       644 Opiates          300 Cocaine          300 THC              50   Urinalysis, Routine w reflex microscopic     Status: Abnormal   Collection Time: 03/24/14  1:07 PM  Result Value Ref Range   Color, Urine YELLOW YELLOW   APPearance HAZY (A) CLEAR   Specific Gravity, Urine 1.025 1.005 - 1.030   pH 5.5 5.0 - 8.0   Glucose, UA NEGATIVE NEGATIVE mg/dL   Hgb urine dipstick NEGATIVE NEGATIVE   Bilirubin Urine NEGATIVE NEGATIVE   Ketones, ur NEGATIVE NEGATIVE mg/dL   Protein, ur NEGATIVE NEGATIVE mg/dL   Urobilinogen, UA 0.2 0.0 -  1.0 mg/dL   Nitrite NEGATIVE NEGATIVE   Leukocytes, UA NEGATIVE NEGATIVE    Comment: MICROSCOPIC NOT DONE ON URINES WITH NEGATIVE PROTEIN, BLOOD, LEUKOCYTES, NITRITE, OR GLUCOSE <1000 mg/dL.  POC urine preg, ED (not at [facility])     Status: None   Collection Time: 03/24/14  1:12 PM  Result Value Ref Range   Preg Test, Ur NEGATIVE NEGATIVE    Comment:        THE SENSITIVITY OF THIS METHODOLOGY IS >24 mIU/mL   CBG monitoring, ED     Status: Abnormal   Collection Time: 03/24/14  6:33 PM  Result Value Ref Range   Glucose-Capillary 106 (H) 70 - 99 mg/dL  MRSA PCR Screening     Status: None   Collection Time: 03/25/14  4:41 AM  Result Value Ref Range   MRSA by PCR  NEGATIVE NEGATIVE    Comment:        The GeneXpert MRSA Assay (FDA approved for NASAL specimens only), is one component of a comprehensive MRSA colonization surveillance program. It is not intended to diagnose MRSA infection nor to guide or monitor treatment for MRSA infections.   Basic metabolic panel     Status: None   Collection Time: 03/25/14 12:00 PM  Result Value Ref Range   Sodium 139 135 - 145 mmol/L    Comment: Please note change in reference range.   Potassium 4.0 3.5 - 5.1 mmol/L    Comment: Please note change in reference range.   Chloride 106 96 - 112 mEq/L   CO2 24 19 - 32 mmol/L   Glucose, Bld 95 70 - 99 mg/dL   BUN 17 6 - 23 mg/dL   Creatinine, Ser 0.60 0.50 - 1.10 mg/dL   Calcium 9.1 8.4 - 10.5 mg/dL   GFR calc non Af Amer >90 >90 mL/min   GFR calc Af Amer >90 >90 mL/min    Comment: (NOTE) The eGFR has been calculated using the CKD EPI equation. This calculation has not been validated in all clinical situations. eGFR's persistently <90 mL/min signify possible Chronic Kidney Disease.    Anion gap 9 5 - 15  CBC     Status: Abnormal   Collection Time: 03/25/14 12:00 PM  Result Value Ref Range   WBC 3.4 (L) 4.0 - 10.5 K/uL   RBC 3.76 (L) 3.87 - 5.11 MIL/uL   Hemoglobin 11.2 (L) 12.0 - 15.0 g/dL   HCT 32.0 (L) 36.0 - 46.0 %   MCV 85.1 78.0 - 100.0 fL   MCH 29.8 26.0 - 34.0 pg   MCHC 35.0 30.0 - 36.0 g/dL   RDW 12.6 11.5 - 15.5 %   Platelets 194 150 - 400 K/uL   Labs are reviewed and are pertinent for positive for cocaine and benzodiazepines and THC.  Current Facility-Administered Medications  Medication Dose Route Frequency Provider Last Rate Last Dose  . acetaminophen (TYLENOL) tablet 650 mg  650 mg Oral Q6H PRN Theressa Millard, MD       Or  . acetaminophen (TYLENOL) suppository 650 mg  650 mg Rectal Q6H PRN Theressa Millard, MD      . acetaminophen (TYLENOL) tablet 650 mg  650 mg Oral Q4H PRN Mercedes Strupp Camprubi-Soms, PA-C      .  ALPRAZolam Duanne Moron) tablet 3 mg  3 mg Oral Q6H Orlie Dakin, MD   3 mg at 03/26/14 1709  . alum & mag hydroxide-simeth (MAALOX/MYLANTA) 200-200-20 MG/5ML suspension 30 mL  30 mL Oral PRN  Mercedes Strupp Camprubi-Soms, PA-C      . alum & mag hydroxide-simeth (MAALOX/MYLANTA) 200-200-20 MG/5ML suspension 30 mL  30 mL Oral Q6H PRN Theressa Millard, MD      . celecoxib (CELEBREX) capsule 200 mg  200 mg Oral BID WC Mercedes Strupp Camprubi-Soms, PA-C   200 mg at 03/26/14 1709  . cloNIDine (CATAPRES - Dosed in mg/24 hr) patch 0.1 mg  0.1 mg Transdermal Q Sun Mercedes Strupp Camprubi-Soms, PA-C   0.1 mg at 03/26/14 0941  . enoxaparin (LOVENOX) injection 40 mg  40 mg Subcutaneous Q24H Theressa Millard, MD   40 mg at 03/26/14 0946  . gabapentin (NEURONTIN) capsule 600 mg  600 mg Oral QID Mercedes Strupp Camprubi-Soms, PA-C   600 mg at 03/26/14 1709  . HYDROmorphone (DILAUDID) injection 0.5-1 mg  0.5-1 mg Intravenous Q3H PRN Theressa Millard, MD      . hydrOXYzine (ATARAX/VISTARIL) tablet 25 mg  25 mg Oral Q6H PRN Mercedes Strupp Camprubi-Soms, PA-C   25 mg at 03/26/14 1255  . ibuprofen (ADVIL,MOTRIN) tablet 600 mg  600 mg Oral Q8H PRN Mercedes Strupp Camprubi-Soms, PA-C   600 mg at 03/26/14 0753  . lamoTRIgine (LAMICTAL) tablet 25 mg  25 mg Oral Daily Mercedes Strupp Camprubi-Soms, PA-C   25 mg at 03/26/14 0940  . LORazepam (ATIVAN) tablet 1 mg  1 mg Oral Q8H PRN Mercedes Strupp Camprubi-Soms, PA-C   1 mg at 03/26/14 1521  . meclizine (ANTIVERT) tablet 25 mg  25 mg Oral TID PRN Mercedes Strupp Camprubi-Soms, PA-C   25 mg at 03/26/14 0941  . methadone (DOLOPHINE) tablet 40 mg  40 mg Oral Daily Theressa Millard, MD   40 mg at 03/26/14 6294  . metroNIDAZOLE (FLAGYL) tablet 500 mg  500 mg Oral Q12H Mercedes Strupp Camprubi-Soms, PA-C   500 mg at 03/26/14 0940  . mirtazapine (REMERON) tablet 7.5 mg  7.5 mg Oral QHS Mercedes Strupp Camprubi-Soms, PA-C   7.5 mg at 03/25/14 2312  . nicotine (NICODERM CQ -  dosed in mg/24 hours) patch 21 mg  21 mg Transdermal Daily Mercedes Strupp Camprubi-Soms, PA-C   21 mg at 03/26/14 0942  . OLANZapine (ZYPREXA) tablet 15 mg  15 mg Oral QHS Mercedes Strupp Camprubi-Soms, PA-C   15 mg at 03/25/14 2312  . ondansetron (ZOFRAN) tablet 4 mg  4 mg Oral Q6H PRN Theressa Millard, MD       Or  . ondansetron (ZOFRAN) injection 4 mg  4 mg Intravenous Q6H PRN Theressa Millard, MD      . ondansetron [facility]) tablet 4 mg  4 mg Oral Q8H PRN Mercedes Strupp Camprubi-Soms, PA-C   4 mg at 03/24/14 1949  . oxyCODONE (Oxy IR/ROXICODONE) immediate release tablet 5 mg  5 mg Oral Q4H PRN Theressa Millard, MD      . zolpidem (AMBIEN) tablet 5 mg  5 mg Oral QHS PRN Patty Sermons Camprubi-Soms, PA-C        Psychiatric Specialty Exam:     Blood pressure 93/37, pulse 81, temperature 97.8 F (36.6 C), temperature source Oral, resp. rate 17, height _0  (1.6 m), weight 174 lb 13.2 oz (79.3 kg), last menstrual period 02/13/2014, SpO2 99 %.Body mass index is 30.98 kg/(m^2).  General Appearance: Casual and Disheveled  Eye Contact::  Fair  Speech:  Pressured  Volume:  Normal  Mood:  Fairly good  Affect:  brighter  Thought Process:  wnls  Orientation:  Full (Time, Place, and Person)  Thought Content:  Rumination  Suicidal Thoughts:  No  Homicidal Thoughts:  No  Memory:  Immediate;   Fair Recent;   Fair Remote;   Fair  Judgement:  Poor  Insight:  Lacking  Psychomotor Activity:  Normal  Concentration:  Fair  Recall:  [employer] of Knowledge:Poor  Language: Good  Akathisia:  No  Handed:  Right  AIMS (if indicated):     Assets:  Communication Skills Desire for Improvement Social Support  Sleep:      Musculoskeletal: Strength & Muscle Tone: within normal limits Gait & Station: normal Patient leans: N/A  Treatment Plan Summary: Daily contact with patient to assess and evaluate symptoms and progress in treatment Medication management Patient denies being suicidal  and declines admission to the behavioral health hospital. She claims she now has an ACTT tean and a new psychiatrist. Obviously she cannot be discharged on 9 mg of Xanax per day so this needs to be slowly decreased and clonazepam substituted.  Recommend slowly wean down xanax, try to get to more reasonalble level like 1 mg qid. Will ask Psychiatry to follow through the week ROSS, [facility] 03/26/2014 5:42 PM

## 2014-03-26 NOTE — Progress Notes (Signed)
Pt has orders to go to [facility]. Called to give report to nurse but nurse unavailable. Left number for [facility] nurse to call back. Pt is resting in room and waiting for breakfast.

## 2014-03-27 MED ORDER — ALPRAZOLAM 0.5 MG PO TABS
2.0000 mg | ORAL_TABLET | Freq: Four times a day (QID) | ORAL | Status: DC
  Administered 2014-03-27 – 2014-03-28 (×5): 2 mg via ORAL
  Filled 2014-03-27 (×5): qty 4

## 2014-03-27 MED ORDER — CLONIDINE HCL 0.1 MG/24HR TD PTWK
0.1000 mg | MEDICATED_PATCH | TRANSDERMAL | Status: DC
  Administered 2014-03-27: 0.1 mg via TRANSDERMAL
  Filled 2014-03-27: qty 1

## 2014-03-27 NOTE — Clinical Social Work Psych Note (Signed)
Psych CSW was notified of patient's wishes to leave against medical advice (AMA).  Patient currently has an inpatient psychiatric recommendation by psychiatry and cannot contract for safety.  Psych CSW completed Involuntary Commitment (IVC) paperwork.  Patient is currently IVC'd and should not leave the hospital without having such paperwork rescinded.  Expiration: 04/02/2014  Vickii PennaGina Debara Kamphuis, LCSWA [phone]  Psychiatric & Orthopedics (5N 1-16) Clinical Social Worker

## 2014-03-27 NOTE — Care Management Note (Signed)
    Page 1 of 1   03/30/2014     4:16:52 PM CARE MANAGEMENT NOTE 03/30/2014  Patient:  [name],[name]   Account Number:  [id number]  Date Initiated:  03/27/2014  Documentation initiated by:  Letha CapeAYLOR,Kolbi Altadonna  Subjective/Objective Assessment:   dx suicidal ideation  admit- lives alone.     Action/Plan:   [facility]   Anticipated DC Date:  03/30/2014   Anticipated DC Plan:  HOME/SELF CARE  In-house referral  Clinical Social Worker      DC Planning Services  CM consult      Choice offered to / List presented to:             Status of service:  Completed, signed off Medicare Important Message given?  NO (If response is "NO", the following Medicare IM given date fields will be blank) Date Medicare IM given:   Medicare IM given by:   Date Additional Medicare IM given:   Additional Medicare IM given by:    Discharge Disposition:  HOME/SELF CARE  Per UR Regulation:  Reviewed for med. necessity/level of care/duration of stay  If discussed at Long Length of Stay Meetings, dates discussed:    Comments:  03/30/14 1614 Letha Capeeborah Yoon Barca RN ,BSN [phone] [id number] per CSW Psych states that patient can be dc to home.  NCM scheduled hosp f/u with [facility].  03/27/14 1720 Letha Capeeborah Jonia Oakey RN, BSN [phone] patient has been IVC'Gould. Psych CSW following.

## 2014-03-27 NOTE — Progress Notes (Signed)
RN paged secondary to pt with low BP 89/40, asymptomatic. Pt with order for Xanax 3mg  PO. RN states pt with no IV access, unsure why. Advised to hold Xanax given low BP, and reattempt IV, to give fluid bolus  Illa LevelSahar Aideen Fenster [facility]

## 2014-03-27 NOTE — Progress Notes (Signed)
On-call provider notified of pt's BP 89/40 with orders to give Xanax 3 mg PO now. Will await MD instruction before administration given low BP. Will continue to monitor.

## 2014-03-27 NOTE — Progress Notes (Signed)
On-call provider notified of patient's BP 152/118. Patient stated Clonidine patch had fallen off and was not able to re-stick to skin. Patch observed to be sitting on table. Patient anxious, RN gave pt PRN dose of Ativan and reassured patient that her BP would be treated and instructed patient to try to relax and lay on left side. Will continue to monitor.

## 2014-03-27 NOTE — Progress Notes (Signed)
Patient ID: [name]  female  ZOX:[id number]    DOB: August 25, 1985    DOA: 03/24/2014  PCP: No primary care provider on file.  Brief history of present illness  Patient is a 29 year old female with polysubstance abuse presented with wanting detox treatment, suicidal ideation and having a plan, auditory hallucinations. Patient reported increased anxiety and stresses lately, no longer has Medicaid, was not able to get her prescribed Xanax associate was getting Xanax off the streets. She reported that she had been taking Xanax 1 mg tablet about 12 times a day for last 3 weeks. She also goes to methadone clinic and now down to 40 mg daily. She reported that she wanted to kill herself as she lost custody of her daughter who is now adopted. Patient was seen by EDP, was awaiting placement at behavioral health but suffered a tonic-clonic seizure witnessed by staff lasted for 1 minute. UDS positive for benzodiazepine, cocaine and THC. Patient was admitted for further workup.  Assessment/Plan: Principal Problem:   Benzodiazepine withdrawals - Psychiatry consulted, recommended to taper and wean xanax slowly and clonazepam to be substituted once down  qid - Patient seen and examined today, threatening to leave AMA, yelling and crying and asking for Xanax. Xanax was held overnight due to low BP. -  Patient had withdrawal seizures, was taking Xanax 12 times a day off the streets, suicidal ideation with plan, behavior harmful towards herself, and hence, she cannot leave AMA, explained to her that she will be involuntary committed if she decides to leave AMA. Will IVC today. - Tapered Xanax to 2 mg every 6 hours. Discussed with psych social worker, she is medically clear from seizure standpoint. Her seizure was due to benzodiazepine withdrawals.   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 mg daily  Presumptive cervicitis and bacterial vaginosis - Administered a single doses of Rocephin, azithromycin and metronidazole  DVT Prophylaxis: Lovenox  Code Status: FC  Family Communication:  Disposition: Awaiting psych disposition, she is medically clear, she needs weaning and tapering off Xanax which [facility]  Consultants: Psychiatry  Procedures: None  Antibiotics:  Rocephin, Zithromax, metronidazole    Subjective: Patient seen and examined, yelling and crying loudly, asking for Xanax, threatening to leave  Objective: Weight change:   Intake/Output Summary (Last 24 hours) at 03/27/14 0958 Last data filed at 03/27/14 0926  Gross per 24 hour  Intake    900 ml  Output      0 ml  Net    900 ml   Blood pressure 126/73, pulse 80, temperature 98.4 F (36.9 C), temperature source Oral, resp. rate 18, height  (1.6 m), weight 79.3 kg (174 lb 13.2 oz), last menstrual period 02/13/2014, SpO2 97 %.  Physical Exam: General: Alert and awake, oriented 3, agitated CVS: S1-S2 clear Chest: CTAB Abdomen: soft NT, ND, NBS Extremities: no cyanosis, clubbing or edema noted bilaterally   Lab Results: Basic Metabolic Panel:  Recent Labs Lab 03/24/14 1121 03/25/14 1200  NA 137 139  K 3.5 4.0  CL 106 106  CO2 23  24  GLUCOSE 88 95  BUN 15 17  CREATININE 0.65 0.60  CALCIUM 9.0 9.1   Liver Function Tests:  Recent Labs Lab 03/24/14 1121  AST 28  ALT 27  ALKPHOS 54  BILITOT 0.4  PROT 7.3  ALBUMIN 4.0   No results for input(s): LIPASE, AMYLASE in the last 168 hours. No results for input(s): AMMONIA in the last 168 hours. CBC:  Recent Labs Lab  03/24/14 1121 03/25/14 1200  WBC 5.8 3.4*  NEUTROABS 4.1  --   HGB 11.7* 11.2*  HCT 32.7* 32.0*  MCV 84.3 85.1  PLT 221 194   Cardiac Enzymes: No results for input(s): CKTOTAL, CKMB, CKMBINDEX, TROPONINI in the last 168 hours. BNP: Invalid input(s): POCBNP CBG:  Recent Labs Lab 03/24/14 1833  GLUCAP 106*     Micro Results: Recent Results (from the past 240 hour(s))  Wet prep, genital     Status: Abnormal   Collection Time: 03/24/14 12:44 PM  Result Value Ref Range Status   Yeast Wet Prep HPF POC NONE SEEN NONE SEEN Final   Trich, Wet Prep NONE SEEN NONE SEEN Final   Clue Cells Wet Prep HPF POC MODERATE (A) NONE SEEN Final   WBC, Wet Prep HPF POC FEW (A) NONE SEEN Final  MRSA PCR Screening     Status: None   Collection Time: 03/25/14  4:41 AM  Result Value Ref Range Status   MRSA by PCR NEGATIVE NEGATIVE Final    Comment:        The GeneXpert MRSA Assay (FDA approved for NASAL specimens only), is one component of a comprehensive MRSA colonization surveillance program. It is not intended to diagnose MRSA infection nor to guide or monitor treatment for MRSA infections.     Studies/Results: [place] Transvaginal Non-ob  03/24/2014   CLINICAL DATA:  Vaginal discharge, pelvic pain. Concern for pelvic inflammatory disease or tubo-ovarian abscess.  EXAM: TRANSABDOMINAL AND TRANSVAGINAL ULTRASOUND OF PELVIS  DOPPLER ULTRASOUND OF OVARIES  TECHNIQUE: Both transabdominal and transvaginal ultrasound examinations of the pelvis were performed. Transabdominal technique was performed for global imaging of the pelvis including uterus, ovaries, adnexal regions, and pelvic cul-de-sac.  It was necessary to proceed with endovaginal exam following the transabdominal exam to visualize the uterus and ovaries. Color and duplex Doppler ultrasound was utilized to evaluate blood flow to the ovaries.  COMPARISON:  CT 05/05/2006  FINDINGS: Uterus  Measurements: Normal in size at 10.0 x 5.0 x 6.4 cm.   Endometrium  Thickness: Normal in thickness for premenopausal female at 13 mm.  Right ovary  Measurements: Normal in size at 3.1 x 2.4 x 3.5 cm. Normal color Doppler flow and spectral waveforms.  Left ovary  Measurements: Normal in size at 2.9 x 3.1 x 2.6 cm. There is a dominant follicle measuring 2.2 cm. Normal color Doppler flow and spectral waveforms.  Pulsed Doppler evaluation of both ovaries demonstrates normal low-resistance arterial and venous waveforms.  Other findings  Small volume free fluid .  IMPRESSION: 1. Normal ovaries without evidence of torsion. 2. Normal uterus.   Electronically Signed   By: Genevive BiStewart  Edmunds M.D.   On: 03/24/2014 15:17   [place] Pelvis Complete  03/24/2014   CLINICAL DATA:  Vaginal discharge, pelvic pain. Concern for pelvic inflammatory disease or tubo-ovarian abscess.  EXAM: TRANSABDOMINAL AND TRANSVAGINAL ULTRASOUND OF PELVIS  DOPPLER ULTRASOUND OF OVARIES  TECHNIQUE: Both transabdominal and transvaginal ultrasound examinations of the pelvis were performed. Transabdominal technique was performed for global imaging of the pelvis  including uterus, ovaries, adnexal regions, and pelvic cul-de-sac.  It was necessary to proceed with endovaginal exam following the transabdominal exam to visualize the uterus and ovaries. Color and duplex Doppler ultrasound was utilized to evaluate blood flow to the ovaries.  COMPARISON:  CT 05/05/2006  FINDINGS: Uterus  Measurements: Normal in size at 10.0 x 5.0 x 6.4 cm.  Endometrium  Thickness: Normal in thickness for premenopausal female at 13 mm.  Right ovary  Measurements: Normal in size at 3.1 x 2.4 x 3.5 cm. Normal color Doppler flow and spectral waveforms.  Left ovary  Measurements: Normal in size at 2.9 x 3.1 x 2.6 cm. There is a dominant follicle measuring 2.2 cm. Normal color Doppler flow and spectral waveforms.  Pulsed Doppler evaluation of both ovaries demonstrates normal low-resistance arterial and venous waveforms.  Other findings  Small  volume free fluid .  IMPRESSION: 1. Normal ovaries without evidence of torsion. 2. Normal uterus.   Electronically Signed   By: Genevive Bi M.D.   On: 03/24/2014 15:17   [place] Art/ven Flow Abd Pelv Doppler  03/24/2014   CLINICAL DATA:  Vaginal discharge, pelvic pain. Concern for pelvic inflammatory disease or tubo-ovarian abscess.  EXAM: TRANSABDOMINAL AND TRANSVAGINAL ULTRASOUND OF PELVIS  DOPPLER ULTRASOUND OF OVARIES  TECHNIQUE: Both transabdominal and transvaginal ultrasound examinations of the pelvis were performed. Transabdominal technique was performed for global imaging of the pelvis including uterus, ovaries, adnexal regions, and pelvic cul-de-sac.  It was necessary to proceed with endovaginal exam following the transabdominal exam to visualize the uterus and ovaries. Color and duplex Doppler ultrasound was utilized to evaluate blood flow to the ovaries.  COMPARISON:  CT 05/05/2006  FINDINGS: Uterus  Measurements: Normal in size at 10.0 x 5.0 x 6.4 cm.  Endometrium  Thickness: Normal in thickness for premenopausal female at 13 mm.  Right ovary  Measurements: Normal in size at 3.1 x 2.4 x 3.5 cm. Normal color Doppler flow and spectral waveforms.  Left ovary  Measurements: Normal in size at 2.9 x 3.1 x 2.6 cm. There is a dominant follicle measuring 2.2 cm. Normal color Doppler flow and spectral waveforms.  Pulsed Doppler evaluation of both ovaries demonstrates normal low-resistance arterial and venous waveforms.  Other findings  Small volume free fluid .  IMPRESSION: 1. Normal ovaries without evidence of torsion. 2. Normal uterus.   Electronically Signed   By: Genevive Bi M.D.   On: 03/24/2014 15:17    Medications: Scheduled Meds: . ALPRAZolam  2 mg Oral Q6H  . celecoxib  200 mg Oral BID WC  . cloNIDine  0.1 mg Transdermal Q Sun  . enoxaparin (LOVENOX) injection  40 mg Subcutaneous Q24H  . gabapentin  600 mg Oral QID  . lamoTRIgine  25 mg Oral Daily  . metroNIDAZOLE  500 mg Oral Q12H  .  mirtazapine  7.5 mg Oral QHS  . nicotine  21 mg Transdermal Daily  . OLANZapine  15 mg Oral QHS   Time spent: 25 minutes   LOS: 3 days   Arti Trang M.D. [facility] 03/27/2014, 9:58 AM Pager: [phone]  If 7PM-7AM, please contact night-coverage [email or web address] Password [email or web address]

## 2014-03-27 NOTE — BH Assessment (Signed)
Per shift report at 7:00 am today, [facility] once pt medically cleared. Writer spoke w/ Thurman CoyerEric Kaplan [facility] just now who [facility] and [facility]. Writer updated shift report to reflect change.   Evette Cristalaroline Paige Quantavia Frith, [place] Assessment Counselor

## 2014-03-28 MED ORDER — FLUCONAZOLE 150 MG PO TABS
150.0000 mg | ORAL_TABLET | Freq: Once | ORAL | Status: AC
  Administered 2014-03-28: 150 mg via ORAL
  Filled 2014-03-28: qty 1

## 2014-03-28 MED ORDER — METHADONE HCL 10 MG PO TABS: 40.0000 mg | ORAL_TABLET | Freq: Every day | ORAL | Status: DC

## 2014-03-28 MED ORDER — METHADONE HCL 10 MG PO TABS
40.0000 mg | ORAL_TABLET | Freq: Every day | ORAL | Status: DC
  Administered 2014-03-28 – 2014-03-30 (×3): 40 mg via ORAL
  Filled 2014-03-28 (×3): qty 4

## 2014-03-28 MED ORDER — ALPRAZOLAM 0.5 MG PO TABS
1.0000 mg | ORAL_TABLET | Freq: Four times a day (QID) | ORAL | Status: DC
  Administered 2014-03-28 – 2014-03-29 (×3): 1 mg via ORAL
  Filled 2014-03-28 (×3): qty 2

## 2014-03-28 MED ORDER — DOCUSATE SODIUM 100 MG PO CAPS: 200.0000 mg | ORAL_CAPSULE | Freq: Every day | ORAL | Status: DC | PRN

## 2014-03-28 MED ORDER — POLYETHYLENE GLYCOL 3350 17 G PO PACK
17.0000 g | PACK | Freq: Every day | ORAL | Status: DC | PRN
  Filled 2014-03-28: qty 1

## 2014-03-28 NOTE — Progress Notes (Signed)
Patient ID: [name]  female  ZOX:[id number]    DOB: Sep 02, 1985    DOA: 03/24/2014  PCP: No primary care provider on file.  Brief history of present illness  Patient is a 29 year old female with polysubstance abuse presented with wanting detox treatment, suicidal ideation and having a plan, auditory hallucinations. Patient reported increased anxiety and stresses lately, no longer has Medicaid, was not able to get her prescribed Xanax associate was getting Xanax off the streets. She reported that she had been taking Xanax 1 mg tablet about 12 times a day for last 3 weeks. She also goes to methadone clinic and now down to 40 mg daily. She reported that she wanted to kill herself as she lost custody of her daughter who is now adopted. Patient was seen by EDP, was awaiting placement at behavioral health but suffered a tonic-clonic seizure witnessed by staff lasted for 1 minute. UDS positive for benzodiazepine, cocaine and THC. Patient was admitted for further workup.  Assessment/Plan: Principal Problem:   Benzodiazepine withdrawals - Psychiatry consulted, recommended to taper and wean xanax slowly and clonazepam to be substituted once down  qid -  Patient had withdrawal seizures, was taking Xanax 12 times a day off the streets, suicidal ideation with plan, behavior harmful towards herself, and hence, she cannot leave AMA (has been threatening to leave AMA every day). Patient has been involuntary committed since 03/27/58 - Tapered Xanax to 2 mg every 6 hours. Discussed with psych social worker, she is medically clear from seizure standpoint. Her seizure was due to benzodiazepine withdrawals. Psychiatry to follow up today for any further recommendations.  Active Problems:   Withdrawal seizures - Placed back on Xanax scheduled for now to avoid withdrawals, Should be tapered slowly to avoid withdrawal seizures - Continue oral Ativan as needed for anxiety/seizure    Polysubstance abuse - UDS  positive for benzodiazepines, cocaine, THC    Depression, Suicidal ideation - At this time, patient denies any active suicidal ideation, psychiatry to decide if sitter needs to be continued    Hepatitis C - Chronic issue    Methadone maintenance therapy patient - Continue methadone 40 mg daily, patient agitated about her methadone dose missed this morning, states that she has been on methadone for 11 years.  Presumptive cervicitis and bacterial vaginosis - Administered a single doses of Rocephin, azithromycin and metronidazole  DVT Prophylaxis: Lovenox  Code Status: FC  Family Communication:  Disposition: Awaiting psych disposition, she is medically clear, she needs weaning and tapering off Xanax which [facility] be [facility]  Consultants: Psychiatry  Procedures: None  Antibiotics:  Rocephin, Zithromax, metronidazole    Subjective: Patient seen and examined, agitated over methadone dose, sitter at the bedside  Objective: Weight change:   Intake/Output Summary (Last 24 hours) at 03/28/14 1141 Last data filed at 03/28/14 0924  Gross per 24 hour  Intake   1060 ml  Output      0 ml  Net   1060 ml   Blood pressure 108/67, pulse 98, temperature 98.4 F (36.9 C), temperature source Oral, resp. rate 18, height  (1.6 m), weight 79.3 kg (174 lb 13.2 oz), last menstrual period 02/13/2014, SpO2 100 %.  Physical Exam: General: Alert and awake, oriented 3, agitated CVS: S1-S2 clear Chest: CTAB Abdomen: soft NT, ND, NBS Extremities: no c/c/e b/l   Lab Results: Basic Metabolic Panel:  Recent Labs Lab 03/24/14 1121 03/25/14 1200  NA 137 139  K 3.5 4.0  CL  106 106  CO2 23 24  GLUCOSE 88 95  BUN 15 17  CREATININE 0.65 0.60  CALCIUM 9.0 9.1   Liver Function Tests:  Recent Labs Lab 03/24/14 1121  AST 28  ALT 27  ALKPHOS 54  BILITOT 0.4  PROT 7.3  ALBUMIN 4.0   No results for input(s): LIPASE, AMYLASE in the last 168 hours. No results for input(s):  AMMONIA in the last 168 hours. CBC:  Recent Labs Lab 03/24/14 1121 03/25/14 1200  WBC 5.8 3.4*  NEUTROABS 4.1  --   HGB 11.7* 11.2*  HCT 32.7* 32.0*  MCV 84.3 85.1  PLT 221 194   Cardiac Enzymes: No results for input(s): CKTOTAL, CKMB, CKMBINDEX, TROPONINI in the last 168 hours. BNP: Invalid input(s): POCBNP CBG:  Recent Labs Lab 03/24/14 1833  GLUCAP 106*     Micro Results: Recent Results (from the past 240 hour(s))  Wet prep, genital     Status: Abnormal   Collection Time: 03/24/14 12:44 PM  Result Value Ref Range Status   Yeast Wet Prep HPF POC NONE SEEN NONE SEEN Final   Trich, Wet Prep NONE SEEN NONE SEEN Final   Clue Cells Wet Prep HPF POC MODERATE (A) NONE SEEN Final   WBC, Wet Prep HPF POC FEW (A) NONE SEEN Final  MRSA PCR Screening     Status: None   Collection Time: 03/25/14  4:41 AM  Result Value Ref Range Status   MRSA by PCR NEGATIVE NEGATIVE Final    Comment:        The GeneXpert MRSA Assay (FDA approved for NASAL specimens only), is one component of a comprehensive MRSA colonization surveillance program. It is not intended to diagnose MRSA infection nor to guide or monitor treatment for MRSA infections.     Studies/Results: [place] Transvaginal Non-ob  03/24/2014   CLINICAL DATA:  Vaginal discharge, pelvic pain. Concern for pelvic inflammatory disease or tubo-ovarian abscess.  EXAM: TRANSABDOMINAL AND TRANSVAGINAL ULTRASOUND OF PELVIS  DOPPLER ULTRASOUND OF OVARIES  TECHNIQUE: Both transabdominal and transvaginal ultrasound examinations of the pelvis were performed. Transabdominal technique was performed for global imaging of the pelvis including uterus, ovaries, adnexal regions, and pelvic cul-de-sac.  It was necessary to proceed with endovaginal exam following the transabdominal exam to visualize the uterus and ovaries. Color and duplex Doppler ultrasound was utilized to evaluate blood flow to the ovaries.  COMPARISON:  CT 05/05/2006  FINDINGS:  Uterus  Measurements: Normal in size at 10.0 x 5.0 x 6.4 cm.  Endometrium  Thickness: Normal in thickness for premenopausal female at 13 mm.  Right ovary  Measurements: Normal in size at 3.1 x 2.4 x 3.5 cm. Normal color Doppler flow and spectral waveforms.  Left ovary  Measurements: Normal in size at 2.9 x 3.1 x 2.6 cm. There is a dominant follicle measuring 2.2 cm. Normal color Doppler flow and spectral waveforms.  Pulsed Doppler evaluation of both ovaries demonstrates normal low-resistance arterial and venous waveforms.  Other findings  Small volume free fluid .  IMPRESSION: 1. Normal ovaries without evidence of torsion. 2. Normal uterus.   Electronically Signed   By: Genevive Bi M.D.   On: 03/24/2014 15:17   US Pelvis Complete  03/24/2014   CLINICAL DATA:  Vaginal discharge, pelvic pain. Concern for pelvic inflammatory disease or tubo-ovarian abscess.  EXAM: TRANSABDOMINAL AND TRANSVAGINAL ULTRASOUND OF PELVIS  DOPPLER ULTRASOUND OF OVARIES  TECHNIQUE: Both transabdominal and transvaginal ultrasound examinations of the pelvis were performed. Transabdominal technique was performed for  global imaging of the pelvis including uterus, ovaries, adnexal regions, and pelvic cul-de-sac.  It was necessary to proceed with endovaginal exam following the transabdominal exam to visualize the uterus and ovaries. Color and duplex Doppler ultrasound was utilized to evaluate blood flow to the ovaries.  COMPARISON:  CT 05/05/2006  FINDINGS: Uterus  Measurements: Normal in size at 10.0 x 5.0 x 6.4 cm.  Endometrium  Thickness: Normal in thickness for premenopausal female at 13 mm.  Right ovary  Measurements: Normal in size at 3.1 x 2.4 x 3.5 cm. Normal color Doppler flow and spectral waveforms.  Left ovary  Measurements: Normal in size at 2.9 x 3.1 x 2.6 cm. There is a dominant follicle measuring 2.2 cm. Normal color Doppler flow and spectral waveforms.  Pulsed Doppler evaluation of both ovaries demonstrates normal  low-resistance arterial and venous waveforms.  Other findings  Small volume free fluid .  IMPRESSION: 1. Normal ovaries without evidence of torsion. 2. Normal uterus.   Electronically Signed   By: Genevive BiStewart  Edmunds M.D.   On: 03/24/2014 15:17   [place] Art/ven Flow Abd Pelv Doppler  03/24/2014   CLINICAL DATA:  Vaginal discharge, pelvic pain. Concern for pelvic inflammatory disease or tubo-ovarian abscess.  EXAM: TRANSABDOMINAL AND TRANSVAGINAL ULTRASOUND OF PELVIS  DOPPLER ULTRASOUND OF OVARIES  TECHNIQUE: Both transabdominal and transvaginal ultrasound examinations of the pelvis were performed. Transabdominal technique was performed for global imaging of the pelvis including uterus, ovaries, adnexal regions, and pelvic cul-de-sac.  It was necessary to proceed with endovaginal exam following the transabdominal exam to visualize the uterus and ovaries. Color and duplex Doppler ultrasound was utilized to evaluate blood flow to the ovaries.  COMPARISON:  CT 05/05/2006  FINDINGS: Uterus  Measurements: Normal in size at 10.0 x 5.0 x 6.4 cm.  Endometrium  Thickness: Normal in thickness for premenopausal female at 13 mm.  Right ovary  Measurements: Normal in size at 3.1 x 2.4 x 3.5 cm. Normal color Doppler flow and spectral waveforms.  Left ovary  Measurements: Normal in size at 2.9 x 3.1 x 2.6 cm. There is a dominant follicle measuring 2.2 cm. Normal color Doppler flow and spectral waveforms.  Pulsed Doppler evaluation of both ovaries demonstrates normal low-resistance arterial and venous waveforms.  Other findings  Small volume free fluid .  IMPRESSION: 1. Normal ovaries without evidence of torsion. 2. Normal uterus.   Electronically Signed   By: Genevive BiStewart  Edmunds M.D.   On: 03/24/2014 15:17    Medications: Scheduled Meds: . ALPRAZolam  2 mg Oral Q6H  . celecoxib  200 mg Oral BID WC  . cloNIDine  0.1 mg Transdermal Q Mon  . enoxaparin (LOVENOX) injection  40 mg Subcutaneous Q24H  . fluconazole  150 mg Oral Once  .  gabapentin  600 mg Oral QID  . lamoTRIgine  25 mg Oral Daily  . methadone  40 mg Oral Daily  . metroNIDAZOLE  500 mg Oral Q12H  . mirtazapine  7.5 mg Oral QHS  . nicotine  21 mg Transdermal Daily  . OLANZapine  15 mg Oral QHS   Time spent: 25 minutes   LOS: 4 days   Mackenze Grandison M.D. [facility] 03/28/2014, 11:41 AM Pager: [phone]  If 7PM-7AM, please contact night-coverage [email or web address] Password [email or web address]

## 2014-03-28 NOTE — Clinical Social Work Psych Note (Signed)
Psych CSW contacted the following facilities regarding inpatient psychiatric placement: - [facility] at capacity - [facility]- [facility] capacity [facility]- does not appear to meet [facility] - [facility] - [facility]  [name], LCSWA [phone]  Psychiatric & Orthopedics (5N 1-16) Clinical Social Worker

## 2014-03-28 NOTE — Consult Note (Signed)
Psychiatry Consult Follow Up  Reason for Consult:  Polysubstance abuse, suicidal ideation Referring Physician:  Dr. Edwinna Areola is an 29 y.o. female. Total Time spent with patient: 20 minutes  Assessment: AXIS I:  Substance Abuse and Substance Induced Mood Disorder AXIS II:  Cluster B Traits AXIS III:   Past Medical History  Diagnosis Date  . Drug abuse   . GERD (gastroesophageal reflux disease)   . Depression   . Arthritis   . Seizures   . Chronic pain   . Methadone maintenance therapy patient   . Anxiety    AXIS IV:  housing problems and other psychosocial or environmental problems AXIS V:  21-30 behavior considerably influenced by delusions or hallucinations OR serious impairment in judgment, communication OR inability to function in almost all areas  Plan: Case dicussed with staff RN and case manager, will contact Dr. Tana Coast Continue IVC as she is risk for AWOL Continue safety sitter due to recent withdrawal seizure No evidence of imminent risk to self or others at present.   Recommend psychiatric Inpatient admission when medically cleared. Supportive therapy provided about ongoing stressors. Discussed crisis plan, support from social network, calling 911, coming to the Emergency Department, and calling Suicide Hotline. Appreciate psychiatric consultation and follow up as clinically required Please contact [phone] or [phone] if needs further assistance  Subjective:   [name] is a 29 y.o. female patient admitted with polysubstance abuse and seizure.  HPI:  This patient is a 29 year old single white female with a history of polysubstance abuse and multiple detox admissions who presented to the ED with complaints of wanting detox and having suicidal thoughts with a plan. She also admitted to auditory hallucinations. She claims she no longer was able to get her Xanax had been getting it off the street. She had been using Xanax 1 mg, 12 tabs a day for 3 weeks.  She is also on methadone maintenance at 40 mg daily. On admission she claims she wanted to kill herself because she lost custody of her daughter and her daughter has now been adopted. She was awaiting admission to the behavioral health hospital when she had a seizure in the ED. Urine drug screen was positive for benzodiazepines cocaine and THC. Now that she is admitted she is on alprazolam 3 mg every 6 hours. She feels better now and is no longer as anxious and denies suicidal ideation. She claims she has a plan to return to her boyfriend's house and he will maintain all of her medicines and she wants to stay on Xanax. I told her she could not stand this high dosage of Xanax outside the hospital and she wants to be on Xanax 1 mg 4 times a day. Have again explained that she has to get to a lower dosage per day before she can be discharged and perhaps transition to clonazepam which is safer. She does not want to be hospitalized denies any thoughts or plans to harm herself denies auditory or visual hallucinations.   Interval History: Patient seen and chart reviewed for psych consultation follow up from the weekend. Patient appeared near nursing station along with sitter. Patient stated that she is no longer feeling sadness or depression and denied being suicidal thoughts at this time. She is in agreement with reducing the xanax and may be cross titration of Klonopin to prevent withdrawal seizures. Staff RN strongly feels that she needs sitter due to panic episode this morning when she found  out that she may not get her methadone and become calm only after MD informed that she will get the medication which was managed by [facility] clinic. She was just discharged from behavioral hospital approximately 2 weeks ago Patient seen again on 03/26/14. She is calm and cooperative during this evaluation. Denies auditory and visual hallucinations. Slowly bring down dose of Xanax to a more reasonable level like 1 mg qid.     Past Psychiatric History: Past Medical History  Diagnosis Date  . Drug abuse   . GERD (gastroesophageal reflux disease)   . Depression   . Arthritis   . Seizures   . Chronic pain   . Methadone maintenance therapy patient   . Anxiety     reports that she has been smoking.  She has never used smokeless tobacco. She reports that she drinks alcohol. She reports that she uses illicit drugs (Heroin, Marijuana, and Benzodiazepines). Family History  Problem Relation Age of Onset  . Mental illness Mother   . Mental illness Brother   . Diabetes Maternal Grandmother   . Mental illness Maternal Grandmother   . Hypertension Maternal Grandfather   . Diabetes Paternal Grandmother   . Heart disease Paternal Grandmother      Living Arrangements: Alone   Abuse/Neglect [facility]) Physical Abuse: Denies Verbal Abuse: Denies Sexual Abuse: Denies Allergies:   Allergies  Allergen Reactions  . Narcan [Naloxone] Other (See Comments) and Itching    Violent withdrawal symptoms; interferes with methadone Interferes with methadone and causes withdrawal   . Suboxone [Buprenorphine Hcl-Naloxone Hcl] Other (See Comments) and Itching    Violent withdrawal symptoms; interferes with methadone Interferes with methadone and causes withdrawal      Objective: Blood pressure 108/67, pulse 98, temperature 98.4 F (36.9 C), temperature source Oral, resp. rate 18, height _0  (1.6 m), weight 79.3 kg (174 lb 13.2 oz), last menstrual period 02/13/2014, SpO2 100 %.Body mass index is 30.98 kg/(m^2). Results for orders placed or performed during the hospital encounter of 03/24/14 (from the past 72 hour(s))  Basic metabolic panel     Status: None   Collection Time: 03/25/14 12:00 PM  Result Value Ref Range   Sodium 139 135 - 145 mmol/L    Comment: Please note change in reference range.   Potassium 4.0 3.5 - 5.1 mmol/L    Comment: Please note change in reference range.   Chloride 106 96 - 112 mEq/L   CO2 24 19  - 32 mmol/L   Glucose, Bld 95 70 - 99 mg/dL   BUN 17 6 - 23 mg/dL   Creatinine, Ser 0.60 0.50 - 1.10 mg/dL   Calcium 9.1 8.4 - 10.5 mg/dL   GFR calc non Af Amer >90 >90 mL/min   GFR calc Af Amer >90 >90 mL/min    Comment: (NOTE) The eGFR has been calculated using the CKD EPI equation. This calculation has not been validated in all clinical situations. eGFR's persistently <90 mL/min signify possible Chronic Kidney Disease.    Anion gap 9 5 - 15  CBC     Status: Abnormal   Collection Time: 03/25/14 12:00 PM  Result Value Ref Range   WBC 3.4 (L) 4.0 - 10.5 K/uL   RBC 3.76 (L) 3.87 - 5.11 MIL/uL   Hemoglobin 11.2 (L) 12.0 - 15.0 g/dL   HCT 32.0 (L) 36.0 - 46.0 %   MCV 85.1 78.0 - 100.0 fL   MCH 29.8 26.0 - 34.0 pg   MCHC 35.0 30.0 -  36.0 g/dL   RDW 12.6 11.5 - 15.5 %   Platelets 194 150 - 400 K/uL   Labs are reviewed and are pertinent for positive for cocaine and benzodiazepines and THC.  Current Facility-Administered Medications  Medication Dose Route Frequency Provider Last Rate Last Dose  . acetaminophen (TYLENOL) tablet 650 mg  650 mg Oral Q6H PRN Theressa Millard, MD   650 mg at 03/27/14 2152   Or  . acetaminophen (TYLENOL) suppository 650 mg  650 mg Rectal Q6H PRN Theressa Millard, MD      . acetaminophen (TYLENOL) tablet 650 mg  650 mg Oral Q4H PRN Mercedes Strupp Camprubi-Soms, PA-C      . ALPRAZolam Duanne Moron) tablet 2 mg  2 mg Oral Q6H Ripudeep K Rai, MD   2 mg at 03/28/14 0502  . alum & mag hydroxide-simeth (MAALOX/MYLANTA) 200-200-20 MG/5ML suspension 30 mL  30 mL Oral PRN Mercedes Strupp Camprubi-Soms, PA-C      . alum & mag hydroxide-simeth (MAALOX/MYLANTA) 200-200-20 MG/5ML suspension 30 mL  30 mL Oral Q6H PRN Theressa Millard, MD      . celecoxib (CELEBREX) capsule 200 mg  200 mg Oral BID WC Mercedes Strupp Camprubi-Soms, PA-C   200 mg at 03/28/14 0941  . cloNIDine (CATAPRES - Dosed in mg/24 hr) patch 0.1 mg  0.1 mg Transdermal Q Mon Ripudeep K Rai, MD   0.1 mg  at 03/27/14 2151  . enoxaparin (LOVENOX) injection 40 mg  40 mg Subcutaneous Q24H Theressa Millard, MD   40 mg at 03/28/14 0942  . fluconazole (DIFLUCAN) tablet 150 mg  150 mg Oral Once Ripudeep K Rai, MD      . gabapentin (NEURONTIN) capsule 600 mg  600 mg Oral QID Mercedes Strupp Camprubi-Soms, PA-C   600 mg at 03/28/14 0942  . HYDROmorphone (DILAUDID) injection 0.5-1 mg  0.5-1 mg Intravenous Q3H PRN Theressa Millard, MD      . hydrOXYzine (ATARAX/VISTARIL) tablet 25 mg  25 mg Oral Q6H PRN Mercedes Strupp Camprubi-Soms, PA-C   25 mg at 03/27/14 1533  . ibuprofen (ADVIL,MOTRIN) tablet 600 mg  600 mg Oral Q8H PRN Mercedes Strupp Camprubi-Soms, PA-C   600 mg at 03/26/14 0753  . lamoTRIgine (LAMICTAL) tablet 25 mg  25 mg Oral Daily Mercedes Strupp Camprubi-Soms, PA-C   25 mg at 03/28/14 0942  . LORazepam (ATIVAN) tablet 1 mg  1 mg Oral Q8H PRN Mercedes Strupp Camprubi-Soms, PA-C   1 mg at 03/28/14 6644  . meclizine (ANTIVERT) tablet 25 mg  25 mg Oral TID PRN Mercedes Strupp Camprubi-Soms, PA-C   25 mg at 03/26/14 0941  . methadone (DOLOPHINE) tablet 40 mg  40 mg Oral Daily Ripudeep Krystal Eaton, MD   40 mg at 03/28/14 0941  . metroNIDAZOLE (FLAGYL) tablet 500 mg  500 mg Oral Q12H Mercedes Strupp Camprubi-Soms, PA-C   500 mg at 03/28/14 0942  . mirtazapine (REMERON) tablet 7.5 mg  7.5 mg Oral QHS Mercedes Strupp Camprubi-Soms, PA-C   7.5 mg at 03/27/14 2151  . nicotine (NICODERM CQ - dosed in mg/24 hours) patch 21 mg  21 mg Transdermal Daily Mercedes Strupp Camprubi-Soms, PA-C   21 mg at 03/28/14 0943  . OLANZapine (ZYPREXA) tablet 15 mg  15 mg Oral QHS Mercedes Strupp Camprubi-Soms, PA-C   15 mg at 03/27/14 2151  . ondansetron (ZOFRAN) tablet 4 mg  4 mg Oral Q6H PRN Theressa Millard, MD   4 mg at 03/27/14 0347   Or  .  ondansetron (ZOFRAN) injection 4 mg  4 mg Intravenous Q6H PRN Theressa Millard, MD      . ondansetron [facility] tablet 4 mg  4 mg Oral Q8H PRN Mercedes Strupp Camprubi-Soms, PA-C   4 mg  at 03/24/14 1949  . oxyCODONE (Oxy IR/ROXICODONE) immediate release tablet 5 mg  5 mg Oral Q4H PRN Theressa Millard, MD      . zolpidem (AMBIEN) tablet 5 mg  5 mg Oral QHS PRN Mercedes Strupp Camprubi-Soms, PA-C        Psychiatric Specialty Exam:     Blood pressure 108/67, pulse 98, temperature 98.4 F (36.9 C), temperature source Oral, resp. rate 18, height _0  (1.6 m), weight 79.3 kg (174 lb 13.2 oz), last menstrual period 02/13/2014, SpO2 100 %.Body mass index is 30.98 kg/(m^2).  General Appearance: Casual and Disheveled  Eye Contact::  Fair  Speech:  Pressured  Volume:  Normal  Mood:  Fairly good  Affect:  brighter  Thought Process:  wnls  Orientation:  Full (Time, Place, and Person)  Thought Content:  Rumination  Suicidal Thoughts:  No  Homicidal Thoughts:  No  Memory:  Immediate;   Fair Recent;   Fair Remote;   Fair  Judgement:  Poor  Insight:  Lacking  Psychomotor Activity:  Normal  Concentration:  Fair  Recall:  [employer] of Knowledge:Poor  Language: Good  Akathisia:  No  Handed:  Right  AIMS (if indicated):     Assets:  Communication Skills Desire for Improvement Social Support  Sleep:      Musculoskeletal: Strength & Muscle Tone: within normal limits Gait & Station: normal Patient leans: N/A  Treatment Plan Summary: Daily contact with patient to assess and evaluate symptoms and progress in treatment Medication management Patient denies being suicidal and declines admission to the behavioral health hospital.  She claims she now has an ACTT tean and a new psychiatrist at [facility] care. Patient needs IVC due to frequent statements of leaving the hospital when she does not like the treatment etc Obviously she cannot be discharged on 9 mg of Xanax per day so this needs to be slowly decreased and clonazepam substituted.  Recommend slowly wean down xanax, try to get to more reasonalble level like 1 mg qid.  Halia Franey,JANARDHAHA R. 03/28/2014  10:48 AM

## 2014-03-29 DIAGNOSIS — F13230 Sedative, hypnotic or anxiolytic dependence with withdrawal, uncomplicated: Secondary | ICD-10-CM

## 2014-03-29 DIAGNOSIS — F1910 Other psychoactive substance abuse, uncomplicated: Secondary | ICD-10-CM | POA: Insufficient documentation

## 2014-03-29 LAB — HEPATITIS PANEL, ACUTE
HCV Ab: REACTIVE — AB
HEP B S AG: NEGATIVE
Hep A IgM: NONREACTIVE
Hep B C IgM: NONREACTIVE

## 2014-03-29 LAB — HCV RNA QUANT
HCV QUANT: 7310 [IU]/mL — AB (ref <15)
HCV Quantitative Log: 3.86 {Log} — ABNORMAL HIGH (ref <1.18)

## 2014-03-29 MED ORDER — LAMOTRIGINE 25 MG PO TABS
25.0000 mg | ORAL_TABLET | Freq: Two times a day (BID) | ORAL | Status: DC
  Administered 2014-03-29 – 2014-03-30 (×2): 25 mg via ORAL
  Filled 2014-03-29 (×3): qty 1

## 2014-03-29 MED ORDER — CLONAZEPAM 1 MG PO TABS
1.0000 mg | ORAL_TABLET | Freq: Three times a day (TID) | ORAL | Status: DC
  Administered 2014-03-29 – 2014-03-30 (×4): 1 mg via ORAL
  Filled 2014-03-29 (×4): qty 1

## 2014-03-29 NOTE — Consult Note (Signed)
Psychiatry Consult Follow Up  Reason for Consult:  Polysubstance abuse, suicidal ideation Referring Physician:  [name] is an 29 y.o. female. Total Time spent with patient: 20 minutes  Assessment: AXIS I:  Substance Abuse and Substance Induced Mood Disorder AXIS II:  Cluster B Traits AXIS III:   Past Medical History  Diagnosis Date  . Drug abuse   . GERD (gastroesophageal reflux disease)   . Depression   . Arthritis   . Seizures   . Chronic pain   . Methadone maintenance therapy patient   . Anxiety    AXIS IV:  housing problems and other psychosocial or environmental problems AXIS V:  21-30 behavior considerably influenced by delusions or hallucinations OR serious impairment in judgment, communication OR inability to function in almost all areas  Plan: Case dicussed with staff RN and [name], PA-C Increase Lamictal 25 mg PO BID for mood swings Discontinue Xanax and start Klonopin 1 mg PO TID Continue IVC as she is risk for AWOL Continue safety sitter due to recent withdrawal seizure No evidence of imminent risk to self or others at present.   Recommend psychiatric Inpatient admission when medically cleared. Supportive therapy provided about ongoing stressors. Discussed crisis plan, support from social network, calling 911, coming to the Emergency Department, and calling Suicide Hotline. Appreciate psychiatric consultation and follow up as clinically required Please contact [phone] or [phone] if needs further assistance  Subjective:   [name] is a 29 y.o. female patient admitted with polysubstance abuse and seizure.  HPI:  This patient is a 29 year old single white female with a history of polysubstance abuse and multiple detox admissions who presented to the ED with complaints of wanting detox and having suicidal thoughts with a plan. She also admitted to auditory hallucinations. She claims she no longer was able to get her Xanax had been  getting it off the street. She had been using Xanax 1 mg, 12 tabs a day for 3 weeks. She is also on methadone maintenance at 40 mg daily. On admission she claims she wanted to kill herself because she lost custody of her daughter and her daughter has now been adopted. She was awaiting admission to the behavioral health hospital when she had a seizure in the ED. Urine drug screen was positive for benzodiazepines cocaine and THC. Now that she is admitted she is on alprazolam 3 mg every 6 hours. She feels better now and is no longer as anxious and denies suicidal ideation. She claims she has a plan to return to her boyfriend's house and he will maintain all of her medicines and she wants to stay on Xanax. I told her she could not stand this high dosage of Xanax outside the hospital and she wants to be on Xanax 1 mg 4 times a day. Have again explained that she has to get to a lower dosage per day before she can be discharged and perhaps transition to clonazepam which is safer. She does not want to be hospitalized denies any thoughts or plans to harm herself denies auditory or visual hallucinations.   Interval History: Patient appeared staying in her bed and complained feeling tired, restless and increased anxiety as her medication has been reduced and says she is willing to proceed to change xanax to klonopin as planned. Patient repeatedly asking not to send to [facility] because she is not feeling depression and suicide ideations and contract for safety. Patient says her current mild withdrawal  symptoms may included being on small dose of methadone in addition to reduced xanax or cross titrate with klonopin. RN strongly feels that she needs sitter due to panic episodes and recent withdrawal seizure. Slowly bring down dose of Xanax to a more reasonable level and cross titrate to Klonopin which is a long acting for anxiety and to prevent withdrawal seizure.    Past Psychiatric History: Past Medical History  Diagnosis  Date  . Drug abuse   . GERD (gastroesophageal reflux disease)   . Depression   . Arthritis   . Seizures   . Chronic pain   . Methadone maintenance therapy patient   . Anxiety     reports that she has been smoking.  She has never used smokeless tobacco. She reports that she drinks alcohol. She reports that she uses illicit drugs (Heroin, Marijuana, and Benzodiazepines). Family History  Problem Relation Age of Onset  . Mental illness Mother   . Mental illness Brother   . Diabetes Maternal Grandmother   . Mental illness Maternal Grandmother   . Hypertension Maternal Grandfather   . Diabetes Paternal Grandmother   . Heart disease Paternal Grandmother      Living Arrangements: Alone   Abuse/Neglect [facility] Physical Abuse: Denies Verbal Abuse: Denies Sexual Abuse: Denies Allergies:   Allergies  Allergen Reactions  . Narcan [Naloxone] Other (See Comments) and Itching    Violent withdrawal symptoms; interferes with methadone Interferes with methadone and causes withdrawal   . Suboxone [Buprenorphine Hcl-Naloxone Hcl] Other (See Comments) and Itching    Violent withdrawal symptoms; interferes with methadone Interferes with methadone and causes withdrawal      Objective: Blood pressure 103/47, pulse 84, temperature 98 F (36.7 C), temperature source Oral, resp. rate 18, height 5\' 3"  (1.6 m), weight 79.3 kg (174 lb 13.2 oz), last menstrual period 02/13/2014, SpO2 100 %.Body mass index is 30.98 kg/(m^2). Results for orders placed or performed during the hospital encounter of 03/24/14 (from the past 72 hour(s))  Hepatitis panel, acute     Status: Abnormal   Collection Time: 03/28/14 10:00 AM  Result Value Ref Range   Hepatitis B Surface Ag NEGATIVE NEGATIVE   HCV Ab Reactive (A) NEGATIVE   Hep A IgM NON REACTIVE NON REACTIVE    Comment: (NOTE) Effective January 23, 2014, Hepatitis Acute Panel (test code [id number]) will be revised to automatically reflex to the Hepatitis C Viral  RNA, Quantitative, Real-Time PCR assay if the Hepatitis C antibody screening result is Reactive. This action is being taken to ensure that the CDC/USPSTF recommended HCV diagnostic algorithm with the appropriate test reflex needed for accurate interpretation is followed.    Hep B C IgM NON REACTIVE NON REACTIVE    Comment: (NOTE) High levels of Hepatitis B Core IgM antibody are detectable during the acute stage of Hepatitis B. This antibody is used to differentiate current from past HBV infection. Performed at [facility]    Labs are reviewed and are pertinent for positive for cocaine and benzodiazepines and THC.  Current Facility-Administered Medications  Medication Dose Route Frequency Provider Last Rate Last Dose  . acetaminophen (TYLENOL) tablet 650 mg  650 mg Oral Q6H PRN Ron ParkerHarvette C Jenkins, MD   650 mg at 03/28/14 1950   Or  . acetaminophen (TYLENOL) suppository 650 mg  650 mg Rectal Q6H PRN Ron ParkerHarvette C Jenkins, MD      . acetaminophen (TYLENOL) tablet 650 mg  650 mg Oral Q4H PRN Mercedes Strupp Camprubi-Soms, PA-C      .  ALPRAZolam Prudy Feeler) tablet 1 mg  1 mg Oral QID Ripudeep Jenna Luo, MD   1 mg at 03/29/14 0916  . alum & mag hydroxide-simeth (MAALOX/MYLANTA) 200-200-20 MG/5ML suspension 30 mL  30 mL Oral PRN Mercedes Strupp Camprubi-Soms, PA-C      . alum & mag hydroxide-simeth (MAALOX/MYLANTA) 200-200-20 MG/5ML suspension 30 mL  30 mL Oral Q6H PRN Ron Parker, MD      . celecoxib (CELEBREX) capsule 200 mg  200 mg Oral BID WC Mercedes Strupp Camprubi-Soms, PA-C   200 mg at 03/29/14 0841  . cloNIDine (CATAPRES - Dosed in mg/24 hr) patch 0.1 mg  0.1 mg Transdermal Q Mon Ripudeep K Rai, MD   0.1 mg at 03/27/14 2151  . docusate sodium (COLACE) capsule 200 mg  200 mg Oral Daily PRN Leda Gauze, NP      . enoxaparin (LOVENOX) injection 40 mg  40 mg Subcutaneous Q24H Ron Parker, MD   40 mg at 03/29/14 0917  . gabapentin (NEURONTIN) capsule 600 mg  600 mg  Oral QID Mercedes Strupp Camprubi-Soms, PA-C   600 mg at 03/29/14 0916  . HYDROmorphone (DILAUDID) injection 0.5-1 mg  0.5-1 mg Intravenous Q3H PRN Ron Parker, MD      . hydrOXYzine (ATARAX/VISTARIL) tablet 25 mg  25 mg Oral Q6H PRN Mercedes Strupp Camprubi-Soms, PA-C   25 mg at 03/27/14 1533  . ibuprofen (ADVIL,MOTRIN) tablet 600 mg  600 mg Oral Q8H PRN Mercedes Strupp Camprubi-Soms, PA-C   600 mg at 03/26/14 0753  . lamoTRIgine (LAMICTAL) tablet 25 mg  25 mg Oral Daily Mercedes Strupp Camprubi-Soms, PA-C   25 mg at 03/29/14 0916  . LORazepam (ATIVAN) tablet 1 mg  1 mg Oral Q8H PRN Mercedes Strupp Camprubi-Soms, PA-C   1 mg at 03/29/14 4034  . meclizine (ANTIVERT) tablet 25 mg  25 mg Oral TID PRN Mercedes Strupp Camprubi-Soms, PA-C   25 mg at 03/29/14 0151  . methadone (DOLOPHINE) tablet 40 mg  40 mg Oral Daily Ripudeep Jenna Luo, MD   40 mg at 03/29/14 0916  . metroNIDAZOLE (FLAGYL) tablet 500 mg  500 mg Oral Q12H Mercedes Strupp Camprubi-Soms, PA-C   500 mg at 03/29/14 0916  . mirtazapine (REMERON) tablet 7.5 mg  7.5 mg Oral QHS Mercedes Strupp Camprubi-Soms, PA-C   7.5 mg at 03/28/14 2311  . nicotine (NICODERM CQ - dosed in mg/24 hours) patch 21 mg  21 mg Transdermal Daily Mercedes Strupp Camprubi-Soms, PA-C   21 mg at 03/29/14 0916  . OLANZapine (ZYPREXA) tablet 15 mg  15 mg Oral QHS Mercedes Strupp Camprubi-Soms, PA-C   15 mg at 03/28/14 2312  . ondansetron (ZOFRAN) tablet 4 mg  4 mg Oral Q6H PRN Ron Parker, MD   4 mg at 03/27/14 7425   Or  . ondansetron (ZOFRAN) injection 4 mg  4 mg Intravenous Q6H PRN Ron Parker, MD      . ondansetron [facility]) tablet 4 mg  4 mg Oral Q8H PRN Mercedes Strupp Camprubi-Soms, PA-C   4 mg at 03/24/14 1949  . oxyCODONE (Oxy IR/ROXICODONE) immediate release tablet 5 mg  5 mg Oral Q4H PRN Ron Parker, MD      . polyethylene glycol (MIRALAX / GLYCOLAX) packet 17 g  17 g Oral Daily PRN Leda Gauze, NP      . zolpidem (AMBIEN) tablet  5 mg  5 mg Oral QHS PRN Mercedes Strupp Camprubi-Soms, PA-C  Psychiatric Specialty Exam:     Blood pressure 103/47, pulse 84, temperature 98 F (36.7 C), temperature source Oral, resp. rate 18, height  (1.6 m), weight 79.3 kg (174 lb 13.2 oz), last menstrual period 02/13/2014, SpO2 100 %.Body mass index is 30.98 kg/(m^2).  General Appearance: Casual and Disheveled  Eye Contact::  Fair  Speech:  Pressured  Volume:  Normal  Mood:  Fairly good  Affect:  brighter  Thought Process:  wnls  Orientation:  Full (Time, Place, and Person)  Thought Content:  Rumination  Suicidal Thoughts:  No  Homicidal Thoughts:  No  Memory:  Immediate;   Fair Recent;   Fair Remote;   Fair  Judgement:  Poor  Insight:  Lacking  Psychomotor Activity:  Normal  Concentration:  Fair  Recall:  [employer] of Knowledge:Poor  Language: Good  Akathisia:  No  Handed:  Right  AIMS (if indicated):     Assets:  Communication Skills Desire for Improvement Social Support  Sleep:      Musculoskeletal: Strength & Muscle Tone: within normal limits Gait & Station: normal Patient leans: N/A  Treatment Plan Summary: Daily contact with patient to assess and evaluate symptoms and progress in treatment Medication management Patient denies being suicidal and declines admission to the behavioral health hospital.  She has an ACTT tean and a new psychiatrist at [facility] care. Patient needs IVC due to frequent AWOL and possibility of withdrawal seizures She has been tolerating lowering of Xanax to 1 mg QID for now  Recommend cross titration of xanax 1 mg QID to Clonazepam 1 mg PO TID and than BID Monitor for benzo withdrawal symptoms  Raymie Trani,JANARDHAHA R. 03/29/2014 11:01 AM

## 2014-03-29 NOTE — Progress Notes (Addendum)
Patient ID: [name]  female  XBJ:[id number]    DOB: 30-Mar-1985    DOA: 03/24/2014  PCP: No primary care provider on file.  Brief history of present illness  Patient is a 29 year old female with PMH of polysubstance abuse, Bipolar disorder, and depression that presented to ED with suicidal ideation with plan, auditory hallucinations, and wanting detox treatment. Patient reported increased anxiety and stresses lately, no longer has Medicaid, was not able to get her prescribed Xanax associate was getting Xanax off the streets. She reported that she had been taking Xanax 1 mg tablet about 12 times a day for last 3 weeks. She also goes to methadone clinic and now down to 40 mg daily. She reported that she wanted to kill herself as she lost custody of her daughter who is now adopted. Patient was seen by EDP, was awaiting placement at behavioral health but suffered a tonic-clonic seizure witnessed by staff lasted for 1 minute. UDS positive for benzodiazepine, cocaine and THC. Patient was admitted for further workup.  Assessment/Plan:   Benzodiazepine withdrawals -Pt with withdrawal seizure, was taking  Xanax off the streets, had SI with plan.  Pt has been IVC'd due to risk of withdrawal seizures as she repeatedly states she wants to leave AMA when she doesn't like the treatment plan -Psychiatry on board, rec's- (1/20 ) She is to remain inpatient and transition to Clonazepam  TID from Xanax  QID. Possible [facility] placement tomorrow 03/30/14  -Tapered of Xanax, now on Klonopin  TID.  -Sitter at bedside  Withdrawal seizures -Tapered off Xanax, currently of Klonopin  TID.   - Continue oral Ativan as needed for anxiety/seizure  Polysubstance abuse - UDS positive for benzodiazepines, cocaine, THC  Depression, Suicidal ideation - At this time, patient denies any active suicidal ideation, psychiatry to decide if sitter needs to be continued -Continue Remeron  Hepatitis C - Chronic  issue  Methadone maintenance therapy patient - Continue methadone 40 mg daily, patient agitated about her methadone dose missed this morning, states that she has been on methadone for 11 years.  Bacterial vaginosis - Wet prep with clue cells -Administered a single doses of Rocephin, azithromycin and metronidazole -Continue Metronidazole  q 12 for 7 days.  Bipolar disorder Continue olanzapine   Tobacco abuse -counseled on cessation. nicoderm patch   DVT Prophylaxis: Lovenox  Code Status: Full  Family Communication:  No family at bedside  Disposition: Per psych- keep inpatient, transition to Klonopin  TID before [facility] to [facility], possible transfer 03/30/14   Consultants: Psychiatry   Procedures: None   Antibiotics:  Rocephin, Zithromax, metronidazole   Subjective: States wants to go home. denies SI/HI.  Sitter at bedside   Objective: Weight change:   Intake/Output Summary (Last 24 hours) at 03/29/14 1146 Last data filed at 03/29/14 0944  Gross per 24 hour  Intake    840 ml  Output      0 ml  Net    840 ml   Blood pressure 103/47, pulse 84, temperature 98 F (36.7 C), temperature source Oral, resp. rate 18, height  (1.6 m), weight 79.3 kg (174 lb 13.2 oz), last menstrual period 02/13/2014, SpO2 100 %.  Physical Exam: General: Alert and awake, oriented 3 Caucasian female in NAD CVS: S1-S2 clear Chest: CTAB Abdomen: soft NT, ND, NBS Extremities: no c/c/e b/l   Lab Results: Basic Metabolic Panel:  Recent Labs Lab 03/24/14 1121 03/25/14 1200  NA 137 139  K 3.5 4.0  CL  106 106  CO2 23 24  GLUCOSE 88 95  BUN 15 17  CREATININE 0.65 0.60  CALCIUM 9.0 9.1   Liver Function Tests:  Recent Labs Lab 03/24/14 1121  AST 28  ALT 27  ALKPHOS 54  BILITOT 0.4  PROT 7.3  ALBUMIN 4.0   No results for input(s): LIPASE, AMYLASE in the last 168 hours. No results for input(s): AMMONIA in the last 168 hours. CBC:  Recent Labs Lab  03/24/14 1121 03/25/14 1200  WBC 5.8 3.4*  NEUTROABS 4.1  --   HGB 11.7* 11.2*  HCT 32.7* 32.0*  MCV 84.3 85.1  PLT 221 194   Cardiac Enzymes: No results for input(s): CKTOTAL, CKMB, CKMBINDEX, TROPONINI in the last 168 hours. BNP: Invalid input(s): POCBNP CBG:  Recent Labs Lab 03/24/14 1833  GLUCAP 106*     Micro Results: Recent Results (from the past 240 hour(s))  Wet prep, genital     Status: Abnormal   Collection Time: 03/24/14 12:44 PM  Result Value Ref Range Status   Yeast Wet Prep HPF POC NONE SEEN NONE SEEN Final   Trich, Wet Prep NONE SEEN NONE SEEN Final   Clue Cells Wet Prep HPF POC MODERATE (A) NONE SEEN Final   WBC, Wet Prep HPF POC FEW (A) NONE SEEN Final  MRSA PCR Screening     Status: None   Collection Time: 03/25/14  4:41 AM  Result Value Ref Range Status   MRSA by PCR NEGATIVE NEGATIVE Final    Comment:        The GeneXpert MRSA Assay (FDA approved for NASAL specimens only), is one component of a comprehensive MRSA colonization surveillance program. It is not intended to diagnose MRSA infection nor to guide or monitor treatment for MRSA infections.     Studies/Results: US Transvaginal Non-ob  03/24/2014   CLINICAL DATA:  Vaginal discharge, pelvic pain. Concern for pelvic inflammatory disease or tubo-ovarian abscess.  EXAM: TRANSABDOMINAL AND TRANSVAGINAL ULTRASOUND OF PELVIS  DOPPLER ULTRASOUND OF OVARIES  TECHNIQUE: Both transabdominal and transvaginal ultrasound examinations of the pelvis were performed. Transabdominal technique was performed for global imaging of the pelvis including uterus, ovaries, adnexal regions, and pelvic cul-de-sac.  It was necessary to proceed with endovaginal exam following the transabdominal exam to visualize the uterus and ovaries. Color and duplex Doppler ultrasound was utilized to evaluate blood flow to the ovaries.  COMPARISON:  CT 05/05/2006  FINDINGS: Uterus  Measurements: Normal in size at 10.0 x 5.0 x 6.4 cm.   Endometrium  Thickness: Normal in thickness for premenopausal female at 13 mm.  Right ovary  Measurements: Normal in size at 3.1 x 2.4 x 3.5 cm. Normal color Doppler flow and spectral waveforms.  Left ovary  Measurements: Normal in size at 2.9 x 3.1 x 2.6 cm. There is a dominant follicle measuring 2.2 cm. Normal color Doppler flow and spectral waveforms.  Pulsed Doppler evaluation of both ovaries demonstrates normal low-resistance arterial and venous waveforms.  Other findings  Small volume free fluid .  IMPRESSION: 1. Normal ovaries without evidence of torsion. 2. Normal uterus.   Electronically Signed   By: Genevive Bi M.D.   On: 03/24/2014 15:17   US Pelvis Complete  03/24/2014   CLINICAL DATA:  Vaginal discharge, pelvic pain. Concern for pelvic inflammatory disease or tubo-ovarian abscess.  EXAM: TRANSABDOMINAL AND TRANSVAGINAL ULTRASOUND OF PELVIS  DOPPLER ULTRASOUND OF OVARIES  TECHNIQUE: Both transabdominal and transvaginal ultrasound examinations of the pelvis were performed. Transabdominal technique was performed for  global imaging of the pelvis including uterus, ovaries, adnexal regions, and pelvic cul-de-sac.  It was necessary to proceed with endovaginal exam following the transabdominal exam to visualize the uterus and ovaries. Color and duplex Doppler ultrasound was utilized to evaluate blood flow to the ovaries.  COMPARISON:  CT 05/05/2006  FINDINGS: Uterus  Measurements: Normal in size at 10.0 x 5.0 x 6.4 cm.  Endometrium  Thickness: Normal in thickness for premenopausal female at 13 mm.  Right ovary  Measurements: Normal in size at 3.1 x 2.4 x 3.5 cm. Normal color Doppler flow and spectral waveforms.  Left ovary  Measurements: Normal in size at 2.9 x 3.1 x 2.6 cm. There is a dominant follicle measuring 2.2 cm. Normal color Doppler flow and spectral waveforms.  Pulsed Doppler evaluation of both ovaries demonstrates normal low-resistance arterial and venous waveforms.  Other findings  Small  volume free fluid .  IMPRESSION: 1. Normal ovaries without evidence of torsion. 2. Normal uterus.   Electronically Signed   By: Genevive BiStewart  Edmunds M.D.   On: 03/24/2014 15:17   [place] Art/ven Flow Abd Pelv Doppler  03/24/2014   CLINICAL DATA:  Vaginal discharge, pelvic pain. Concern for pelvic inflammatory disease or tubo-ovarian abscess.  EXAM: TRANSABDOMINAL AND TRANSVAGINAL ULTRASOUND OF PELVIS  DOPPLER ULTRASOUND OF OVARIES  TECHNIQUE: Both transabdominal and transvaginal ultrasound examinations of the pelvis were performed. Transabdominal technique was performed for global imaging of the pelvis including uterus, ovaries, adnexal regions, and pelvic cul-de-sac.  It was necessary to proceed with endovaginal exam following the transabdominal exam to visualize the uterus and ovaries. Color and duplex Doppler ultrasound was utilized to evaluate blood flow to the ovaries.  COMPARISON:  CT 05/05/2006  FINDINGS: Uterus  Measurements: Normal in size at 10.0 x 5.0 x 6.4 cm.  Endometrium  Thickness: Normal in thickness for premenopausal female at 13 mm.  Right ovary  Measurements: Normal in size at 3.1 x 2.4 x 3.5 cm. Normal color Doppler flow and spectral waveforms.  Left ovary  Measurements: Normal in size at 2.9 x 3.1 x 2.6 cm. There is a dominant follicle measuring 2.2 cm. Normal color Doppler flow and spectral waveforms.  Pulsed Doppler evaluation of both ovaries demonstrates normal low-resistance arterial and venous waveforms.  Other findings  Small volume free fluid .  IMPRESSION: 1. Normal ovaries without evidence of torsion. 2. Normal uterus.   Electronically Signed   By: Genevive BiStewart  Edmunds M.D.   On: 03/24/2014 15:17    Medications: Scheduled Meds: . ALPRAZolam  1 mg Oral QID  . celecoxib  200 mg Oral BID WC  . cloNIDine  0.1 mg Transdermal Q Mon  . enoxaparin (LOVENOX) injection  40 mg Subcutaneous Q24H  . gabapentin  600 mg Oral QID  . lamoTRIgine  25 mg Oral Daily  . methadone  40 mg Oral Daily  .  metroNIDAZOLE  500 mg Oral Q12H  . mirtazapine  7.5 mg Oral QHS  . nicotine  21 mg Transdermal Daily  . OLANZapine  15 mg Oral QHS   Time spent: 25 minutes   LOS: 5 days   Illa Levelsman, Sahar M.D. [facility] 03/29/2014, 11:46 AM Pager: [phone]  If 7PM-7AM, please contact night-coverage [email or web address] Password [email or web address]

## 2014-03-29 NOTE — Progress Notes (Signed)
Chaplain responded to consult.  Sitter also at bedside.  Pt shared understanding of addiction and withdrawal process as well as process of detox.  Pt is distressed about losing child, husband and potentially driver's license.  Pt shared that detox will be different this time because "this time I am doing it for me."  God is important to pt as she stated that it gives her comfort to know that God is right beside her and present for her as she goes through "this."  Chaplain provided emotional and spiritual support as well as the ministry of empathetic listening and presence.  Chaplain will follow up as needed.    03/29/14 1500  Clinical Encounter Type  Visited With Patient;Health care provider  Visit Type Initial;Psychological support;Spiritual support;Social support;Behavioral Health  Referral From Physician  Spiritual Encounters  Spiritual Needs Emotional;Grief support  Stress Factors  Patient Stress Factors Exhausted;Family relationships;Financial concerns;Health changes;Lack of knowledge;Loss of control;Major life changes   Blain PaisOvercash, Trevonte Ashkar A, Chaplain 03/29/2014 3:58 PM

## 2014-03-30 MED ORDER — CLONAZEPAM 1 MG PO TABS: 1.0000 mg | ORAL_TABLET | Freq: Three times a day (TID) | ORAL | Status: DC

## 2014-03-30 NOTE — Clinical Social Work Psych Note (Addendum)
Psych CSW [facility] with [facility] at [facility] and made aware of medical readiness.  Charge states no bed at this time, but expecting discharges this afternoon.  Psych CSW will continue to seek placement.  Psych CSW referred [facility] to the following: [facility]- [facility] available [facility] [employer]- at [employer] [place]- [facility]  Vickii PennaGina Mavis Fichera, LCSWA [phone]  Psychiatric & Orthopedics (5N 1-16) Clinical Social Worker

## 2014-03-30 NOTE — Progress Notes (Signed)
Patient discharge teaching given, including activity, diet, follow-up appoints, and medications. Patient verbalized understanding of all discharge instructions. IV access was d/c'd. Vitals are stable. Skin is intact except as charted in most recent assessments. Pt to be escorted out by NT, to be driven home by family. 

## 2014-03-30 NOTE — Clinical Social Work Psych Note (Signed)
Psych CSW spoke with patient who reports no suicidal ideations.  Patient is requesting to go home.  Patient's involuntary commitment paperwork has expired.  [facility]  [facility] been informed of patient's discharge of today and pending the weather, will see the patient within 24 hours of returning home.  Psych CSW spoke with grandfather, [name] who reports he is very supportive of the patient, but has little means to assist financially.  Grandfather confirms that patient lives with significant other and his mother.  Grandfather has no reservation regarding the patient's return home.   The following services have been put in place for patient: [facility] - 03/31/2014 8am-10am  patient is to bring the discharge paperwork  [facility]- [facility] follow-up visit January 28 12:30pm with Melissa DoverMuntasser, MD- patient is to bring discharge paperwork [facility] has been arranged and will meet with the patient between 1/22 and 1/23 depending on the weather [facility] [phone]- patient has received services (assistance with medication and transportation) in the past and is welcomed to re-apply for assistance. Patient is aware [facility]- appointment [facility] Jan [facility]th at [facility]  Psych CSW has reviewed these discharge plans with the psychiatrist and patient.  All parties are agreeable.  Patient agrees to be compliant with follow-up.  Patient was given the 24 hour crisis information and encouraged contact the crisis line and ACTT team if feeling unsafe.  [name], LCSWA [phone]  Psychiatric & Orthopedics (5N 1-16) Clinical Social Worker

## 2014-03-30 NOTE — Consult Note (Signed)
Psychiatry Consult Follow Up  [name] is an 29 y.o. female. Total Time spent with patient: 20 minutes  Assessment: AXIS I:  Substance Abuse and Substance Induced Mood Disorder AXIS II:  Cluster B Traits AXIS III:   Past Medical History  Diagnosis Date  . Drug abuse   . GERD (gastroesophageal reflux disease)   . Depression   . Arthritis   . Seizures   . Chronic pain   . Methadone maintenance therapy patient   . Anxiety    AXIS IV:  housing problems and other psychosocial or environmental problems AXIS V:  21-30 behavior considerably influenced by delusions or hallucinations OR serious impairment in judgment, communication OR inability to function in almost all areas  Plan: Case dicussed with Lacy Duverney, PA-C and psych social service Patient has no suicidal/homicidal ideation, intention or plans Refer to [facility] ACT team and medication treatment as out patient Rescind  IVC and discontinue safety sitter Continue Clonazepam 1 mg PO TID  No evidence of imminent risk to self or others at present.   Patient does not meet criteria for psychiatric inpatient admission. Supportive therapy provided about ongoing stressors. Appreciate psychiatric consultation and will sign off at this time Please contact [phone] or [phone] if needs further assistance  Subjective:   [name] is a 29 y.o. female patient admitted with polysubstance abuse and seizure.  HPI:  This patient is a 29 year old single white female with a history of polysubstance abuse and multiple detox admissions who presented to the ED with complaints of wanting detox and having suicidal thoughts with a plan. She also admitted to auditory hallucinations. She claims she no longer was able to get her Xanax had been getting it off the street. She had been using Xanax 1 mg, 12 tabs a day for 3 weeks. She is also on methadone maintenance at 40 mg daily. On admission she claims she wanted to kill herself because she lost  custody of her daughter and her daughter has now been adopted. She was awaiting admission to the behavioral health hospital when she had a seizure in the ED. Urine drug screen was positive for benzodiazepines cocaine and THC. Now that she is admitted she is on alprazolam 3 mg every 6 hours. She feels better now and is no longer as anxious and denies suicidal ideation. She claims she has a plan to return to her boyfriend's house and he will maintain all of her medicines and she wants to stay on Xanax. I told her she could not stand this high dosage of Xanax outside the hospital and she wants to be on Xanax 1 mg 4 times a day. Have again explained that she has to get to a lower dosage per day before she can be discharged and perhaps transition to clonazepam which is safer. She does not want to be hospitalized denies any thoughts or plans to harm herself denies auditory or visual hallucinations.   Interval History: Patient has been doing well without behavioral or emotional problems. Patient has been doing well since xanax was tapered off and started clonazepam for ongoing anxiety. Patient repeated asked not to send to [facility] because she has no current symptoms of depression and suicide ideations. She contract for safety. Patient has been in contact with her BF, substance abuse counselor and made arragements for out patient treatment for both substance abuse, mental health and ACT team services with the help of care coordinator from [facility]. She has no symptoms  of benzodiazepine withdrawal at this time.   Past Psychiatric History: Past Medical History  Diagnosis Date  . Drug abuse   . GERD (gastroesophageal reflux disease)   . Depression   . Arthritis   . Seizures   . Chronic pain   . Methadone maintenance therapy patient   . Anxiety     reports that she has been smoking.  She has never used smokeless tobacco. She reports that she drinks alcohol. She reports that she uses illicit drugs (Heroin,  Marijuana, and Benzodiazepines). Family History  Problem Relation Age of Onset  . Mental illness Mother   . Mental illness Brother   . Diabetes Maternal Grandmother   . Mental illness Maternal Grandmother   . Hypertension Maternal Grandfather   . Diabetes Paternal Grandmother   . Heart disease Paternal Grandmother      Living Arrangements: Alone   Abuse/Neglect [facility]) Physical Abuse: Denies Verbal Abuse: Denies Sexual Abuse: Denies Allergies:   Allergies  Allergen Reactions  . Narcan [Naloxone] Other (See Comments) and Itching    Violent withdrawal symptoms; interferes with methadone Interferes with methadone and causes withdrawal   . Suboxone [Buprenorphine Hcl-Naloxone Hcl] Other (See Comments) and Itching    Violent withdrawal symptoms; interferes with methadone Interferes with methadone and causes withdrawal      Objective: Blood pressure 100/56, pulse 112, temperature 97.6 F (36.4 C), temperature source Oral, resp. rate 20, height 5' 3"  (1.6 m), weight 79.3 kg (174 lb 13.2 oz), last menstrual period 02/13/2014, SpO2 99 %.Body mass index is 30.98 kg/(m^2). Results for orders placed or performed during the hospital encounter of 03/24/14 (from the past 72 hour(s))  Hepatitis panel, acute     Status: Abnormal   Collection Time: 03/28/14 10:00 AM  Result Value Ref Range   Hepatitis B Surface Ag NEGATIVE NEGATIVE   HCV Ab Reactive (A) NEGATIVE   Hep A IgM NON REACTIVE NON REACTIVE    Comment: (NOTE) Effective January 23, 2014, Hepatitis Acute Panel (test code [phone]) will be revised to automatically reflex to the Hepatitis C Viral RNA, Quantitative, Real-Time PCR assay if the Hepatitis C antibody screening result is Reactive. This action is being taken to ensure that the CDC/USPSTF recommended HCV diagnostic algorithm with the appropriate test reflex needed for accurate interpretation is followed.    Hep B C IgM NON REACTIVE NON REACTIVE    Comment: (NOTE) High  levels of Hepatitis B Core IgM antibody are detectable during the acute stage of Hepatitis B. This antibody is used to differentiate current from past HBV infection. Performed at [facility] RNA quant     Status: Abnormal   Collection Time: 03/28/14 10:00 AM  Result Value Ref Range   HCV Quantitative 7310 (H) <15 IU/mL   HCV Quantitative Log 3.86 (H) <1.18 log 10    Comment: (NOTE) This test utilizes the [place] FDA approved Roche HCV Test Kit by RT-PCR. Performed at [facility] are reviewed and are pertinent for positive for cocaine and benzodiazepines and THC.  Current Facility-Administered Medications  Medication Dose Route Frequency Provider Last Rate Last Dose  . acetaminophen (TYLENOL) tablet 650 mg  650 mg Oral Q6H PRN Theressa Millard, MD   650 mg at 03/28/14 1950   Or  . acetaminophen (TYLENOL) suppository 650 mg  650 mg Rectal Q6H PRN Theressa Millard, MD      . acetaminophen (TYLENOL) tablet 650 mg  650 mg Oral Q4H  PRN Patty Sermons Camprubi-Soms, PA-C   650 mg at 03/30/14 6073  . alum & mag hydroxide-simeth (MAALOX/MYLANTA) 200-200-20 MG/5ML suspension 30 mL  30 mL Oral PRN Mercedes Strupp Camprubi-Soms, PA-C      . alum & mag hydroxide-simeth (MAALOX/MYLANTA) 200-200-20 MG/5ML suspension 30 mL  30 mL Oral Q6H PRN Theressa Millard, MD      . celecoxib (CELEBREX) capsule 200 mg  200 mg Oral BID WC Mercedes Strupp Camprubi-Soms, PA-C   200 mg at 03/30/14 0800  . clonazePAM (KLONOPIN) tablet 1 mg  1 mg Oral TID Lacy Duverney, PA-C   1 mg at 03/30/14 0943  . cloNIDine (CATAPRES - Dosed in mg/24 hr) patch 0.1 mg  0.1 mg Transdermal Q Mon Ripudeep K Rai, MD   0.1 mg at 03/27/14 2151  . docusate sodium (COLACE) capsule 200 mg  200 mg Oral Daily PRN Gardiner Barefoot, NP      . enoxaparin (LOVENOX) injection 40 mg  40 mg Subcutaneous Q24H Theressa Millard, MD   40 mg at 03/30/14 0945  . gabapentin (NEURONTIN) capsule 600 mg  600 mg Oral QID  Mercedes Strupp Camprubi-Soms, PA-C   600 mg at 03/30/14 0943  . HYDROmorphone (DILAUDID) injection 0.5-1 mg  0.5-1 mg Intravenous Q3H PRN Theressa Millard, MD      . hydrOXYzine (ATARAX/VISTARIL) tablet 25 mg  25 mg Oral Q6H PRN Mercedes Strupp Camprubi-Soms, PA-C   25 mg at 03/30/14 0806  . ibuprofen (ADVIL,MOTRIN) tablet 600 mg  600 mg Oral Q8H PRN Mercedes Strupp Camprubi-Soms, PA-C   600 mg at 03/30/14 0045  . lamoTRIgine (LAMICTAL) tablet 25 mg  25 mg Oral BID Durward Parcel, MD   25 mg at 03/30/14 0943  . LORazepam (ATIVAN) tablet 1 mg  1 mg Oral Q8H PRN Mercedes Strupp Camprubi-Soms, PA-C   1 mg at 03/30/14 0041  . meclizine (ANTIVERT) tablet 25 mg  25 mg Oral TID PRN Mercedes Strupp Camprubi-Soms, PA-C   25 mg at 03/29/14 0151  . methadone (DOLOPHINE) tablet 40 mg  40 mg Oral Daily Ripudeep Krystal Eaton, MD   40 mg at 03/30/14 0943  . metroNIDAZOLE (FLAGYL) tablet 500 mg  500 mg Oral Q12H Mercedes Strupp Camprubi-Soms, PA-C   500 mg at 03/30/14 0943  . mirtazapine (REMERON) tablet 7.5 mg  7.5 mg Oral QHS Mercedes Strupp Camprubi-Soms, PA-C   7.5 mg at 03/28/14 2311  . nicotine (NICODERM CQ - dosed in mg/24 hours) patch 21 mg  21 mg Transdermal Daily Mercedes Strupp Camprubi-Soms, PA-C   21 mg at 03/30/14 0945  . OLANZapine (ZYPREXA) tablet 15 mg  15 mg Oral QHS Mercedes Strupp Camprubi-Soms, PA-C   15 mg at 03/29/14 2120  . ondansetron (ZOFRAN) tablet 4 mg  4 mg Oral Q6H PRN Theressa Millard, MD   4 mg at 03/27/14 7106   Or  . ondansetron (ZOFRAN) injection 4 mg  4 mg Intravenous Q6H PRN Theressa Millard, MD      . ondansetron [facility]) tablet 4 mg  4 mg Oral Q8H PRN Mercedes Strupp Camprubi-Soms, PA-C   4 mg at 03/24/14 1949  . oxyCODONE (Oxy IR/ROXICODONE) immediate release tablet 5 mg  5 mg Oral Q4H PRN Theressa Millard, MD   5 mg at 03/30/14 0943  . polyethylene glycol (MIRALAX / GLYCOLAX) packet 17 g  17 g Oral Daily PRN Gardiner Barefoot, NP      . zolpidem (AMBIEN)  tablet 5 mg  5 mg Oral QHS PRN Mercedes Strupp Camprubi-Soms, PA-C   5 mg at 03/29/14 2120    Psychiatric Specialty Exam:     Blood pressure 100/56, pulse 112, temperature 97.6 F (36.4 C), temperature source Oral, resp. rate 20, height 5' 3"  (1.6 m), weight 79.3 kg (174 lb 13.2 oz), last menstrual period 02/13/2014, SpO2 99 %.Body mass index is 30.98 kg/(m^2).  General Appearance: Casual and Disheveled  Eye Contact::  Fair  Speech:  Pressured  Volume:  Normal  Mood:  Fairly good  Affect:  brighter  Thought Process:  wnls  Orientation:  Full (Time, Place, and Person)  Thought Content:  Rumination  Suicidal Thoughts:  No  Homicidal Thoughts:  No  Memory:  Immediate;   Fair Recent;   Fair Remote;   Fair  Judgement:  Poor  Insight:  Lacking  Psychomotor Activity:  Normal  Concentration:  Fair  Recall:  [employer] of Knowledge:Poor  Language: Good  Akathisia:  No  Handed:  Right  AIMS (if indicated):     Assets:  Communication Skills Desire for Improvement Social Support  Sleep:      Musculoskeletal: Strength & Muscle Tone: within normal limits Gait & Station: normal Patient leans: N/A  Treatment Plan Summary: Daily contact with patient to assess and evaluate symptoms and progress in treatment Medication management Patient denies being suicidal and declines admission to the behavioral health hospital.  She has an ACTT team and a new psychiatrist at [facility] care. Rescind  IVC and discontinue safety sitter Continue Clonazepam 1 mg PO TID and than BID   Arlene Genova,JANARDHAHA R. 03/30/2014 10:59 AM

## 2014-03-30 NOTE — Discharge Summary (Signed)
Physician Discharge Summary  [name] ZOX:[id number] DOB: 04/02/1985 DOA: 03/24/2014  PCP: No primary care provider on file.  Admit date: 03/24/2014 Discharge date: 03/30/2014  Time spent: 45 minutes  Recommendations for Outpatient Follow-up:                                                                   1. Follow up with Psychiatrist for readjustment of Klonopin, reassessment of other psychiatric issues 2. Discharge to [facility]   Discharge Diagnoses:  Principal Problem:   Benzodiazepine withdrawal Active Problems:   Withdrawal seizures   Polysubstance abuse   Depression   Hepatitis C   Suicidal ideation   Methadone maintenance therapy patient   Substance abuse   Discharge Condition: Stable  Diet recommendation: Regular diet  Filed Weights   03/25/14 0445  Weight: 79.3 kg (174 lb 13.2 oz)    History of present illness:   [name] is a 29 year old female with past medical history of polysubstance abuse, bipolar disorder, depression, anxiety, presented to the ED with suicidal ideation with plan, auditory hallucinations, and wanting detox treatment.  She admits to taking Xanax  12 times a day for the past 3 weeks.  She reported increased anxiety and stress and wanting to kill herself as she lost custody of her daughter who is now adopted.  She staets because she lost her Medicaid, she was getting Xanax off the streets.  She reports that she also goes to Methadone clinic and is now on 40 mg daily.  In ED, patient suffered a tonic-clonic seizure witnessed by staff that lasted for 1 minute.  UDS positive for benzodiazepine, cocaine, and THC.  She is admitted for further management of withdrawal seizure.   Hospital Course:   Benzodiazepine overdose -Patient admits to taking 12 mg Xanax daily for the past 3 weeks, and and came in with suicidal ideation with plan to kill herself by overdosing. Psychiatry was consulted and was following the patient.  She  was slowly tapered of Xanax, and transitioned to Klonopin 1 mg 3 times a day.  She was repeatedly stated that she wanted to leave AMA, and due to risk of withdrawal seizures, she was IVC'ed.  Psychiatry with recommendation for inpatient psych when medically cleared.  Patient is discharged to behavior health clinic, where Klonopin frequency will be further adjusted.  Withdrawal seizures -Patient with one episode of witnessed seizure in the ED, no recurrence of seizure during hospitalization.  She was maintained on Ativan as needed, and Xanax was slowly tapered off to prevent further recurrence. -discharge to health clinic on Klonopin 1 mg 3 times a day  Polysubstance abuse -UDS positive for benzodiazepine, cocaine, THC -Counseled on cessation, patient will be admitted to inpatient psych facility.  Depression -Patient presented with suicidal ideation with plan to overdose on Xanax.  Patient denies suicidal/homicidal ideations on discharge   Methadone maintenance therapy patient -Continue methadone  daily.  Bacterial Vaginosis Wet prep with evidence of clue cells.  She was administered a single dose of Rocephin, azithromycin, and metronidazole. -continue metronidazole 500 MG every 12 for 2 days.  Bipolar disorder Stable on discharge.  Continue Olanzapine, Remeron  Chronic Hepatitis C  Tobacco abuse Patient was maintained on NicoDerm patch.  Counseled on cessation.  Procedures: None  Consultations:  Psychiatry  Discharge Exam: Filed Vitals:   03/30/14 1117  BP: 98/56  Pulse: 103  Temp: 98.1 F (36.7 C)  Resp: 18     Exam General: Alert Caucasian female in NAD. Eyes: Anicteric account.  Cardiovascular: Regular rate and rhythm.  No murmurs, rubs, or gallops. Respiratory: Clear to auscultate bilaterally.  No rhonchi or crepitations. Abdomen: Soft nontender bowel sounds present. No guarding or rigidity.  Musculoskeletal: No edema.  Psychiatric: agitated,  anxious Neurologic: Alert awake oriented to time place and person.    Discharge Instructions       Discharge Instructions    Diet general    Complete by:  As directed      Increase activity slowly    Complete by:  As directed             Medication List    TAKE these medications        ALPRAZolam 0.5 MG tablet  Commonly known as:  XANAX  Take 0.5 mg by mouth at bedtime as needed for anxiety.     celecoxib 200 MG capsule  Commonly known as:  CELEBREX  Take 1 capsule (200 mg total) by mouth 2 (two) times daily.     clonazePAM 1 MG tablet  Commonly known as:  KLONOPIN  Take 1 tablet (1 mg total) by mouth 3 (three) times daily.     cloNIDine 0.1 mg/24hr patch  Commonly known as:  CATAPRES - Dosed in mg/24 hr  Place 1 patch (0.1 mg total) onto the skin once a week.     gabapentin 300 MG capsule  Commonly known as:  NEURONTIN  Take 2 capsules (600 mg total) by mouth 4 (four) times daily.     hydrOXYzine 25 MG tablet  Commonly known as:  ATARAX/VISTARIL  Take 1 tablet (25 mg total) by mouth every 6 (six) hours as needed for anxiety.     lamoTRIgine 25 MG tablet  Commonly known as:  LAMICTAL  Take 1 tablet (25 mg total) by mouth daily.     meclizine 25 MG tablet  Commonly known as:  ANTIVERT  Take 1 tablet (25 mg total) by mouth 3 (three) times daily as needed for dizziness or nausea.     mirtazapine 7.5 MG tablet  Commonly known as:  REMERON  Take 1 tablet (7.5 mg total) by mouth at bedtime.     nicotine 21 mg/24hr patch  Commonly known as:  NICODERM CQ - dosed in mg/24 hours  Place 1 patch (21 mg total) onto the skin daily at 6 (six) AM.     OLANZapine 15 MG tablet  Commonly known as:  ZYPREXA  Take 1 tablet (15 mg total) by mouth at bedtime.     polyethylene glycol packet  Commonly known as:  MIRALAX / GLYCOLAX  Take 17 g by mouth 2 (two) times daily. Hold if you develop diarrhea     senna-docusate 8.6-50 MG per tablet  Commonly known as:  Senokot-S   Take 1 tablet by mouth at bedtime. Hold if you develop diarrhea       Allergies  Allergen Reactions  . Narcan [Naloxone] Other (See Comments) and Itching    Violent withdrawal symptoms; interferes with methadone Interferes with methadone and causes withdrawal   . Suboxone [Buprenorphine Hcl-Naloxone Hcl] Other (See Comments) and Itching    Violent withdrawal symptoms; interferes with methadone Interferes with methadone and causes withdrawal    Follow-up Information    Follow  [facility] with [facility]             .   Contact information:   [address] [place] [address]        The results of significant diagnostics from this hospitalization (including imaging, microbiology, ancillary and laboratory) are listed below for reference.    Significant Diagnostic Studies: US Transvaginal Non-ob  2014/04/08   CLINICAL DATA:  Vaginal discharge, pelvic pain. Concern for pelvic inflammatory disease or tubo-ovarian abscess.  EXAM: TRANSABDOMINAL AND TRANSVAGINAL ULTRASOUND OF PELVIS  DOPPLER ULTRASOUND OF OVARIES  TECHNIQUE: Both transabdominal and transvaginal ultrasound examinations of the pelvis were performed. Transabdominal technique was performed for global imaging of the pelvis including uterus, ovaries, adnexal regions, and pelvic cul-de-sac.  It was necessary to proceed with endovaginal exam following the transabdominal exam to visualize the uterus and ovaries. Color and duplex Doppler ultrasound was utilized to evaluate blood flow to the ovaries.  COMPARISON:  CT 05/05/2006  FINDINGS: Uterus  Measurements: Normal in size at 10.0 x 5.0 x 6.4 cm.  Endometrium  Thickness: Normal in thickness for premenopausal female at 13 mm.  Right ovary  Measurements: Normal in size at 3.1 x 2.4 x 3.5 cm. Normal color Doppler flow and spectral waveforms.  Left ovary  Measurements: Normal in size at 2.9 x 3.1 x 2.6 cm. There is a dominant follicle  measuring 2.2 cm. Normal color Doppler flow and spectral waveforms.  Pulsed Doppler evaluation of both ovaries demonstrates normal low-resistance arterial and venous waveforms.  Other findings  Small volume free fluid .  IMPRESSION: 1. Normal ovaries without evidence of torsion. 2. Normal uterus.   Electronically Signed   By: Genevive Bi M.D.   On: 2014-04-08 15:17   US Pelvis Complete  04/08/2014   CLINICAL DATA:  Vaginal discharge, pelvic pain. Concern for pelvic inflammatory disease or tubo-ovarian abscess.  EXAM: TRANSABDOMINAL AND TRANSVAGINAL ULTRASOUND OF PELVIS  DOPPLER ULTRASOUND OF OVARIES  TECHNIQUE: Both transabdominal and transvaginal ultrasound examinations of the pelvis were performed. Transabdominal technique was performed for global imaging of the pelvis including uterus, ovaries, adnexal regions, and pelvic cul-de-sac.  It was necessary to proceed with endovaginal exam following the transabdominal exam to visualize the uterus and ovaries. Color and duplex Doppler ultrasound was utilized to evaluate blood flow to the ovaries.  COMPARISON:  CT 05/05/2006  FINDINGS: Uterus  Measurements: Normal in size at 10.0 x 5.0 x 6.4 cm.  Endometrium  Thickness: Normal in thickness for premenopausal female at 13 mm.  Right ovary  Measurements: Normal in size at 3.1 x 2.4 x 3.5 cm. Normal color Doppler flow and spectral waveforms.  Left ovary  Measurements: Normal in size at 2.9 x 3.1 x 2.6 cm. There is a dominant follicle measuring 2.2 cm. Normal color Doppler flow and spectral waveforms.  Pulsed Doppler evaluation of both ovaries demonstrates normal low-resistance arterial and venous waveforms.  Other findings  Small volume free fluid .  IMPRESSION: 1. Normal ovaries without evidence of torsion. 2. Normal uterus.   Electronically Signed   By: Genevive Bi M.D.   On: 08-Apr-2014 15:17   [place] Art/ven Flow Abd Pelv Doppler  Apr 08, 2014   CLINICAL DATA:  Vaginal discharge, pelvic pain. Concern for pelvic  inflammatory disease or tubo-ovarian abscess.  EXAM: TRANSABDOMINAL AND TRANSVAGINAL ULTRASOUND OF PELVIS  DOPPLER ULTRASOUND OF OVARIES  TECHNIQUE: Both transabdominal and transvaginal ultrasound examinations of the pelvis were performed. Transabdominal technique was performed for  global imaging of the pelvis including uterus, ovaries, adnexal regions, and pelvic cul-de-sac.  It was necessary to proceed with endovaginal exam following the transabdominal exam to visualize the uterus and ovaries. Color and duplex Doppler ultrasound was utilized to evaluate blood flow to the ovaries.  COMPARISON:  CT 05/05/2006  FINDINGS: Uterus  Measurements: Normal in size at 10.0 x 5.0 x 6.4 cm.  Endometrium  Thickness: Normal in thickness for premenopausal female at 13 mm.  Right ovary  Measurements: Normal in size at 3.1 x 2.4 x 3.5 cm. Normal color Doppler flow and spectral waveforms.  Left ovary  Measurements: Normal in size at 2.9 x 3.1 x 2.6 cm. There is a dominant follicle measuring 2.2 cm. Normal color Doppler flow and spectral waveforms.  Pulsed Doppler evaluation of both ovaries demonstrates normal low-resistance arterial and venous waveforms.  Other findings  Small volume free fluid .  IMPRESSION: 1. Normal ovaries without evidence of torsion. 2. Normal uterus.   Electronically Signed   By: Genevive Bi M.D.   On: 03/24/2014 15:17    Microbiology: Recent Results (from the past 240 hour(s))  Wet prep, genital     Status: Abnormal   Collection Time: 03/24/14 12:44 PM  Result Value Ref Range Status   Yeast Wet Prep HPF POC NONE SEEN NONE SEEN Final   Trich, Wet Prep NONE SEEN NONE SEEN Final   Clue Cells Wet Prep HPF POC MODERATE (A) NONE SEEN Final   WBC, Wet Prep HPF POC FEW (A) NONE SEEN Final  MRSA PCR Screening     Status: None   Collection Time: 03/25/14  4:41 AM  Result Value Ref Range Status   MRSA by PCR NEGATIVE NEGATIVE Final    Comment:        The GeneXpert MRSA Assay (FDA approved for  NASAL specimens only), is one component of a comprehensive MRSA colonization surveillance program. It is not intended to diagnose MRSA infection nor to guide or monitor treatment for MRSA infections.      Labs: Basic Metabolic Panel:  Recent Labs Lab 03/24/14 1121 03/25/14 1200  NA 137 139  K 3.5 4.0  CL 106 106  CO2 23 24  GLUCOSE 88 95  BUN 15 17  CREATININE 0.65 0.60  CALCIUM 9.0 9.1   Liver Function Tests:  Recent Labs Lab 03/24/14 1121  AST 28  ALT 27  ALKPHOS 54  BILITOT 0.4  PROT 7.3  ALBUMIN 4.0   No results for input(s): LIPASE, AMYLASE in the last 168 hours. No results for input(s): AMMONIA in the last 168 hours. CBC:  Recent Labs Lab 03/24/14 1121 03/25/14 1200  WBC 5.8 3.4*  NEUTROABS 4.1  --   HGB 11.7* 11.2*  HCT 32.7* 32.0*  MCV 84.3 85.1  PLT 221 194   Cardiac Enzymes: No results for input(s): CKTOTAL, CKMB, CKMBINDEX, TROPONINI in the last 168 hours. BNP: BNP (last 3 results) No results for input(s): PROBNP in the last 8760 hours. CBG:  Recent Labs Lab 03/24/14 1833  GLUCAP 106*       Signed:  [name] PA-C  [facility] 03/30/2014, 11:30 AM

## 2014-04-06 ENCOUNTER — Emergency Department (INDEPENDENT_AMBULATORY_CARE_PROVIDER_SITE_OTHER)
Admission: EM | Admit: 2014-04-06 | Discharge: 2014-04-06 | Disposition: A | Discharge status: Home / Self Care | Payer: Self-pay | Source: Home / Self Care

## 2014-04-06 ENCOUNTER — Encounter (HOSPITAL_COMMUNITY): Payer: Self-pay | Admitting: Emergency Medicine

## 2014-04-06 DIAGNOSIS — Z760 Encounter for issue of repeat prescription: Secondary | ICD-10-CM

## 2014-04-06 MED ORDER — CLONAZEPAM 1 MG PO TABS
1.0000 mg | ORAL_TABLET | Freq: Three times a day (TID) | ORAL | Status: DC
Start: 2014-04-06 — End: 2014-04-13

## 2014-04-06 NOTE — ED Provider Notes (Addendum)
CSN: [id number]     Arrival date & time 04/06/14  1256 History   None    Chief Complaint  Patient presents with  . Medication Refill   (Consider location/radiation/quality/duration/timing/severity/associated sxs/prior Treatment) HPI  PT reports being discharged after being in the hospital last week for seizures from medication withdrawal. Pt reports being sent home with enough medicine to make it to her appointment today, but her appointment is actually scheduled for 04/13/14. Pt also w/ appt w/ Neuro on 04/30/14.  No seizures since leaving the hospital. Taking 3, 1mg  Klonopin daily. Stopped taking Lamictal prior to last admission.      Past Medical History  Diagnosis Date  . Drug abuse   . GERD (gastroesophageal reflux disease)   . Depression   . Arthritis   . Seizures   . Chronic pain   . Methadone maintenance therapy patient   . Anxiety    Past Surgical History  Procedure Laterality Date  . Appendectomy    . Cesarean section    . Mandible fracture surgery     Family History  Problem Relation Age of Onset  . Mental illness Mother   . Mental illness Brother   . Diabetes Maternal Grandmother   . Mental illness Maternal Grandmother   . Hypertension Maternal Grandfather   . Diabetes Paternal Grandmother   . Heart disease Paternal Grandmother    History  Substance Use Topics  . Smoking status: Current Some Day Smoker -- 1.00 packs/day for 5 years    Last Attempt to Quit: 01/13/2011  . Smokeless tobacco: Never Used  . Alcohol Use: No     Comment: Quit three months ago   OB History    Gravida Para Term Preterm AB TAB SAB Ectopic Multiple Living   3 2   1 1          Review of Systems Per HPI with all other pertinent systems negative.   Allergies  Narcan and Suboxone  Home Medications   Prior to Admission medications   Medication Sig Start Date End Date Taking? Authorizing Provider  ALPRAZolam Prudy Feeler(XANAX) 0.5 MG tablet Take 0.5 mg by mouth at bedtime as needed for  anxiety.   Yes Historical Provider, MD  celecoxib (CELEBREX) 200 MG capsule Take 1 capsule (200 mg total) by mouth 2 (two) times daily. 03/07/14  Yes Velna HatchetSheila May Agustin, NP  cloNIDine (CATAPRES - DOSED IN MG/24 HR) 0.1 mg/24hr patch Place 1 patch (0.1 mg total) onto the skin once a week. 03/07/14  Yes Velna HatchetSheila May Agustin, NP  gabapentin (NEURONTIN) 300 MG capsule Take 2 capsules (600 mg total) by mouth 4 (four) times daily. 03/07/14  Yes Velna HatchetSheila May Agustin, NP  hydrOXYzine (ATARAX/VISTARIL) 25 MG tablet Take 1 tablet (25 mg total) by mouth every 6 (six) hours as needed for anxiety. 03/07/14  Yes Velna HatchetSheila May Agustin, NP  methadone (DOLOPHINE) 10 MG tablet Take 9 mg by mouth daily.   Yes Historical Provider, MD  mirtazapine (REMERON) 7.5 MG tablet Take 1 tablet (7.5 mg total) by mouth at bedtime. 03/07/14  Yes Velna HatchetSheila May Agustin, NP  nicotine (NICODERM CQ - DOSED IN MG/24 HOURS) 21 mg/24hr patch Place 1 patch (21 mg total) onto the skin daily at 6 (six) AM. 03/07/14  Yes Velna HatchetSheila May Agustin, NP  OLANZapine (ZYPREXA) 15 MG tablet Take 1 tablet (15 mg total) by mouth at bedtime. 03/07/14  Yes Velna HatchetSheila May Agustin, NP  clonazePAM (KLONOPIN) 1 MG tablet Take 1 tablet (1 mg total) by mouth  3 (three) times daily. 04/06/14   Ozella Rocks, MD  lamoTRIgine (LAMICTAL) 25 MG tablet Take 1 tablet (25 mg total) by mouth daily. 03/07/14   Lindwood Qua, NP  meclizine (ANTIVERT) 25 MG tablet Take 1 tablet (25 mg total) by mouth 3 (three) times daily as needed for dizziness or nausea. 03/07/14   Velna Hatchet May Agustin, NP  metFORMIN (GLUCOPHAGE) 500 MG tablet Take 500 mg by mouth 3 (three) times daily before meals.    Historical Provider, MD  polyethylene glycol (MIRALAX / GLYCOLAX) packet Take 17 g by mouth 2 (two) times daily. Hold if you develop diarrhea Patient not taking: Reported on 03/24/2014 03/07/14   Velna Hatchet May Agustin, NP  senna-docusate (SENOKOT-S) 8.6-50 MG per tablet Take 1 tablet by mouth at bedtime. Hold  if you develop diarrhea Patient not taking: Reported on 03/24/2014 03/07/14   Velna Hatchet May Agustin, NP   BP 110/54 mmHg  Pulse 80  Temp(Src) 99.2 F (37.3 C) (Oral)  SpO2 98%  LMP 03/28/2014 (Exact Date) Physical Exam  ED Course  Procedures (including critical care time) Labs Review Labs Reviewed - No data to display  Imaging Review No results found.   MDM   1. Medication refill    After further discussion pt admitted to taking more Klonopin than prescribed initially but has tried cutting back with time.   based on previous prescription which was filled on the 21st patient should have enough to make it to the 30th. Patient will be given 5 additional days of Klonopin which should have been sufficient to make it to her appointment on the fourth. The [facility] was called to confirm that she does have an appointment scheduled for the fourth. Lengthy discussion had with patient with regards to medication overuse , weaning off of benzos , continuing her care at the methadone clinic for her opioid addiction. Patient states understanding and will continue to try to wean down on benzodiazepines using as needed to prevent further seizures. Patient stable to return home with prescription and follow-up at the [facility] on the fourth.  Previous notes from hospitalization reviewed.    Precautions given and all questions answered   Shelly Flatten, MD Family Medicine 04/06/2014, 2:45 PM    Ozella Rocks, MD 04/06/14 1445  Ozella Rocks, MD 04/06/14 [phone]
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
## 2014-04-06 NOTE — Discharge Instructions (Signed)
You have significant challenges in front of you but you are doing great Please continue to cut back on your klonopin use.  Remember to try to only use 2 per day Please keep your appointment with the [facility] center on the 4th.

## 2014-04-13 ENCOUNTER — Emergency Department (HOSPITAL_COMMUNITY)
Admission: EM | Admit: 2014-04-13 | Discharge: 2014-04-13 | Disposition: A | Discharge status: Home / Self Care | Payer: Self-pay | Attending: Emergency Medicine | Admitting: Emergency Medicine

## 2014-04-13 ENCOUNTER — Encounter (HOSPITAL_COMMUNITY): Payer: Self-pay

## 2014-04-13 ENCOUNTER — Inpatient Hospital Stay: Payer: Self-pay | Admitting: Internal Medicine

## 2014-04-13 ENCOUNTER — Emergency Department (HOSPITAL_COMMUNITY): Payer: Self-pay

## 2014-04-13 DIAGNOSIS — Y998 Other external cause status: Secondary | ICD-10-CM | POA: Insufficient documentation

## 2014-04-13 DIAGNOSIS — G8929 Other chronic pain: Secondary | ICD-10-CM | POA: Insufficient documentation

## 2014-04-13 DIAGNOSIS — R569 Unspecified convulsions: Secondary | ICD-10-CM

## 2014-04-13 DIAGNOSIS — F419 Anxiety disorder, unspecified: Secondary | ICD-10-CM | POA: Insufficient documentation

## 2014-04-13 DIAGNOSIS — Z79899 Other long term (current) drug therapy: Secondary | ICD-10-CM | POA: Insufficient documentation

## 2014-04-13 DIAGNOSIS — Z760 Encounter for issue of repeat prescription: Secondary | ICD-10-CM | POA: Insufficient documentation

## 2014-04-13 DIAGNOSIS — Y9289 Other specified places as the place of occurrence of the external cause: Secondary | ICD-10-CM | POA: Insufficient documentation

## 2014-04-13 DIAGNOSIS — S0990XA Unspecified injury of head, initial encounter: Secondary | ICD-10-CM | POA: Insufficient documentation

## 2014-04-13 DIAGNOSIS — F329 Major depressive disorder, single episode, unspecified: Secondary | ICD-10-CM | POA: Insufficient documentation

## 2014-04-13 DIAGNOSIS — Z720 Tobacco use: Secondary | ICD-10-CM | POA: Insufficient documentation

## 2014-04-13 DIAGNOSIS — K219 Gastro-esophageal reflux disease without esophagitis: Secondary | ICD-10-CM | POA: Insufficient documentation

## 2014-04-13 DIAGNOSIS — G40909 Epilepsy, unspecified, not intractable, without status epilepticus: Secondary | ICD-10-CM | POA: Insufficient documentation

## 2014-04-13 DIAGNOSIS — R197 Diarrhea, unspecified: Secondary | ICD-10-CM | POA: Insufficient documentation

## 2014-04-13 DIAGNOSIS — M1990 Unspecified osteoarthritis, unspecified site: Secondary | ICD-10-CM | POA: Insufficient documentation

## 2014-04-13 DIAGNOSIS — Y9389 Activity, other specified: Secondary | ICD-10-CM | POA: Insufficient documentation

## 2014-04-13 LAB — BASIC METABOLIC PANEL
Anion gap: 8 (ref 5–15)
BUN: 23 mg/dL (ref 6–23)
CO2: 26 mmol/L (ref 19–32)
Calcium: 10 mg/dL (ref 8.4–10.5)
Chloride: 105 mmol/L (ref 96–112)
Creatinine, Ser: 0.69 mg/dL (ref 0.50–1.10)
GFR calc Af Amer: >90 mL/min (ref >90)
GFR calc non Af Amer: >90 mL/min (ref >90)
Glucose, Bld: 97 mg/dL (ref 70–99)
Potassium: 3.6 mmol/L (ref 3.5–5.1)
Sodium: 139 mmol/L (ref 135–145)

## 2014-04-13 LAB — CBC WITH DIFFERENTIAL/PLATELET
Basophils Absolute: 0 10*3/uL (ref 0.0–0.1)
Basophils Relative: 0 % (ref 0–1)
Eosinophils Absolute: 0 10*3/uL (ref 0.0–0.7)
Eosinophils Relative: 0 % (ref 0–5)
HCT: 34.3 % — ABNORMAL LOW (ref 36.0–46.0)
Hemoglobin: 12.3 g/dL (ref 12.0–15.0)
Lymphocytes Relative: 31 % (ref 12–46)
Lymphs Abs: 2 10*3/uL (ref 0.7–4.0)
MCH: 29.5 pg (ref 26.0–34.0)
MCHC: 35.9 g/dL (ref 30.0–36.0)
MCV: 82.3 fL (ref 78.0–100.0)
Monocytes Absolute: 0.4 10*3/uL (ref 0.1–1.0)
Monocytes Relative: 6 % (ref 3–12)
Neutro Abs: 4 10*3/uL (ref 1.7–7.7)
Neutrophils Relative %: 63 % (ref 43–77)
Platelets: 294 10*3/uL (ref 150–400)
RBC: 4.17 MIL/uL (ref 3.87–5.11)
RDW: 12.3 % (ref 11.5–15.5)
WBC: 6.5 10*3/uL (ref 4.0–10.5)

## 2014-04-13 MED ORDER — CLONAZEPAM 1 MG PO TABS: 1.0000 mg | ORAL_TABLET | Freq: Three times a day (TID) | ORAL | Status: DC

## 2014-04-13 MED ORDER — GABAPENTIN 300 MG PO CAPS: 600.0000 mg | ORAL_CAPSULE | Freq: Three times a day (TID) | ORAL | Status: DC

## 2014-04-13 MED ORDER — CLONAZEPAM 0.5 MG PO TABS
1.0000 mg | ORAL_TABLET | Freq: Once | ORAL | Status: AC
  Administered 2014-04-13: 1 mg via ORAL
  Filled 2014-04-13: qty 2

## 2014-04-13 MED ORDER — GABAPENTIN 300 MG PO CAPS
600.0000 mg | ORAL_CAPSULE | Freq: Once | ORAL | Status: AC
  Administered 2014-04-13: 600 mg via ORAL
  Filled 2014-04-13: qty 2
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
## 2014-04-13 NOTE — ED Notes (Signed)
Patient returned from CT

## 2014-04-13 NOTE — ED Notes (Signed)
Patient transported to x-ray. ?
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
## 2014-04-13 NOTE — ED Notes (Signed)
Given sandwich and gingerale. Finished both. Tolerated well.

## 2014-04-13 NOTE — ED Notes (Signed)
Patient transported to CT 

## 2014-04-13 NOTE — Progress Notes (Signed)
[facility]/14 @1030 

## 2014-04-14 ENCOUNTER — Telehealth: Payer: Self-pay | Admitting: Internal Medicine

## 2014-04-14 NOTE — Telephone Encounter (Signed)
Nurse would like to share information on this pt and does have a release signed by her to share information. Please follow up with nurse. Patient will be seen on Monday but does not have a PCP yet.

## 2014-04-17 ENCOUNTER — Encounter: Payer: Self-pay | Admitting: Physician Assistant

## 2014-04-17 ENCOUNTER — Ambulatory Visit: Payer: MEDICAID | Attending: Internal Medicine | Admitting: Physician Assistant

## 2014-04-17 VITALS — BP 109/75 | HR 61 | Temp 97.8°F | Resp 20 | Ht 65.0 in | Wt 170.0 lb

## 2014-04-17 DIAGNOSIS — F419 Anxiety disorder, unspecified: Secondary | ICD-10-CM | POA: Insufficient documentation

## 2014-04-17 DIAGNOSIS — F1310 Sedative, hypnotic or anxiolytic abuse, uncomplicated: Secondary | ICD-10-CM

## 2014-04-17 DIAGNOSIS — F17200 Nicotine dependence, unspecified, uncomplicated: Secondary | ICD-10-CM

## 2014-04-17 DIAGNOSIS — F1320 Sedative, hypnotic or anxiolytic dependence, uncomplicated: Secondary | ICD-10-CM | POA: Insufficient documentation

## 2014-04-17 DIAGNOSIS — G8929 Other chronic pain: Secondary | ICD-10-CM | POA: Insufficient documentation

## 2014-04-17 DIAGNOSIS — Z720 Tobacco use: Secondary | ICD-10-CM

## 2014-04-17 DIAGNOSIS — K219 Gastro-esophageal reflux disease without esophagitis: Secondary | ICD-10-CM | POA: Insufficient documentation

## 2014-04-17 DIAGNOSIS — R569 Unspecified convulsions: Secondary | ICD-10-CM

## 2014-04-17 DIAGNOSIS — Z79899 Other long term (current) drug therapy: Secondary | ICD-10-CM | POA: Insufficient documentation

## 2014-04-17 DIAGNOSIS — F329 Major depressive disorder, single episode, unspecified: Secondary | ICD-10-CM | POA: Insufficient documentation

## 2014-04-17 MED ORDER — LAMOTRIGINE 25 MG PO TABS: 25.0000 mg | ORAL_TABLET | Freq: Every day | ORAL | Status: DC

## 2014-04-17 MED ORDER — GABAPENTIN 300 MG PO CAPS: 600.0000 mg | ORAL_CAPSULE | Freq: Three times a day (TID) | ORAL | Status: DC

## 2014-04-17 MED ORDER — CLONAZEPAM 1 MG PO TABS: 1.0000 mg | ORAL_TABLET | Freq: Three times a day (TID) | ORAL | Status: DC
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
## 2014-04-17 NOTE — Progress Notes (Signed)
Pt presents to clinic med refill, clonopin and Neurontin. Pt states she feels depressed, she loss her daughter recently. Denies SI/HI.

## 2014-04-18 ENCOUNTER — Telehealth: Payer: Self-pay | Admitting: *Deleted

## 2014-04-18 NOTE — Telephone Encounter (Signed)
Nurse from [place]  Call to notified PCP,  Pt is seen at [facility] Clinic. If any question regarding pt, please call at [phone]

## 2014-04-25 ENCOUNTER — Telehealth: Payer: Self-pay | Admitting: Internal Medicine

## 2014-04-25 ENCOUNTER — Telehealth: Payer: Self-pay

## 2014-04-25 NOTE — Telephone Encounter (Signed)
Pt is calling requesting medication refill on clonazePAM (KLONOPIN) 1 MG tablet. Patient does not have PCP. Please f/u with pt.

## 2014-04-25 NOTE — Telephone Encounter (Signed)
Patient called to request a med refill for clonazePAM (KLONOPIN) 1 MG tablet. Patient will be running out of medications on 04/27/2014 and she states that when she doesnt take the medications she has seizures. Please f/u with patient.

## 2014-04-25 NOTE — Telephone Encounter (Signed)
Pt is calling requesting medication refill on clonazePAM (KLONOPIN) 1 MG tablet. Patient does not have PCP. Please f/u with pt. 

## 2014-04-25 NOTE — Telephone Encounter (Signed)
Patient called requesting a refill on her Klonopin Per Dr Orpah CobbAdvani patient was referred to behavior health and we will not refill this medication  Patient just received a prescription on  04/17/14 #30

## 2014-04-27 ENCOUNTER — Telehealth: Payer: Self-pay | Admitting: *Deleted

## 2014-04-27 ENCOUNTER — Telehealth: Payer: Self-pay | Admitting: Internal Medicine

## 2014-04-27 ENCOUNTER — Other Ambulatory Visit: Payer: Self-pay | Admitting: Internal Medicine

## 2014-04-27 ENCOUNTER — Emergency Department (HOSPITAL_COMMUNITY)
Admission: EM | Admit: 2014-04-27 | Discharge: 2014-04-27 | Disposition: A | Discharge status: Home / Self Care | Payer: Self-pay | Attending: Emergency Medicine | Admitting: Emergency Medicine

## 2014-04-27 ENCOUNTER — Encounter (HOSPITAL_COMMUNITY): Payer: Self-pay | Admitting: *Deleted

## 2014-04-27 DIAGNOSIS — F329 Major depressive disorder, single episode, unspecified: Secondary | ICD-10-CM | POA: Insufficient documentation

## 2014-04-27 DIAGNOSIS — Z791 Long term (current) use of non-steroidal anti-inflammatories (NSAID): Secondary | ICD-10-CM | POA: Insufficient documentation

## 2014-04-27 DIAGNOSIS — G8929 Other chronic pain: Secondary | ICD-10-CM | POA: Insufficient documentation

## 2014-04-27 DIAGNOSIS — Z720 Tobacco use: Secondary | ICD-10-CM | POA: Insufficient documentation

## 2014-04-27 DIAGNOSIS — Z79899 Other long term (current) drug therapy: Secondary | ICD-10-CM | POA: Insufficient documentation

## 2014-04-27 DIAGNOSIS — Z8719 Personal history of other diseases of the digestive system: Secondary | ICD-10-CM | POA: Insufficient documentation

## 2014-04-27 DIAGNOSIS — Z760 Encounter for issue of repeat prescription: Secondary | ICD-10-CM

## 2014-04-27 DIAGNOSIS — F419 Anxiety disorder, unspecified: Secondary | ICD-10-CM | POA: Insufficient documentation

## 2014-04-27 DIAGNOSIS — M1990 Unspecified osteoarthritis, unspecified site: Secondary | ICD-10-CM | POA: Insufficient documentation

## 2014-04-27 DIAGNOSIS — G40909 Epilepsy, unspecified, not intractable, without status epilepticus: Secondary | ICD-10-CM | POA: Insufficient documentation

## 2014-04-27 MED ORDER — GABAPENTIN 300 MG PO CAPS: 600.0000 mg | ORAL_CAPSULE | Freq: Three times a day (TID) | ORAL | Status: DC

## 2014-04-27 MED ORDER — CLONIDINE HCL 0.1 MG/24HR TD PTWK: 0.1000 mg | MEDICATED_PATCH | TRANSDERMAL | Status: DC

## 2014-04-27 MED ORDER — HYDROXYZINE HCL 25 MG PO TABS: 25.0000 mg | ORAL_TABLET | Freq: Four times a day (QID) | ORAL | Status: DC | PRN

## 2014-04-27 MED ORDER — LAMOTRIGINE 25 MG PO TABS: 25.0000 mg | ORAL_TABLET | Freq: Every day | ORAL | Status: DC

## 2014-04-27 MED ORDER — OLANZAPINE 15 MG PO TABS: 15.0000 mg | ORAL_TABLET | Freq: Every day | ORAL | Status: DC

## 2014-04-27 MED ORDER — MIRTAZAPINE 7.5 MG PO TABS: 7.5000 mg | ORAL_TABLET | Freq: Every day | ORAL | Status: DC

## 2014-04-27 MED ORDER — METFORMIN HCL 500 MG PO TABS: 500.0000 mg | ORAL_TABLET | Freq: Two times a day (BID) | ORAL | Status: DC

## 2014-04-27 NOTE — ED Provider Notes (Signed)
CSN: [id number]     Arrival date & time 04/27/14  0940 History  This chart was scribed for non-physician practitioner Jinny Sanders, PA-C, working with Juliet Rude. Rubin Payor, MD by Littie Deeds, ED Scribe. This patient was seen in room TR09C/TR09C and the patient's care was started at 10:20 AM.      Chief Complaint  Patient presents with  . Anxiety  . Medication Refill   The history is provided by the patient. No language interpreter was used.   HPI Comments: [name] is a 29 y.o. female with a hx of anxiety who presents to the Emergency Department for a medication refill for clonazepam. Patient states that she will run out of her medication today and that she has been taking it as prescribed. She states that she has seizures when she runs out of her medication. However, she states that she was unable to get an appointment today after calling and leaving messages. Patient states she lost her Medicaid and can no longer see her psychiatrist. She states she does not have a car. Patient denies having any medical complaints.  PCP: at [facility]  Past Medical History  Diagnosis Date  . Drug abuse   . GERD (gastroesophageal reflux disease)   . Depression   . Arthritis   . Seizures   . Chronic pain   . Methadone maintenance therapy patient   . Anxiety    Past Surgical History  Procedure Laterality Date  . Appendectomy    . Cesarean section    . Mandible fracture surgery     Family History  Problem Relation Age of Onset  . Mental illness Mother   . Mental illness Brother   . Diabetes Maternal Grandmother   . Mental illness Maternal Grandmother   . Hypertension Maternal Grandfather   . Diabetes Paternal Grandmother   . Heart disease Paternal Grandmother    History  Substance Use Topics  . Smoking status: Current Some Day Smoker -- 1.00 packs/day for 5 years    Last Attempt to Quit: 01/13/2011  . Smokeless tobacco: Never Used  . Alcohol Use: No      Comment: Quit three months ago   OB History    Gravida Para Term Preterm AB TAB SAB Ectopic Multiple Living   Review of Systems  Respiratory: Negative for shortness of breath.   Psychiatric/Behavioral: The patient is nervous/anxious.       Allergies  Narcan and Suboxone  Home Medications   Prior to Admission medications   Medication Sig Start Date End Date Taking? Authorizing Provider  ALPRAZolam Prudy Feeler) 0.5 MG tablet Take 0.5 mg by mouth at bedtime as needed for anxiety.    Historical Provider, MD  celecoxib (CELEBREX) 200 MG capsule Take 1 capsule (200 mg total) by mouth 2 (two) times daily. 03/07/14   Velna Hatchet May Agustin, NP  clonazePAM (KLONOPIN) 1 MG tablet Take 1 tablet (1 mg total) by mouth 3 (three) times daily. 04/17/14   Vivianne Master, PA-C  cloNIDine (CATAPRES - DOSED IN MG/24 HR) 0.1 mg/24hr patch Place 1 patch (0.1 mg total) onto the skin once a week. 04/27/14   Quentin Angst, MD  gabapentin (NEURONTIN) 300 MG capsule Take 2 capsules (600 mg total) by mouth 3 (three) times daily. 04/27/14   Quentin Angst, MD  hydrOXYzine (ATARAX/VISTARIL) 25 MG tablet Take 1 tablet (25 mg total) by mouth every  6 (six) hours as needed for anxiety. 04/27/14   Quentin Angst, MD  lamoTRIgine (LAMICTAL) 25 MG tablet Take 1 tablet (25 mg total) by mouth daily. 04/27/14   Quentin Angst, MD  meclizine (ANTIVERT) 25 MG tablet Take 1 tablet (25 mg total) by mouth 3 (three) times daily as needed for dizziness or nausea. 03/07/14   Lindwood Qua, NP  metFORMIN (GLUCOPHAGE) 500 MG tablet Take 1 tablet (500 mg total) by mouth 2 (two) times daily with a meal. 04/27/14   Quentin Angst, MD  methadone (DOLOPHINE) 10 MG tablet Take 9 mg by mouth daily.    Historical Provider, MD  mirtazapine (REMERON) 7.5 MG tablet Take 1 tablet (7.5 mg total) by mouth at bedtime. 04/27/14   Quentin Angst, MD  nicotine (NICODERM CQ - DOSED IN MG/24 HOURS) 21 mg/24hr patch  Place 1 patch (21 mg total) onto the skin daily at 6 (six) AM. 03/07/14   Velna Hatchet May Agustin, NP  OLANZapine (ZYPREXA) 15 MG tablet Take 1 tablet (15 mg total) by mouth at bedtime. 04/27/14   Quentin Angst, MD  polyethylene glycol (MIRALAX / GLYCOLAX) packet Take 17 g by mouth 2 (two) times daily. Hold if you develop diarrhea Patient not taking: Reported on 03/24/2014 03/07/14   Velna Hatchet May Agustin, NP  senna-docusate (SENOKOT-S) 8.6-50 MG per tablet Take 1 tablet by mouth at bedtime. Hold if you develop diarrhea Patient not taking: Reported on 03/24/2014 03/07/14   Velna Hatchet May Agustin, NP   BP 122/76 mmHg  Pulse 74  Temp(Src) 97.9 F (36.6 C) (Oral)  Resp 20  Ht  (1.651 m)  Wt 170 lb (77.111 kg)  BMI 28.29 kg/m2  SpO2 97%  LMP 03/15/2014 Physical Exam  Constitutional: She is oriented to person, place, and time. She appears well-developed and well-nourished. No distress.  HENT:  Head: Normocephalic and atraumatic.  Mouth/Throat: Oropharynx is clear and moist. No oropharyngeal exudate.  Eyes: Pupils are equal, round, and reactive to light. Right eye exhibits no discharge. Left eye exhibits no discharge. No scleral icterus.  Neck: Normal range of motion. Neck supple.  Cardiovascular: Normal rate.   Pulmonary/Chest: Effort normal. No respiratory distress.  Musculoskeletal: Normal range of motion. She exhibits no edema.  Neurological: She is alert and oriented to person, place, and time. No cranial nerve deficit.  Skin: Skin is warm and dry. No rash noted. She is not diaphoretic.  Psychiatric: She has a normal mood and affect. Her behavior is normal.  Nursing note and vitals reviewed.   ED Course  Procedures  DIAGNOSTIC STUDIES: Oxygen Saturation is 97% on room air, normal by my interpretation.    COORDINATION OF CARE: 10:24 AM-Discussed treatment plan which includes bringing in social Investment banker, operational with pt at bedside and pt agreed to plan.   10:30 AM - Per community  liaison, patient has an appointment with PCP at [facility] in 4 days.  Labs Review Labs Reviewed - No data to display  Imaging Review No results found.   EKG Interpretation None      MDM   Final diagnoses:  Encounter for medication refill    Patient here denying any medical complaints, only requesting a medication refill on her prescribed clonazepam. Reviewing patient's notes, she was denies this medication refill at her PCP and instructed to follow-up at behavioral health. Patient has missed 2 appointments at behavioral health, and has failed to follow-up with them. She states she is unable to  [facility] because she is unable to weight on a ride for multiple hours. Patient also went to the urgent care to receive his medication refill and was denied there. 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 Today we provided patient with resources at [facility] which is a walk-in clinic where she would be seen by psychiatrist and given appropriate therapy there. Medical screening performed, and patient does not show any signs or symptoms of having any medical urgency or emergency. I strongly advised patient to go there today as she could be seen there today. As it is Thursday she has today and tomorrow to be seen as an outpatient without making any appointments. Patient also has an upcoming appointment at her PCP at the wellness Center on Monday. Patient does not show any signs or symptoms of withdrawal during exam or during her stay at the emergency room here. Patient still has 2 of her prescribed clonazepam with her. I strongly encouraged patient to  follow-up at [facility], discussed return precautions with her, and patient verbalizes understanding and agreement of this plan. I encouraged patient to call or return to the ER with any development of symptoms or should she have any questions or concerns.  I personally performed the services described in this documentation, which was scribed in my presence. The recorded information has been reviewed and is accurate.  BP 122/76 mmHg  Pulse 74  Temp(Src) 97.9 F (36.6 C) (Oral)  Resp 20  Ht 5\' 5"  (1.651 m)  Wt 170 lb (77.111 kg)  BMI 28.29 kg/m2  SpO2 97%  LMP 03/15/2014  Signed,  Ladona MowJoe Seymore Brodowski, PA-C 3:48 PM  Patient discussed with Dr. Benjiman CoreNathan Pickering, M.D.  Monte FantasiaJoseph W Ryle Buscemi, PA-C 04/27/14 1555  Juliet RudeNathan R. Rubin PayorPickering, MD 04/27/14 [phone]

## 2014-04-27 NOTE — Telephone Encounter (Signed)
[facility] pharmacy states that they do not have clonidine patches but they have tablets.  I called the pt to discuss and she states ok to change over to tablets.  I advsied we will send this message to Dr. Hyman HopesJegede and have him change the rx over to tablets.

## 2014-04-27 NOTE — Telephone Encounter (Signed)
Nurse from [place] verified if pt has an appointment with PCP Stated Pt was at the ER was unable to get appointment with Mental health

## 2014-04-27 NOTE — Progress Notes (Signed)
[facility] Community [facility] Eligibility Specialist  Spoke with patient regarding medication assistance and community resources. Patient has a upcoming appointment with her pcp [facility]) for  05/01/14 @ 3:00pm. Patient tearful and anxious about obtaining medications during this ED visit. Patient states she will run out of her medications today and needs them refilled. Past notes state that patient has been given extensions on these medications before. Patient in the past also given resources to follow up to help manage her mental health needs. [facility] given [facility] information as a form of follow up care once discharged today. I informed patient that mental health needs could be managed at that practice and it was vital to follow up with them. Patient verbalized understanding, but showed frustrations about discharge plan of care.

## 2014-04-27 NOTE — ED Notes (Signed)
[facility] and given refill on her meds.

## 2014-04-27 NOTE — Discharge Instructions (Signed)
Medication Refill, Emergency Department °We have refilled your medication today as a courtesy to you. It is best for your medical care, however, to take care of getting refills done through your primary caregiver's office. They have your records and can do a better job of follow-up than we can in the emergency department. °On maintenance medications, we often only prescribe enough medications to get you by until you are able to see your regular caregiver. This is a more expensive way to refill medications. °In the future, please plan for refills so that you will not have to use the emergency department for this. °Thank you for your help. Your help allows us to better take care of the daily emergencies that enter our department. °Document Released: 06/13/2003 Document Revised: 05/19/2011 Document Reviewed: 06/03/2013 °ExitCare® Patient Information ©2015 [employer]. This information is not intended to replace advice given to you by your health care provider. Make sure you discuss any questions you have with your health care provider. ° °

## 2014-04-27 NOTE — ED Notes (Signed)
PA saw pt. Patient left with family. Patient refused to sign discharge.

## 2014-04-27 NOTE — ED Notes (Addendum)
Pt is crying and agitated at triage because she is scared. Pt requested refill of clonazepam . Pt reports she has been out of meds for BI-polar DX. [facility]

## 2014-05-01 ENCOUNTER — Inpatient Hospital Stay (HOSPITAL_COMMUNITY)
Admission: AD | Admit: 2014-05-01 | Discharge: 2014-05-05 | DRG: 885 | Disposition: A | Discharge status: Home / Self Care | Payer: 59 | Source: Intra-hospital | Attending: Psychiatry | Admitting: Psychiatry

## 2014-05-01 ENCOUNTER — Encounter (HOSPITAL_COMMUNITY): Payer: Self-pay

## 2014-05-01 ENCOUNTER — Emergency Department (HOSPITAL_COMMUNITY)
Admission: EM | Admit: 2014-05-01 | Discharge: 2014-05-01 | Disposition: A | Discharge status: Other Acute Inpatient Hospital | Payer: 59 | Attending: Emergency Medicine | Admitting: Emergency Medicine

## 2014-05-01 ENCOUNTER — Ambulatory Visit: Payer: Self-pay | Admitting: Internal Medicine

## 2014-05-01 ENCOUNTER — Encounter (HOSPITAL_COMMUNITY): Payer: Self-pay | Admitting: General Practice

## 2014-05-01 DIAGNOSIS — F1120 Opioid dependence, uncomplicated: Secondary | ICD-10-CM

## 2014-05-01 DIAGNOSIS — F13232 Sedative, hypnotic or anxiolytic dependence with withdrawal with perceptual disturbance: Secondary | ICD-10-CM | POA: Diagnosis present

## 2014-05-01 DIAGNOSIS — F1190 Opioid use, unspecified, uncomplicated: Secondary | ICD-10-CM | POA: Diagnosis not present

## 2014-05-01 DIAGNOSIS — F4310 Post-traumatic stress disorder, unspecified: Secondary | ICD-10-CM | POA: Diagnosis present

## 2014-05-01 DIAGNOSIS — Z634 Disappearance and death of family member: Secondary | ICD-10-CM | POA: Diagnosis not present

## 2014-05-01 DIAGNOSIS — R45851 Suicidal ideations: Secondary | ICD-10-CM

## 2014-05-01 DIAGNOSIS — F17210 Nicotine dependence, cigarettes, uncomplicated: Secondary | ICD-10-CM | POA: Diagnosis present

## 2014-05-01 DIAGNOSIS — F3130 Bipolar disorder, current episode depressed, mild or moderate severity, unspecified: Secondary | ICD-10-CM

## 2014-05-01 DIAGNOSIS — F1320 Sedative, hypnotic or anxiolytic dependence, uncomplicated: Secondary | ICD-10-CM | POA: Diagnosis present

## 2014-05-01 DIAGNOSIS — Z720 Tobacco use: Secondary | ICD-10-CM | POA: Insufficient documentation

## 2014-05-01 DIAGNOSIS — F319 Bipolar disorder, unspecified: Secondary | ICD-10-CM | POA: Insufficient documentation

## 2014-05-01 DIAGNOSIS — G40909 Epilepsy, unspecified, not intractable, without status epilepticus: Secondary | ICD-10-CM | POA: Insufficient documentation

## 2014-05-01 DIAGNOSIS — F315 Bipolar disorder, current episode depressed, severe, with psychotic features: Principal | ICD-10-CM | POA: Diagnosis present

## 2014-05-01 DIAGNOSIS — Z79899 Other long term (current) drug therapy: Secondary | ICD-10-CM | POA: Diagnosis not present

## 2014-05-01 DIAGNOSIS — G8929 Other chronic pain: Secondary | ICD-10-CM | POA: Insufficient documentation

## 2014-05-01 DIAGNOSIS — Z791 Long term (current) use of non-steroidal anti-inflammatories (NSAID): Secondary | ICD-10-CM | POA: Insufficient documentation

## 2014-05-01 DIAGNOSIS — F1220 Cannabis dependence, uncomplicated: Secondary | ICD-10-CM | POA: Insufficient documentation

## 2014-05-01 DIAGNOSIS — F13932 Sedative, hypnotic or anxiolytic use, unspecified with withdrawal with perceptual disturbances: Secondary | ICD-10-CM | POA: Diagnosis present

## 2014-05-01 DIAGNOSIS — M1990 Unspecified osteoarthritis, unspecified site: Secondary | ICD-10-CM | POA: Insufficient documentation

## 2014-05-01 DIAGNOSIS — F1121 Opioid dependence, in remission: Secondary | ICD-10-CM | POA: Diagnosis present

## 2014-05-01 DIAGNOSIS — Z8719 Personal history of other diseases of the digestive system: Secondary | ICD-10-CM | POA: Insufficient documentation

## 2014-05-01 DIAGNOSIS — R443 Hallucinations, unspecified: Secondary | ICD-10-CM

## 2014-05-01 DIAGNOSIS — F419 Anxiety disorder, unspecified: Secondary | ICD-10-CM | POA: Insufficient documentation

## 2014-05-01 LAB — URINALYSIS, ROUTINE W REFLEX MICROSCOPIC
BILIRUBIN URINE: NEGATIVE
Glucose, UA: NEGATIVE mg/dL
Ketones, ur: NEGATIVE mg/dL
Leukocytes, UA: NEGATIVE
Nitrite: NEGATIVE
Protein, ur: NEGATIVE mg/dL
SPECIFIC GRAVITY, URINE: 1.006 (ref 1.005–1.030)
Urobilinogen, UA: 0.2 mg/dL (ref 0.0–1.0)
pH: 6.5 (ref 5.0–8.0)

## 2014-05-01 LAB — RAPID URINE DRUG SCREEN, HOSP PERFORMED
Amphetamines: NOT DETECTED
BARBITURATES: NOT DETECTED
Benzodiazepines: POSITIVE — AB
Cocaine: NOT DETECTED
OPIATES: NOT DETECTED
Tetrahydrocannabinol: POSITIVE — AB

## 2014-05-01 LAB — COMPREHENSIVE METABOLIC PANEL
ALT: 447 U/L — ABNORMAL HIGH (ref 0–35)
AST: 217 U/L — AB (ref 0–37)
Albumin: 4.7 g/dL (ref 3.5–5.2)
Alkaline Phosphatase: 101 U/L (ref 39–117)
Anion gap: 6 (ref 5–15)
BILIRUBIN TOTAL: 0.4 mg/dL (ref 0.3–1.2)
BUN: 13 mg/dL (ref 6–23)
CALCIUM: 9.5 mg/dL (ref 8.4–10.5)
CHLORIDE: 106 mmol/L (ref 96–112)
CO2: 25 mmol/L (ref 19–32)
Creatinine, Ser: 0.61 mg/dL (ref 0.50–1.10)
GFR calc non Af Amer: >90 mL/min (ref >90)
Glucose, Bld: 96 mg/dL (ref 70–99)
Potassium: 3.8 mmol/L (ref 3.5–5.1)
SODIUM: 137 mmol/L (ref 135–145)
Total Protein: 7.7 g/dL (ref 6.0–8.3)

## 2014-05-01 LAB — URINE MICROSCOPIC-ADD ON

## 2014-05-01 LAB — CBC
HCT: 38.4 % (ref 36.0–46.0)
Hemoglobin: 13.4 g/dL (ref 12.0–15.0)
MCH: 29.9 pg (ref 26.0–34.0)
MCHC: 34.9 g/dL (ref 30.0–36.0)
MCV: 85.7 fL (ref 78.0–100.0)
Platelets: 230 10*3/uL (ref 150–400)
RBC: 4.48 MIL/uL (ref 3.87–5.11)
RDW: 12.4 % (ref 11.5–15.5)
WBC: 5.3 10*3/uL (ref 4.0–10.5)

## 2014-05-01 LAB — ETHANOL: Alcohol, Ethyl (B): <5 mg/dL (ref 0–9)

## 2014-05-01 LAB — SALICYLATE LEVEL: Salicylate Lvl: <4 mg/dL (ref 2.8–20.0)

## 2014-05-01 LAB — ACETAMINOPHEN LEVEL: Acetaminophen (Tylenol), Serum: <10 ug/mL — ABNORMAL LOW (ref 10–30)

## 2014-05-01 MED ORDER — CLONIDINE HCL 0.1 MG PO TABS
0.1000 mg | ORAL_TABLET | Freq: Four times a day (QID) | ORAL | Status: DC
  Filled 2014-05-01 (×7): qty 1

## 2014-05-01 MED ORDER — GABAPENTIN 300 MG PO CAPS
600.0000 mg | ORAL_CAPSULE | Freq: Three times a day (TID) | ORAL | Status: DC
  Administered 2014-05-01: 600 mg via ORAL
  Filled 2014-05-01: qty 2

## 2014-05-01 MED ORDER — ACETAMINOPHEN 325 MG PO TABS: 650.0000 mg | ORAL_TABLET | Freq: Four times a day (QID) | ORAL | Status: DC | PRN

## 2014-05-01 MED ORDER — NAPROXEN 500 MG PO TABS: 500.0000 mg | ORAL_TABLET | Freq: Two times a day (BID) | ORAL | Status: DC | PRN

## 2014-05-01 MED ORDER — HYDROXYZINE HCL 25 MG PO TABS
25.0000 mg | ORAL_TABLET | Freq: Four times a day (QID) | ORAL | Status: DC | PRN
  Administered 2014-05-01 – 2014-05-05 (×8): 25 mg via ORAL
  Filled 2014-05-01 (×6): qty 1
  Filled 2014-05-01: qty 20
  Filled 2014-05-01 (×3): qty 1

## 2014-05-01 MED ORDER — ONDANSETRON 4 MG PO TBDP: 4.0000 mg | ORAL_TABLET | Freq: Four times a day (QID) | ORAL | Status: DC | PRN

## 2014-05-01 MED ORDER — LOPERAMIDE HCL 2 MG PO CAPS
2.0000 mg | ORAL_CAPSULE | ORAL | Status: DC | PRN
  Administered 2014-05-02: 4 mg via ORAL
  Filled 2014-05-01: qty 2

## 2014-05-01 MED ORDER — ZOLPIDEM TARTRATE 5 MG PO TABS: 5.0000 mg | ORAL_TABLET | Freq: Every evening | ORAL | Status: DC | PRN

## 2014-05-01 MED ORDER — IBUPROFEN 200 MG PO TABS: 600.0000 mg | ORAL_TABLET | Freq: Three times a day (TID) | ORAL | Status: DC | PRN

## 2014-05-01 MED ORDER — DICYCLOMINE HCL 20 MG PO TABS: 20.0000 mg | ORAL_TABLET | Freq: Four times a day (QID) | ORAL | Status: DC | PRN

## 2014-05-01 MED ORDER — CLONIDINE HCL 0.1 MG PO TABS: 0.1000 mg | ORAL_TABLET | Freq: Every day | ORAL | Status: DC

## 2014-05-01 MED ORDER — LORAZEPAM 1 MG PO TABS
1.0000 mg | ORAL_TABLET | Freq: Three times a day (TID) | ORAL | Status: DC | PRN
  Administered 2014-05-01: 1 mg via ORAL
  Filled 2014-05-01: qty 1

## 2014-05-01 MED ORDER — ALUM & MAG HYDROXIDE-SIMETH 200-200-20 MG/5ML PO SUSP: 30.0000 mL | ORAL | Status: DC | PRN

## 2014-05-01 MED ORDER — CLONIDINE HCL 0.1 MG PO TABS
0.1000 mg | ORAL_TABLET | Freq: Four times a day (QID) | ORAL | Status: DC
  Administered 2014-05-01 (×2): 0.1 mg via ORAL
  Filled 2014-05-01 (×2): qty 1

## 2014-05-01 MED ORDER — LAMOTRIGINE 25 MG PO TABS
25.0000 mg | ORAL_TABLET | Freq: Every day | ORAL | Status: DC
  Administered 2014-05-02 – 2014-05-05 (×4): 25 mg via ORAL
  Filled 2014-05-01 (×6): qty 1

## 2014-05-01 MED ORDER — METFORMIN HCL 500 MG PO TABS
500.0000 mg | ORAL_TABLET | Freq: Two times a day (BID) | ORAL | Status: DC
  Administered 2014-05-02 – 2014-05-05 (×7): 500 mg via ORAL
  Filled 2014-05-01 (×10): qty 1

## 2014-05-01 MED ORDER — HYDROXYZINE HCL 25 MG PO TABS: 25.0000 mg | ORAL_TABLET | Freq: Four times a day (QID) | ORAL | Status: DC | PRN

## 2014-05-01 MED ORDER — METHOCARBAMOL 500 MG PO TABS: 500.0000 mg | ORAL_TABLET | Freq: Three times a day (TID) | ORAL | Status: DC | PRN

## 2014-05-01 MED ORDER — ONDANSETRON HCL 4 MG PO TABS: 4.0000 mg | ORAL_TABLET | Freq: Three times a day (TID) | ORAL | Status: DC | PRN

## 2014-05-01 MED ORDER — LAMOTRIGINE 25 MG PO TABS
25.0000 mg | ORAL_TABLET | Freq: Every day | ORAL | Status: DC
  Administered 2014-05-01: 25 mg via ORAL
  Filled 2014-05-01: qty 1

## 2014-05-01 MED ORDER — ACETAMINOPHEN 325 MG PO TABS
650.0000 mg | ORAL_TABLET | ORAL | Status: DC | PRN
  Administered 2014-05-01 (×2): 650 mg via ORAL
  Filled 2014-05-01 (×2): qty 2

## 2014-05-01 MED ORDER — METHOCARBAMOL 500 MG PO TABS
500.0000 mg | ORAL_TABLET | Freq: Three times a day (TID) | ORAL | Status: DC | PRN
  Administered 2014-05-01 – 2014-05-04 (×5): 500 mg via ORAL
  Filled 2014-05-01 (×5): qty 1

## 2014-05-01 MED ORDER — NICOTINE 21 MG/24HR TD PT24
21.0000 mg | MEDICATED_PATCH | Freq: Every day | TRANSDERMAL | Status: DC
  Administered 2014-05-01: 21 mg via TRANSDERMAL
  Filled 2014-05-01: qty 1

## 2014-05-01 MED ORDER — LORAZEPAM 1 MG PO TABS
1.0000 mg | ORAL_TABLET | Freq: Four times a day (QID) | ORAL | Status: DC
  Administered 2014-05-01 (×2): 1 mg via ORAL
  Filled 2014-05-01 (×2): qty 1

## 2014-05-01 MED ORDER — HALOPERIDOL 5 MG PO TABS
5.0000 mg | ORAL_TABLET | Freq: Two times a day (BID) | ORAL | Status: DC
  Administered 2014-05-01: 5 mg via ORAL
  Filled 2014-05-01: qty 1

## 2014-05-01 MED ORDER — LORAZEPAM 1 MG PO TABS: 1.0000 mg | ORAL_TABLET | Freq: Two times a day (BID) | ORAL | Status: DC

## 2014-05-01 MED ORDER — BENZTROPINE MESYLATE 1 MG PO TABS
1.0000 mg | ORAL_TABLET | Freq: Every day | ORAL | Status: DC
  Administered 2014-05-01: 1 mg via ORAL
  Filled 2014-05-01: qty 1

## 2014-05-01 MED ORDER — CLONIDINE HCL 0.1 MG PO TABS: 0.1000 mg | ORAL_TABLET | ORAL | Status: DC

## 2014-05-01 MED ORDER — LOPERAMIDE HCL 2 MG PO CAPS: 2.0000 mg | ORAL_CAPSULE | ORAL | Status: DC | PRN

## 2014-05-01 MED ORDER — BENZTROPINE MESYLATE 1 MG PO TABS
1.0000 mg | ORAL_TABLET | Freq: Every day | ORAL | Status: DC
  Administered 2014-05-02 – 2014-05-05 (×4): 1 mg via ORAL
  Filled 2014-05-01: qty 1
  Filled 2014-05-01: qty 14
  Filled 2014-05-01 (×5): qty 1

## 2014-05-01 MED ORDER — MAGNESIUM HYDROXIDE 400 MG/5ML PO SUSP: 30.0000 mL | Freq: Every day | ORAL | Status: DC | PRN

## 2014-05-01 MED ORDER — LORAZEPAM 1 MG PO TABS: 1.0000 mg | ORAL_TABLET | Freq: Three times a day (TID) | ORAL | Status: DC

## 2014-05-01 MED ORDER — TRAZODONE HCL 50 MG PO TABS
50.0000 mg | ORAL_TABLET | Freq: Every evening | ORAL | Status: DC | PRN
  Administered 2014-05-01: 50 mg via ORAL
  Filled 2014-05-01 (×6): qty 1

## 2014-05-01 MED ORDER — LORAZEPAM 1 MG PO TABS
1.0000 mg | ORAL_TABLET | Freq: Every day | ORAL | Status: DC
Start: 2014-05-05 — End: 2014-05-01

## 2014-05-01 MED ORDER — GABAPENTIN 300 MG PO CAPS
600.0000 mg | ORAL_CAPSULE | Freq: Three times a day (TID) | ORAL | Status: DC
  Administered 2014-05-02 – 2014-05-05 (×10): 600 mg via ORAL
  Filled 2014-05-01 (×14): qty 2

## 2014-05-01 MED ORDER — HALOPERIDOL 5 MG PO TABS
5.0000 mg | ORAL_TABLET | Freq: Two times a day (BID) | ORAL | Status: DC
  Administered 2014-05-01 – 2014-05-02 (×2): 5 mg via ORAL
  Filled 2014-05-01 (×6): qty 1

## 2014-05-01 MED ORDER — NAPROXEN 500 MG PO TABS
500.0000 mg | ORAL_TABLET | Freq: Two times a day (BID) | ORAL | Status: DC | PRN
  Administered 2014-05-01 – 2014-05-04 (×4): 500 mg via ORAL
  Filled 2014-05-01 (×4): qty 1

## 2014-05-01 NOTE — Tx Team (Signed)
Initial Interdisciplinary Treatment Plan   PATIENT STRESSORS: Financial difficulties Substance abuse Hearing Voices   PATIENT STRENGTHS: Average or above average intelligence General fund of knowledge   PROBLEM LIST: Problem List/Patient Goals Date to be addressed Date deferred Reason deferred Estimated date of resolution  Depression 05/01/2014           Suicidal Ideation 05/01/2014           Substance Abuse 05/01/2014           Psychosis 05/01/2014     "Getting off benzos is a goal" 05/01/2014     "Learn copin' skills to deal with stress and anxiety" 05/01/2014      DISCHARGE CRITERIA:  Improved stabilization in mood, thinking, and/or behavior Verbal commitment to aftercare and medication compliance Withdrawal symptoms are absent or subacute and managed without 24-hour nursing intervention  PRELIMINARY DISCHARGE PLAN: Attend 12-step recovery group Outpatient therapy  PATIENT/FAMIILY INVOLVEMENT: This treatment plan has been presented to and reviewed with the patient, [name].  The patient and family have been given the opportunity to ask questions and make suggestions.  Marzetta BoardDopson, Kaydie Petsch E 05/01/2014, 7:45 PM

## 2014-05-01 NOTE — ED Notes (Signed)
TTS/ Counselor at bedside 

## 2014-05-01 NOTE — ED Provider Notes (Signed)
CSN: [id number]     Arrival date & time 05/01/14  [id number] History   First MD Initiated Contact with Patient 05/01/14 0703     Chief Complaint  Patient presents with  . Suicidal  . Hallucinations     (Consider location/radiation/quality/duration/timing/severity/associated sxs/prior Treatment) HPI  Pt presenting with c/o feeling suicidal, hearing command hallucinations to kill herself.   She states she has been in the process of weaning from methadone over the past year.  She has been trying to wean from benzos as well, but over the past 2 days has taken 36 pills in an effort to sleep and to stop hearing the voices.  She states " I just can't take it any more".  She also c/o burning with urination.  No fever/chills.  No vomiting.  Pt states the voices are telling her to run out in front of a car or to overdose on meds.  There are no other associated systemic symptoms, there are no other alleviating or modifying factors.   Past Medical History  Diagnosis Date  . Drug abuse   . GERD (gastroesophageal reflux disease)   . Depression   . Arthritis   . Seizures   . Chronic pain   . Methadone maintenance therapy patient   . Anxiety    Past Surgical History  Procedure Laterality Date  . Appendectomy    . Cesarean section    . Mandible fracture surgery     Family History  Problem Relation Age of Onset  . Mental illness Mother   . Mental illness Brother   . Diabetes Maternal Grandmother   . Mental illness Maternal Grandmother   . Hypertension Maternal Grandfather   . Diabetes Paternal Grandmother   . Heart disease Paternal Grandmother    History  Substance Use Topics  . Smoking status: Current Some Day Smoker -- 1.00 packs/day for 5 years    Last Attempt to Quit: 01/13/2011  . Smokeless tobacco: Never Used  . Alcohol Use: No     Comment: Quit three months ago   OB History    Gravida Para Term Preterm AB TAB SAB Ectopic Multiple Living   3 2   1 1          Review of Systems  ROS  reviewed and all otherwise negative except for mentioned in HPI    Allergies  Narcan and Suboxone  Home Medications   Prior to Admission medications   Medication Sig Start Date End Date Taking? Authorizing Provider  celecoxib (CELEBREX) 200 MG capsule Take 1 capsule (200 mg total) by mouth 2 (two) times daily. 03/07/14   Velna HatchetSheila May Agustin, NP  cloNIDine (CATAPRES - DOSED IN MG/24 HR) 0.1 mg/24hr patch Place 1 patch (0.1 mg total) onto the skin once a week. 04/27/14   Quentin Angstlugbemiga E Jegede, MD  gabapentin (NEURONTIN) 300 MG capsule Take 2 capsules (600 mg total) by mouth 3 (three) times daily. 04/27/14   Quentin Angstlugbemiga E Jegede, MD  hydrOXYzine (ATARAX/VISTARIL) 25 MG tablet Take 1 tablet (25 mg total) by mouth every 6 (six) hours as needed for anxiety. 04/27/14   Quentin Angstlugbemiga E Jegede, MD  lamoTRIgine (LAMICTAL) 25 MG tablet Take 1 tablet (25 mg total) by mouth daily. 04/27/14   Quentin Angstlugbemiga E Jegede, MD  meclizine (ANTIVERT) 25 MG tablet Take 1 tablet (25 mg total) by mouth 3 (three) times daily as needed for dizziness or nausea. 03/07/14   Lindwood QuaSheila May Agustin, NP  metFORMIN (GLUCOPHAGE) 500 MG tablet Take 1 tablet (500  mg total) by mouth 2 (two) times daily with a meal. 04/27/14   Quentin Angst, MD  mirtazapine (REMERON) 7.5 MG tablet Take 1 tablet (7.5 mg total) by mouth at bedtime. 04/27/14   Quentin Angst, MD  nicotine (NICODERM CQ - DOSED IN MG/24 HOURS) 21 mg/24hr patch Place 1 patch (21 mg total) onto the skin daily at 6 (six) AM. 03/07/14   Velna Hatchet May Agustin, NP  OLANZapine (ZYPREXA) 15 MG tablet Take 1 tablet (15 mg total) by mouth at bedtime. 04/27/14   Quentin Angst, MD  polyethylene glycol (MIRALAX / GLYCOLAX) packet Take 17 g by mouth 2 (two) times daily. Hold if you develop diarrhea Patient not taking: Reported on 03/24/2014 03/07/14   Velna Hatchet May Agustin, NP  senna-docusate (SENOKOT-S) 8.6-50 MG per tablet Take 1 tablet by mouth at bedtime. Hold if you develop diarrhea Patient  not taking: Reported on 03/24/2014 03/07/14   Velna Hatchet May Agustin, NP   BP 116/68 mmHg  Pulse 87  Temp(Src) 99.2 F (37.3 C) (Oral)  Resp 16  SpO2 97%  LMP 03/15/2014  Vitals reviewed Physical Exam  Physical Examination: General appearance - alert, well appearing, and in no distress Mental status - alert, oriented to person, place, and time Eyes - no conjunctival injection, no scleral icterus Mouth - mucous membranes moist, pharynx normal without lesions Chest - clear to auscultation, no wheezes, rales or rhonchi, symmetric air entry Heart - normal rate, regular rhythm, normal S1, S2, no murmurs, rubs, clicks or gallops Extremities - peripheral pulses normal, no pedal edema, no clubbing or cyanosis Skin - normal coloration and turgor, no rashes Psych- tearful, anxious appearing  ED Course  Procedures (including critical care time) Labs Review Labs Reviewed  ACETAMINOPHEN LEVEL - Abnormal; Notable for the following:    Acetaminophen (Tylenol), Serum <10.0 (*)    All other components within normal limits  COMPREHENSIVE METABOLIC PANEL - Abnormal; Notable for the following:    AST 217 (*)    ALT 447 (*)    All other components within normal limits  URINE RAPID DRUG SCREEN (HOSP PERFORMED) - Abnormal; Notable for the following:    Benzodiazepines POSITIVE (*)    Tetrahydrocannabinol POSITIVE (*)    All other components within normal limits  URINALYSIS, ROUTINE W REFLEX MICROSCOPIC - Abnormal; Notable for the following:    Color, Urine STRAW (*)    Hgb urine dipstick LARGE (*)    All other components within normal limits  URINE MICROSCOPIC-ADD ON - Abnormal; Notable for the following:    Squamous Epithelial / LPF FEW (*)    Bacteria, UA FEW (*)    All other components within normal limits  URINE CULTURE  CBC  ETHANOL  SALICYLATE LEVEL    Imaging Review No results found.   EKG Interpretation None      MDM   Final diagnoses:  Anxiety  Hallucinations    Pt  presenting with c/o anxiety and depression, she endorses hearing voices telling her to kill herself.  She is medically cleared, TTS has evaluated her and recommends inpatient treatment for this patient.  Psych holding orders written.  She is currently voluntary, but if she is wanting to leave, involuntary commitment would need to be considered given her command hallucinations.      Ethelda Chick, MD 05/01/14 [phone]
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
## 2014-05-01 NOTE — ED Notes (Signed)
Patient and belongings wanded by security.  

## 2014-05-01 NOTE — ED Notes (Addendum)
Pt reports hx of bipolar and seizures, on lamictal. Complaint with meds. Hx heroine abuse, clean for years, [facility]. Has been detoxing off heroine for last year. Wants to be compeltely off heroine. Pt normally prescribed benzos, only suppose to take xanax 0.5 mg TID, but has taken up to 10mg  per day x2 days. Started hearing voices yesterday (a mans voice) telling her to kill herself. Denies hx of AH. Reports she wants to get off benzos completely. Has job lined up at [employer] to start next week and and wants to get well and not hear voices before starting job. Denies HI. Contracts for safety. Denies ETOH use or other drug use. Tearful. Pt on menstrual cycle at this time, reports abdominal cramps 8/10 from menstrual cycle.

## 2014-05-01 NOTE — BH Assessment (Signed)
TTS Ala Dach(Ford) informed TTS Belenda CruiseKristin.C) of the consult.

## 2014-05-01 NOTE — Consult Note (Signed)
[facility] Face-to-Face Psychiatry Consult   Reason for Consult:  Psychiatric Evaluation Referring Physician:  EDP Patient Identification: [name] MRN:  [id number] Principal Diagnosis: Bipolar 1 disorder, depressed Diagnosis:   Patient Active Problem List   Diagnosis Date Noted  . Substance abuse [F19.10]   . Benzodiazepine withdrawal [F13.239] 03/24/2014  . Suicidal ideation [R45.851] 03/24/2014  . 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 Seizure [R56.9]   . BV (bacterial vaginosis) [N76.0, A49.9]   . Pelvic pain in female [R10.2]   . Vaginal discharge [N89.8]   . Bipolar I disorder, most recent episode mixed [F31.60] 03/07/2014  . Opioid dependence [F11.20] 03/07/2014  . 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 PTSD (post-traumatic stress disorder) [F43.10]   . Opioid use disorder, severe, dependence [F11.20]   . 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 Cannabis abuse [F12.10] 09/24/2013  . Benzodiazepine misuse [F13.10] 09/24/2013  . Stimulant abuse [F15.10] 09/24/2013  . Hepatitis C [B19.20] 08/27/2013  . Unspecified constipation [K59.00] 08/27/2013  . UTI (urinary tract infection) [N39.0] 08/27/2013  . GERD (gastroesophageal reflux disease) [K21.9] 08/26/2013  . Transaminitis [R74.0] 08/25/2013  . Depression [F32.9] 08/25/2013  . Withdrawal seizures [F19.239] 08/24/2013  . Polysubstance abuse [F19.10] 08/24/2013  . Drug withdrawal [F19.939] 08/24/2013  . Dependent drug abuse [F19.20] 03/09/2013  . Neurosis, posttraumatic [F43.10] 03/09/2013  . Hidradenitis [L73.2] 12/07/2012    Total Time spent with patient: 45 minutes  Subjective:   [name] is a 29 y.o. female patient who presented voluntarily to [facility] ED for evaluation of "I started hearing voices telling me to kill myself."  States "I took 20 tabs of Klonopin yesterday to calm the voices by trying to makes myself to go to sleep."    HPI:  [name] is a 30 yo Caucasian  female who reports she started hearing voices yesterday.  States she took 20 of her Klonopin tabs to go to sleep so she would not hear the voices. States she has a history of bipolar but this is the first time she has heard voices and "it is scaring me."  She says the voices came on all of a sudden while she was standing in line at the gas station and describes the voice as a man's voice. Her mother has a history of psychosis which concerns her. She reports she is on Lamictal and Seroquel was prescribed but she did not pick up this prescription.  States she she is currently on a Methadone taper of 45 mg from the methadone clinic for prior heroin addiction. 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 Her stressors include her husband divorced her last year and she gave her 29 year old daughter up for adoption 2 weeks ago  which has caused increased depression. States states she uses marijuana occasionally with last use being 2 weeks ago.  She lives with her boyfriend. She receives medication management through [facility]. She endorses suicidal ideation with plan to run out in front of a car or overdose on her meds. She denies homicidal ideation, intent or plan. She denies visual hallucinations.  She has been hospitalized in the past with her most recent hospitalization being December, 2015 at [facility].   HPI Elements:   Location:  Mood. Quality:  depressed, auditory hallucinations. Severity:  severe. Timing:  acute. Duration:  1 day. Context:  stressors, medication non-compliance, adoption of daughter.  Past Medical  History:  Past Medical History  Diagnosis Date  . Drug abuse   . GERD (gastroesophageal reflux disease)   . Depression   . Arthritis   . Seizures   . Chronic pain   . Methadone maintenance therapy patient   . Anxiety     Past Surgical History  Procedure Laterality Date  . Appendectomy    .  Cesarean section    . Mandible fracture surgery     Family History:  Family History  Problem Relation Age of Onset  . Mental illness Mother   . Mental illness Brother   . Diabetes Maternal Grandmother   . Mental illness Maternal Grandmother   . Hypertension Maternal Grandfather   . Diabetes Paternal Grandmother   . Heart disease Paternal Grandmother    Social History:  History  Alcohol Use No    Comment: Quit three months ago     History  Drug Use No    Comment: Hx of use- IV drug use.  Clean since 01/2007    History   Social History  . Marital Status: Legally Separated    Spouse Name: N/A  . Number of Children: N/A  . Years of Education: N/A   Social History Main Topics  . Smoking status: Current Some Day Smoker -- 1.00 packs/day for 5 years    Last Attempt to Quit: 01/13/2011  . Smokeless tobacco: Never Used  . Alcohol Use: No     Comment: Quit three months ago  . Drug Use: No     Comment: Hx of use- IV drug use.  Clean since 01/2007  . Sexual Activity: Yes   Other Topics Concern  . None   Social History Narrative   ** Merged History Encounter **       Additional Social History:    Prescriptions: Abuses colonopin  History of alcohol / drug use?: Yes Longest period of sobriety (when/how long): reports she has been using since age 29 Negative Consequences of Use: Personal relationships, Financial Name of Substance 1: Colonopin (Benzodiazipines)  1 - Age of First Use: 16 1 - Amount (size/oz): .5mg   1 - Frequency: prescribed 3 times daily but has been taking 10mg  the last 2 days 1 - Duration: all day  1 - Last Use / Amount: yesterday  Name of Substance 2: Methodone 2 - Age of First Use: 16 2 - Amount (size/oz): 45mg  2 - Frequency: everyday 2 - Duration: morning 2 - Last Use / Amount: yesterday Name of Substance 3: Marijuana 3 - Age of First Use: unknown 3 - Amount (size/oz): unknown 3 - Frequency: every couple of weeks 3 - Duration: N/A 3 - Last  Use / Amount: two weeks ago      Allergies:   Allergies  Allergen Reactions  . Narcan [Naloxone] Other (See Comments) and Itching    Violent withdrawal symptoms; interferes with methadone Interferes with methadone and causes withdrawal   . Suboxone [Buprenorphine Hcl-Naloxone Hcl] Other (See Comments) and Itching    Violent withdrawal symptoms; interferes with methadone Interferes with methadone and causes withdrawal     Vitals: Blood pressure 114/60, pulse 74, temperature 98.7 F (37.1 C), temperature source Oral, resp. rate 17, last menstrual period 03/15/2014, SpO2 100 %.  Risk to Self: Suicidal Ideation: Yes-Currently Present Suicidal Intent: Yes-Currently Present Is patient at risk for suicide?: Yes Suicidal Plan?: Yes-Currently Present Specify Current Suicidal Plan: run into traffic Access to Means: Yes Specify Access to Suicidal Means:  (has the ability to run  into traffic) What has been your use of drugs/alcohol within the last 12 months?: abuses colonopin, marijuana and takes methadone How many times?: 12 Other Self Harm Risks: prostitution Triggers for Past Attempts: Other (Comment) (divorce, depression) Risk to Others: Homicidal Ideation: No Thoughts of Harm to Others: No Current Homicidal Intent: No Current Homicidal Plan: No Access to Homicidal Means: No Identified Victim: None known History of harm to others?: No Assessment of Violence: None Noted Violent Behavior Description: none  Does patient have access to weapons?: No Criminal Charges Pending?: No Does patient have a court date: No Prior Inpatient Therapy: Prior Inpatient Therapy: Yes Prior Therapy Dates:  (7 admissions in last year) Prior Therapy Facilty/Provider(s): [facility] Reason for Treatment: substance abuse, depression Prior Outpatient Therapy: Prior Outpatient Therapy: Yes Prior Therapy Dates: unknown Prior Therapy Facilty/Provider(s): [facility] Reason for Treatment: bipolar disorder, substance  abuse  Current Facility-Administered Medications  Medication Dose Route Frequency Provider Last Rate Last Dose  . acetaminophen (TYLENOL) tablet 650 mg  650 mg Oral Q4H PRN Ethelda Chick, MD   650 mg at 05/01/14 0739  . alum & mag hydroxide-simeth (MAALOX/MYLANTA) 200-200-20 MG/5ML suspension 30 mL  30 mL Oral PRN Ethelda Chick, MD      . benztropine (COGENTIN) tablet 1 mg  1 mg Oral Daily Kristeen Mans, NP      . cloNIDine (CATAPRES) tablet 0.1 mg  0.1 mg Oral QID Kristeen Mans, NP   0.1 mg at 05/01/14 1349   Followed by  . [START ON 05/03/2014] cloNIDine (CATAPRES) tablet 0.1 mg  0.1 mg Oral BH-qamhs Kristeen Mans, NP       Followed by  . [START ON 05/06/2014] cloNIDine (CATAPRES) tablet 0.1 mg  0.1 mg Oral QAC breakfast Kristeen Mans, NP      . dicyclomine (BENTYL) tablet 20 mg  20 mg Oral Q6H PRN Kristeen Mans, NP      . haloperidol (HALDOL) tablet 5 mg  5 mg Oral BID Kristeen Mans, NP      . hydrOXYzine (ATARAX/VISTARIL) tablet 25 mg  25 mg Oral Q6H PRN Kristeen Mans, NP      . lamoTRIgine (LAMICTAL) tablet 25 mg  25 mg Oral Daily Kristeen Mans, NP      . loperamide (IMODIUM) capsule 2-4 mg  2-4 mg Oral PRN Kristeen Mans, NP      . LORazepam (ATIVAN) tablet 1 mg  1 mg Oral QID Kristeen Mans, NP   1 mg at 05/01/14 1349   Followed by  . [START ON 05/02/2014] LORazepam (ATIVAN) tablet 1 mg  1 mg Oral TID Kristeen Mans, NP       Followed by  . [START ON 05/03/2014] LORazepam (ATIVAN) tablet 1 mg  1 mg Oral BID Kristeen Mans, NP       Followed by  . [START ON 05/05/2014] LORazepam (ATIVAN) tablet 1 mg  1 mg Oral Daily Kristeen Mans, NP      . methocarbamol (ROBAXIN) tablet 500 mg  500 mg Oral Q8H PRN Kristeen Mans, NP      . naproxen (NAPROSYN) tablet 500 mg  500 mg Oral BID PRN Kristeen Mans, NP      . nicotine (NICODERM CQ - dosed in mg/24 hours) patch 21 mg  21 mg Transdermal Daily Ethelda Chick, MD   21 mg at 05/01/14 1610  . ondansetron (ZOFRAN-ODT) disintegrating tablet 4 mg  4 mg  Oral  Q6H PRN Kristeen Mans, NP      . zolpidem (AMBIEN) tablet 5 mg  5 mg Oral QHS PRN Ethelda Chick, MD       Current Outpatient Prescriptions  Medication Sig Dispense Refill  . celecoxib (CELEBREX) 200 MG capsule Take 1 capsule (200 mg total) by mouth 2 (two) times daily. 60 capsule 0  . clonazePAM (KLONOPIN) 0.5 MG tablet Take 0.5 mg by mouth 3 (three) times daily as needed for anxiety.    . cloNIDine (CATAPRES) 0.1 MG tablet Take 0.1 mg by mouth 3 (three) times daily.    Marland Kitchen gabapentin (NEURONTIN) 300 MG capsule Take 2 capsules (600 mg total) by mouth 3 (three) times daily. 270 capsule 3  . hydrOXYzine (ATARAX/VISTARIL) 25 MG tablet Take 1 tablet (25 mg total) by mouth every 6 (six) hours as needed for anxiety. 60 tablet 3  . meclizine (ANTIVERT) 25 MG tablet Take 1 tablet (25 mg total) by mouth 3 (three) times daily as needed for dizziness or nausea. 30 tablet 0  . metFORMIN (GLUCOPHAGE) 500 MG tablet Take 1 tablet (500 mg total) by mouth 2 (two) times daily with a meal. 180 tablet 3  . methadone (DOLOPHINE) 5 MG/5ML solution Take 45 mg by mouth daily.    . nicotine (NICODERM CQ - DOSED IN MG/24 HOURS) 21 mg/24hr patch Place 1 patch (21 mg total) onto the skin daily at 6 (six) AM. 28 patch 0  . OLANZapine (ZYPREXA) 15 MG tablet Take 1 tablet (15 mg total) by mouth at bedtime. 30 tablet 3  . QUEtiapine (SEROQUEL) 100 MG tablet Take 100-150 mg by mouth at bedtime.    . cloNIDine (CATAPRES - DOSED IN MG/24 HR) 0.1 mg/24hr patch Place 1 patch (0.1 mg total) onto the skin once a week. (Patient not taking: Reported on 05/01/2014) 4 patch 12  . lamoTRIgine (LAMICTAL) 25 MG tablet Take 1 tablet (25 mg total) by mouth daily. (Patient not taking: Reported on 05/01/2014) 30 tablet 3  . mirtazapine (REMERON) 7.5 MG tablet Take 1 tablet (7.5 mg total) by mouth at bedtime. (Patient not taking: Reported on 05/01/2014) 30 tablet 3  . polyethylene glycol (MIRALAX / GLYCOLAX) packet Take 17 g by mouth 2 (two)  times daily. Hold if you develop diarrhea (Patient not taking: Reported on 03/24/2014) 14 each 0  . senna-docusate (SENOKOT-S) 8.6-50 MG per tablet Take 1 tablet by mouth at bedtime. Hold if you develop diarrhea (Patient not taking: Reported on 03/24/2014)      Musculoskeletal: Strength & Muscle Tone: within normal limits Gait & Station: normal Patient leans: N/A  Psychiatric Specialty Exam:     Blood pressure 114/60, pulse 74, temperature 98.7 F (37.1 C), temperature source Oral, resp. rate 17, last menstrual period 03/15/2014, SpO2 100 %.There is no weight on file to calculate BMI.  General Appearance: Fairly Groomed  Patent attorney::  Fair  Speech:  Clear and Coherent and Normal Rate  Volume:  Normal  Mood:  Anxious, Depressed, Hopeless, Irritable and Worthless  Affect:  Congruent, Depressed and Tearful  Thought Process:  Circumstantial and Coherent  Orientation:  Full (Time, Place, and Person)  Thought Content:  Hallucinations: Auditory Command:  To kill herself  Suicidal Thoughts:  Yes.  with intent/plan  Homicidal Thoughts:  No  Memory:  Immediate;   Fair Recent;   Fair Remote;   Fair  Judgement:  Fair  Insight:  Fair  Psychomotor Activity:  Restlessness  Concentration:  Fair  Recall:  Fair  Fund of Knowledge:Good  Language: Good  Akathisia:  No  Handed:  Right  AIMS (if indicated):     Assets:  Communication Skills Desire for Improvement Housing Physical Health  ADL's:  Intact  Cognition: WNL  Sleep:      Medical Decision Making: Review of Psycho-Social Stressors (1), Review or order clinical lab tests (1), Established Problem, Worsening (2), Review or order medicine tests (1), Review of Medication Regimen & Side Effects (2) and Review of New Medication or Change in Dosage (2)  Treatment Plan Summary: Daily contact with patient to assess and evaluate symptoms and progress in treatment and Medication management  Plan:  Recommend psychiatric Inpatient admission  when medically cleared.  Disposition:  -Seek inpatient psychiatric hospitalization when bed available  -Restart home meds of Lamictal and Neurontin for mood stabilization; restart Remeron  -Start Clonidine detox protocol -Start Ativan detox protocol -Start Haldol 5 mg PO BID for psychosis -Start Cogentin 1 mg PO QD for EPS symptoms related to Haldol  Alberteen Sam, FNP-BC [facility] 05/01/2014 1:50 PM Patient seen face-to-face for psychiatric evaluation, chart reviewed and case discussed with the physician extender and developed treatment plan. Reviewed the information documented and agree with the treatment plan. Thedore Mins, MD

## 2014-05-01 NOTE — [facility] Counselor (Signed)
Accepted to 502-2 per Thurman CoyerEric Kaplan accepting Dr. Elna BreslowEappen. Number for report: [phone]  Kateri PlummerKristin Celestino Ackerman, M.S., LPCA, Bridget HartshornLCASA, [facility] Licensed Professional Counselor Associate  Triage Specialist  [facility]  Therapeutic Triage Services Phone: [phone] Fax: [phone]
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
## 2014-05-01 NOTE — ED Notes (Signed)
Psychiatry at bedside.

## 2014-05-01 NOTE — BH Assessment (Signed)
BHH Assessment Progress Note  Per Thedore MinsMojeed Akintayo, MD this pt requires psychiatric hospitalization at this time.  Thurman CoyerEric Kaplan, RN, [facility] has assigned pt to Rm 502-2.  Pt has signed Voluntary Admission and Consent for Treatment, as well as Consent to Release Information to [facility], her outpatient provider, and a notification call has been placed.  Signed [facility].  Pt's nurse, Minerva AreolaEric, has been informed.  He agrees to send original paperwork along with pt via [facility], and to call report to [phone].  Doylene Canninghomas Hyacinth Marcelli, MA Triage Specialist 05/01/2014 @ 15:38

## 2014-05-01 NOTE — ED Notes (Signed)
Pt presents with c/o suicidal thoughts, withdrawal from benzos, and says that she is hearing voices. Pt reports her last use of benzos was yesterday. Pt reports that the voices are telling her to kill herself and that she is worthless and they are telling her ways to kill herself. Pt reports the voices are telling her to run out in front of a car or overdose. Pt is tearful in triage, denies HI.

## 2014-05-02 ENCOUNTER — Encounter (HOSPITAL_COMMUNITY): Payer: Self-pay | Admitting: Psychiatry

## 2014-05-02 DIAGNOSIS — Z634 Disappearance and death of family member: Secondary | ICD-10-CM

## 2014-05-02 DIAGNOSIS — F1121 Opioid dependence, in remission: Secondary | ICD-10-CM | POA: Diagnosis present

## 2014-05-02 DIAGNOSIS — F1390 Sedative, hypnotic, or anxiolytic use, unspecified, uncomplicated: Secondary | ICD-10-CM

## 2014-05-02 DIAGNOSIS — F1190 Opioid use, unspecified, uncomplicated: Secondary | ICD-10-CM

## 2014-05-02 DIAGNOSIS — F315 Bipolar disorder, current episode depressed, severe, with psychotic features: Principal | ICD-10-CM

## 2014-05-02 DIAGNOSIS — F1320 Sedative, hypnotic or anxiolytic dependence, uncomplicated: Secondary | ICD-10-CM | POA: Diagnosis present

## 2014-05-02 DIAGNOSIS — F13932 Sedative, hypnotic or anxiolytic use, unspecified with withdrawal with perceptual disturbances: Secondary | ICD-10-CM | POA: Diagnosis present

## 2014-05-02 DIAGNOSIS — F13232 Sedative, hypnotic or anxiolytic dependence with withdrawal with perceptual disturbance: Secondary | ICD-10-CM | POA: Diagnosis present

## 2014-05-02 DIAGNOSIS — F4310 Post-traumatic stress disorder, unspecified: Secondary | ICD-10-CM | POA: Diagnosis present

## 2014-05-02 LAB — URINALYSIS W MICROSCOPIC (NOT AT ARMC)
Bilirubin Urine: NEGATIVE
Glucose, UA: NEGATIVE mg/dL
Ketones, ur: NEGATIVE mg/dL
LEUKOCYTES UA: NEGATIVE
NITRITE: NEGATIVE
PROTEIN: NEGATIVE mg/dL
Specific Gravity, Urine: 1.01 (ref 1.005–1.030)
Urobilinogen, UA: 0.2 mg/dL (ref 0.0–1.0)
pH: 6 (ref 5.0–8.0)

## 2014-05-02 LAB — URINE CULTURE: Colony Count: >=100000

## 2014-05-02 LAB — PREGNANCY, URINE: Preg Test, Ur: NEGATIVE

## 2014-05-02 MED ORDER — METHADONE HCL 5 MG PO TABS
45.0000 mg | ORAL_TABLET | Freq: Every day | ORAL | Status: DC
  Administered 2014-05-02 – 2014-05-05 (×4): 45 mg via ORAL
  Filled 2014-05-02 (×4): qty 9

## 2014-05-02 MED ORDER — LORAZEPAM 1 MG PO TABS
1.0000 mg | ORAL_TABLET | Freq: Three times a day (TID) | ORAL | Status: AC
  Administered 2014-05-03 (×3): 1 mg via ORAL
  Filled 2014-05-02 (×3): qty 1

## 2014-05-02 MED ORDER — FLUCONAZOLE 150 MG PO TABS
150.0000 mg | ORAL_TABLET | Freq: Once | ORAL | Status: AC
  Administered 2014-05-02: 150 mg via ORAL
  Filled 2014-05-02: qty 1

## 2014-05-02 MED ORDER — SULFAMETHOXAZOLE-TRIMETHOPRIM 400-80 MG PO TABS
1.0000 | ORAL_TABLET | Freq: Two times a day (BID) | ORAL | Status: DC
  Administered 2014-05-02 – 2014-05-05 (×7): 1 via ORAL
  Filled 2014-05-02 (×3): qty 1
  Filled 2014-05-02: qty 5
  Filled 2014-05-02 (×6): qty 1

## 2014-05-02 MED ORDER — LORAZEPAM 1 MG PO TABS
1.0000 mg | ORAL_TABLET | Freq: Two times a day (BID) | ORAL | Status: AC
  Administered 2014-05-04 (×2): 1 mg via ORAL
  Filled 2014-05-02 (×2): qty 1

## 2014-05-02 MED ORDER — LORAZEPAM 1 MG PO TABS: 1.0000 mg | ORAL_TABLET | Freq: Four times a day (QID) | ORAL | Status: AC | PRN

## 2014-05-02 MED ORDER — NICOTINE POLACRILEX 2 MG MT GUM
2.0000 mg | CHEWING_GUM | OROMUCOSAL | Status: DC | PRN
  Administered 2014-05-02 – 2014-05-04 (×7): 2 mg via ORAL
  Filled 2014-05-02 (×3): qty 1

## 2014-05-02 MED ORDER — THIAMINE HCL 100 MG/ML IJ SOLN
100.0000 mg | Freq: Once | INTRAMUSCULAR | Status: AC
Start: 2014-05-02 — End: 2014-05-02
  Administered 2014-05-02: 100 mg via INTRAMUSCULAR
  Filled 2014-05-02: qty 2

## 2014-05-02 MED ORDER — TRAZODONE HCL 50 MG PO TABS
50.0000 mg | ORAL_TABLET | Freq: Every evening | ORAL | Status: DC | PRN
  Administered 2014-05-02 – 2014-05-04 (×3): 50 mg via ORAL
  Filled 2014-05-02 (×2): qty 1
  Filled 2014-05-02: qty 14

## 2014-05-02 MED ORDER — LORAZEPAM 1 MG PO TABS
1.0000 mg | ORAL_TABLET | Freq: Four times a day (QID) | ORAL | Status: AC
Start: 2014-05-02 — End: 2014-05-02
  Administered 2014-05-02 (×3): 1 mg via ORAL
  Filled 2014-05-02 (×3): qty 1

## 2014-05-02 MED ORDER — ADULT MULTIVITAMIN W/MINERALS CH
1.0000 | ORAL_TABLET | Freq: Every day | ORAL | Status: DC
  Administered 2014-05-02 – 2014-05-05 (×4): 1 via ORAL
  Filled 2014-05-02 (×6): qty 1

## 2014-05-02 MED ORDER — DIPHENHYDRAMINE HCL 50 MG/ML IJ SOLN
50.0000 mg | Freq: Once | INTRAMUSCULAR | Status: AC
  Administered 2014-05-02: 50 mg via INTRAMUSCULAR

## 2014-05-02 MED ORDER — LORAZEPAM 1 MG PO TABS
1.0000 mg | ORAL_TABLET | Freq: Every day | ORAL | Status: AC
  Administered 2014-05-05: 1 mg via ORAL
  Filled 2014-05-02: qty 1

## 2014-05-02 MED ORDER — QUETIAPINE FUMARATE 100 MG PO TABS
100.0000 mg | ORAL_TABLET | Freq: Every day | ORAL | Status: DC
  Administered 2014-05-02 – 2014-05-03 (×2): 100 mg via ORAL
  Filled 2014-05-02 (×3): qty 1

## 2014-05-02 MED ORDER — DIPHENHYDRAMINE HCL 50 MG/ML IJ SOLN
INTRAMUSCULAR | Status: AC
  Filled 2014-05-02: qty 1

## 2014-05-02 MED ORDER — VITAMIN B-1 100 MG PO TABS
100.0000 mg | ORAL_TABLET | Freq: Every day | ORAL | Status: DC
  Administered 2014-05-03 – 2014-05-05 (×3): 100 mg via ORAL
  Filled 2014-05-02 (×4): qty 1

## 2014-05-02 NOTE — B[facility] Group Notes (Signed)
The focus of this group is to educate the patient on the purpose and policies of crisis stabilization and provide a format to answer questions about their admission.  The group details unit policies and expectations of patients while admitted. Patient did not attend this group. 

## 2014-05-02 NOTE — Progress Notes (Signed)
D: Pt denies SI/HI/AVH. Pt is pleasant and cooperative. Pt remained in bed all evening.   A: Pt was offered support and encouragement. Pt was given scheduled medications. Pt was encourage to attend groups. Q 15 minute checks were done for safety.   R: Pt is taking medication.Pt receptive to treatment and safety maintained on unit.

## 2014-05-02 NOTE — Tx Team (Signed)
Interdisciplinary Treatment Plan Update (Adult)  Date:  05/02/2014 Time Reviewed:  9:51 AM  Progress in Treatment: Attending groups: No. Participating in groups:  No. Taking medication as prescribed:  Yes. Tolerating medication:  Yes., MD wants documentation from methadone clinic re dosage of methadone, CSW attempting to secure records Family/Significant othe contact made:  No, will contact:  collateral w patient consent Patient understands diagnosis:  Yes. Discussing patient identified problems/goals with staff:  Yes. Medical problems stabilized or resolved:  Yes.  MD assessing for need for continuation of methadone Denies suicidal/homicidal ideation: No.Admitted for potential overdose attempt vs improper use of medications.     New problem(s) identified:  [name] is a 29 y.o. female patient who presented voluntarily to [facility] ED for evaluation of "I started hearing voices telling me to kill myself." States "I took 20 tabs of Klonopin yesterday to calm the voices by trying to makes myself to go to sleep."   HPI: [name] is a 29 yo Caucasian female who reports she started hearing voices yesterday. States she took 20 of her Klonopin tabs to go to sleep so she would not hear the voices. States she has a history of bipolar but this is the first time she has heard voices and "it is scaring me." She says the voices came on all of a sudden while she was standing in line at the gas station and describes the voice as a man's voice. Her mother has a history of psychosis which concerns her.She reports she is on Lamictal and Seroquel was prescribed but she did not pick up this prescription. States she she is currently on a Methadone taper of 45 mg from the methadone clinic for prior heroin addiction. 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 Her stressors include her husband divorced her last year and she gave her 895  year old daughter up for adoption 2 weeks ago which has caused increased depression. States states she uses marijuana occasionally with last use being 2 weeks ago. She lives with her boyfriend. [facility]. She endorses suicidal ideation with plan to run out in front of a car or overdose on her meds. She denies homicidal ideation, intent or plan. She denies visual hallucinations. She has been hospitalized in the past with [facility].   Discharge Plan or Barriers:  Need for medication stabilization, recent loss AEB relinquishment of child for adoption, lack of access to outpatient medications.  Reason for Continuation of Hospitalization: Depression Suicidal ideation Withdrawal symptoms  Comments:  Estimated length of stay:  5 - 7 days  Attendees: Patient:   2/23/20169:51 AM  Family:   2/23/20169:51 AM  Physician:  Noel ChristmasSarmanna Eappen, MD 2/23/20169:51 AM  Nursing:   Vernelle EmeraldSarah Tywan, RN; Britney RN; La FontaineBrittany RN 2/23/20169:51 AM  Social Worker:   Lowella Dellatherine Harrill,LCSW 2/23/20169:51 AM  Social Worker:  Santa GeneraAnne Cunningham, LCSW; BorupKristin Drinkard LCSW 2/23/20169:51 AM  Other:  Leisa LenzValerie Enoch, [facility] 2/23/20169:51 AM  Other:   2/23/20169:51 AM  Other:   2/23/20169:51 AM  Other:  2/23/20169:51 AM  Other:  2/23/20169:51 AM  Other:  2/23/20169:51 AM  Other:  2/23/20169:51 AM  Other:  2/23/20169:51 AM  Other:  2/23/20169:51 AM  Other:   2/23/20169:51 AM   Scribe for Treatment Team:   Sallee Langeunningham, [name], 05/02/2014, 9:51 AM

## 2014-05-02 NOTE — H&P (Signed)
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 will start CIWA/ativan protocol for BZD withdrawal sx. Will DC Haldol for EPS. Patient provided with Bendaryl IM 50 MG . Will start Seroquel 100 mg po qhs for AH as well as sleep tonight. Will continue to monitor vitals ,medication compliance and treatment side effects while patient is here.  Will monitor for medical issues as well as call consult as needed.  Reviewed labs ,will order UA,UC,HIV,RPR,GC/CL,EKG - patient wants to get tested for STDs.  Patient is symptomatic for UTI - will start Bactrim. Will provide Diflucan 150 mg x1 dose for foul odor as well as discharge. CSW will start working on disposition.  Patient to participate in therapeutic milieu .       Medical Decision Making:   Review of Psycho-Social Stressors (1), Review or order clinical lab tests (1), Established Problem, Worsening (2), Review or order medicine tests (1), Review of Medication Regimen & Side Effects (2) and Review of New Medication or Change in Dosage (2)  I certify that inpatient services furnished can reasonably be expected to improve the patient's condition.   Cagney Steenson md 2/23/20161:42 PM

## 2014-05-02 NOTE — Progress Notes (Signed)
D: Patient complains of "stiffness in my jaw." Pt observed with teeth moderately clinched and lips mildly pursed. Pt in mild distress and is tearful d/t withdrawal symptoms and new symptoms. Pt's speech is coherent, and mildly slurred. Pt with MD at [phone]. A: MD Eappen, made aware. Benadry order placed by MD and ok to give prior to pharmacist review per MD. Order acknowledged and adminstered by RN at [phone] (See MAR). Will reassess. Encouragement/Support provided to pt.  R: Pt tolerated injection well.

## 2014-05-02 NOTE — Progress Notes (Signed)
Patient ID: [name], female   DOB: 01/26/1986, 29 y.o.   MRN: [id number] Referral faxed to [employer] [facility] with patient's initials and request for support. Carney Bernatherine C Essie Gehret, LCSW

## 2014-05-02 NOTE — BHH Group Notes (Signed)
BHH LCSW Group Therapy  05/02/2014 1:15 PM   Type of Therapy: Group Therapy  Participation Level: Active  Participation Quality: Sharing and Supportive of others  Affect: Depressed and Flat  Cognitive:Oriented and sleepy to point of nodding out at least once  Insight: Developing  Engagement in Therapy: Developing  Modes of Intervention: Clarification, Discussion, Exploration, Orientation, Rapport Building, Reality Testing, Socialization and Support  Summary of Progress/Problems: The topic for group therapy was feelings about diagnosis. Pt identified how society and family members judge them, based on their diagnosis as well as stereotypes and stigmas without knowing anything about their background. Patient whom has had some success with recovery from opiates shared with others how she maintains support with family members and community resources such as church group.

## 2014-05-02 NOTE — Progress Notes (Signed)
D: Patient is alert and oriented. Pt's mood and affect is depressed, sad, worried, anxious at times, and blunted. Pt's eye contact is fair. Pt denies SI/HI and VH. Pt reports auditory hallucinations of "whispers" and "echos." Pt rates depression and hopelessness 9/10 and anxiety 10/10. Pt complains of withdrawal symptoms including all over aches 10/10, diarrhea, general fatigue and anxiety, pt is requesting methadone treatment. Pt complains of jaw "stiffness" this morning at 0930 with relief from medication (See additional note). Pt reports her goal for the day is "getting my medicine." Pt BP was low this morning (See docflowsheet-vitals), clonidine dose held. Pt reports she is starting a new job on Monday and is hoping to discharge from the hospital by then in order to start working. A: Encouragement/Support provided to pt. Active listening by RN. Pt encouraged to increase fluids, will reassess BP frequently. MD, Eappen made aware of pt's complaints and symptoms. Urine sample collected and awaiting lab pick up. Medication education reviewed with pt. PRN medication administered for diarrhea, anxiety, and nicotine craving per providers orders (See MAR). Scheduled medications administered per providers orders (See MAR). 15 minute checks continued per protocol for patient safety.  R: Patient cooperative and receptive to nursing interventions. Pt remains safe.

## 2014-05-02 NOTE — Progress Notes (Signed)
Patient ID: [name], female   DOB: 01/21/1986, 29 y.o.   MRN: [id number] Followed up with patient's request to contact [facility] re "new methadone detox plan". As per [facility] intact there is no such plan they continue to be abstinence based program. Informed patient who will follow [facility] [place]  Modena Morrowatherine C Union[place], [place]

## 2014-05-02 NOTE — Progress Notes (Signed)
Adult Psychoeducational Group Note  Date:  05/02/2014 Time:  8:39 PM  Group Topic/Focus:  Wrap-Up Group:   The focus of this group is to help patients review their daily goal of treatment and discuss progress on daily workbooks.  Participation Level:  Active  Participation Quality:  Appropriate  Affect:  Appropriate  Cognitive:  Appropriate  Insight: Appropriate  Engagement in Group:  Engaged  Modes of Intervention:  Discussion  Additional Comments:  The patient expressed that she attend the realistic goals group today.The patient also said that her goal was to handle her issues with Doctors which they did handle.  Octavio Mannshigpen, Karthika Glasper Lee 05/02/2014, 8:39 PM
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
## 2014-05-02 NOTE — BHH Suicide Risk Assessment (Signed)
BHH INPATIENT:  Family/Significant Other Suicide Prevention Education  Suicide Prevention Education:  Education Completed; [name] at [phone] has been identified by the patient as the family member/significant other with whom the patient will be residing, and identified as the person(s) who will aid the patient in the event of a mental health crisis (suicidal ideations/suicide attempt).  With written consent from the patient, the family member/significant other has been provided the following suicide prevention education, prior to the and/or following the discharge of the patient.  The suicide prevention education provided includes the following:  Suicide risk factors  Suicide prevention and interventions  National Suicide Hotline telephone number  [facility] assessment telephone number  [facility] Emergency Assistance 911  [facility] and/or Residential Mobile Crisis Unit telephone number  Request made of family/significant other to:  Remove weapons (e.g., guns, rifles, knives), all items previously/currently identified as safety concern.  [name] reports there are no firearms in the home  Remove drugs/medications (over-the-counter, prescriptions, illicit drugs), all items previously/currently identified as a safety concern. [name] reports medications are secured  The family member/significant other verbalizes understanding of the suicide prevention education information provided.  The family member/significant other agrees to remove the items of safety concern listed above.  [name], [name] 05/02/2014, 4:27 PM

## 2014-05-03 LAB — URINE CULTURE: SPECIAL REQUESTS: NORMAL

## 2014-05-03 LAB — RPR: RPR: NONREACTIVE

## 2014-05-03 LAB — GC/CHLAMYDIA PROBE AMP (~~LOC~~) NOT AT ARMC
CHLAMYDIA, DNA PROBE: NEGATIVE
Neisseria Gonorrhea: NEGATIVE

## 2014-05-03 NOTE — BHH Group Notes (Signed)
[facility] Note   05/03/2014 11:24 AM  Participation Quality: Active    Mood/Affect:  Flat  Depression Rating:  2  Anxiety Rating:  4  Thoughts of Suicide:  No Will you contract for safety?   NA  Current AVH:  No  Plan for Discharge/Comments:  [name] plans to return home and [facility] [place]. She receives substance abuse counseling as well as group therapy and medication management.    Transportation Means: Family   Supports:Family   Hyatt,[name]

## 2014-05-03 NOTE — Clinical Social Work Note (Signed)
Per [facility] hospital liaison, patient has [facility]- Glenda Hedenskog @ [phone].  [facility].  Santa GeneraAnne Riot Waterworth, LCSW Clinical Social Worker

## 2014-05-03 NOTE — Plan of Care (Signed)
Problem: Diagnosis: Increased Risk For Suicide Attempt Goal: LTG-Patient Will Show Positive Response to Medication LTG (by discharge) : Patient will show positive response to medication and will participate in the development of the discharge plan.  Outcome: Progressing Pt stated she felt better today, and wants to get of medications ASAP, but she knows it will be a while before she is off the Suboxone Goal: LTG-Patient Will Report Absence of Withdrawal Symptoms LTG (by discharge): Patient will report absence of withdrawal symptoms.  Outcome: Progressing Pt did not endorse any Sx other than anxiety this evening  Problem: Alteration in mood & ability to function due to Goal: STG-Patient will attend groups Outcome: Progressing Pt stated she attended all her groups for the day and evening

## 2014-05-03 NOTE — Progress Notes (Addendum)
D: Pt denies SI/HI/AVH. Pt is pleasant and cooperative. Pt appears less anxious tonight, said it was because she was able to get her medication. Pt appears to just like taking medication. A: Pt was offered support and encouragement. Pt was given scheduled medications. Pt was encourage to attend groups. Q 15 minute checks were done for safety.   R:Pt attends groups and interacts well with peers and staff. Pt is taking medication. Pt has no complaints at this time .Pt receptive to treatment and safety maintained on unit.
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
## 2014-05-03 NOTE — BHH Group Notes (Signed)
BHH LCSW Group Therapy  05/03/2014 1:22 PM  Type of Therapy:  Group Therapy  Participation Level:  Did Not Attend  Modes of Intervention:  Education, Socialization and Support  Summary of Progress/Problems:Mental Health Association (MHA) speaker came to talk about his personal journey with substance abuse and mental illness. Group members were challenged to process ways by which to relate to the speaker. MHA speaker provided handouts and educational information pertaining to groups and services offered by the [facility].   Gould,Melissa 05/03/2014, 1:22 PM
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
## 2014-05-03 NOTE — Progress Notes (Addendum)
D: Patient is alert and oriented. Pt's mood and affect is depressed and blunted. Pt's eye contact is fair. Pt denies SI/HI and AVH. Pt reports depression and hopelessness 2/10 and anxiety 3/10. Pt reports her goal for the day is "stay positive, use coping skills." Pt is attending groups. Pt complains of nicotine craving today with relief from PRN medication. Pt reports generalized pain 8/10 this morning with 3/10 relief from scheduled medications. Pt complains of anxiety this afternoon which decreased with PRN medication. Pt sleeping in bed this afternoon. Pt complained of anxiety this evening d/t her discharge plans. Pt's experiencing hypotension this evening (See docflowsheet-vitals), pt denies symptoms. A: Contacted Matt, Chaplin at 1100 per providers orders, had 1:1 session with pt this afternoon at 1330. Active listening by RN. Encouragement/Support provided to pt. EKG completed. Pt given fluids and encouraged to increased fluids d/t blood pressure concerns. PRN medications administered for nicotine craving and anxiety per providers orders (See MAR). Scheduled medications administered per providers orders (See MAR). 15 minute checks continued per protocol for patient safety.  R: Patient cooperative and receptive to nursing interventions. Pt remains safe. Pt tolerated EKG test with ease.

## 2014-05-04 MED ORDER — QUETIAPINE FUMARATE 200 MG PO TABS
200.0000 mg | ORAL_TABLET | Freq: Every day | ORAL | Status: DC
  Administered 2014-05-04: 200 mg via ORAL
  Filled 2014-05-04 (×2): qty 1

## 2014-05-04 NOTE — [facility] Group Notes (Signed)
[facility] LCSW Group Therapy  05/04/2014 2:47 PM  Type of Therapy:  Group Therapy  Participation Level:  Active  Participation Quality:  Drowsy  Affect:  Flat  Cognitive:  Appropriate  Insight:  Improving  Engagement in Therapy:  Engaged  Modes of Intervention:  Discussion, Socialization and Support  Summary of Progress/Problems:Feelings around Relapse. Group members discussed the meaning of relapse and shared personal stories of relapse, how it affected them and others, and how they perceived themselves during this time. Group members were encouraged to identify triggers, warning signs and coping skills used when facing the possibility of relapse. Social supports were discussed and explored in detail. [name] identified sadness as an emotion that is difficult for her to control. She was told by a family member that her cousin died this morning. She has experienced a lot of loss in her life. When she is sad, she isolates herself from others and uses drugs. [name] identified listening to music and talking to her supports as healthier coping skills.    Gould,Melissa Dubie 05/04/2014, 2:47 PM

## 2014-05-04 NOTE — Progress Notes (Signed)
D   Pt is appropriate in her interactions with others   ahe attended group and participated   She is anxious and complains of some mild withdrawal symptoms which include anxiety and body aches A   Verbal support given  Medications administered and effectiveness monitored   Q 15 min checks   Discussed withdrawal symptoms and theraputic use of medications R    Pt safe at present and verbalizes understanding

## 2014-05-04 NOTE — Plan of Care (Signed)
Problem: Alteration in thought process Goal: STG-Patient is able to follow short directions Outcome: Progressing Pt has been able to follow short directions on unit related to meals, medications and group schedules without problems thus far this shift.
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
## 2014-05-04 NOTE — Progress Notes (Signed)
D: Pt visible in milieu at intervals during shift.  A: Safety maintained on Q 15 minutes checks as ordered without behavioral outburst. All medications given as per MD's orders. Support and encouragement offered.  R: Pt reported increased anxiety level this shift related to cousin passing in her sleep last night as reported by her family via telephone this AM. Verbally contracted for safety with SI. Denied AVH, HI and pain when assessed. Attended all scheduled groups. Verbalized her concerns safely to RN. Remains safe on / off unit.

## 2014-05-05 MED ORDER — LAMOTRIGINE 25 MG PO TABS: 25.0000 mg | ORAL_TABLET | Freq: Every day | ORAL | Status: AC

## 2014-05-05 MED ORDER — GABAPENTIN 300 MG PO CAPS: 600.0000 mg | ORAL_CAPSULE | Freq: Three times a day (TID) | ORAL | Status: AC

## 2014-05-05 MED ORDER — METFORMIN HCL 500 MG PO TABS: 500.0000 mg | ORAL_TABLET | Freq: Two times a day (BID) | ORAL | Status: AC

## 2014-05-05 MED ORDER — NICOTINE 21 MG/24HR TD PT24: 21.0000 mg | MEDICATED_PATCH | Freq: Every day | TRANSDERMAL | Status: AC

## 2014-05-05 MED ORDER — SULFAMETHOXAZOLE-TRIMETHOPRIM 400-80 MG PO TABS: 1.0000 | ORAL_TABLET | Freq: Two times a day (BID) | ORAL | Status: DC

## 2014-05-05 MED ORDER — HYDROXYZINE HCL 25 MG PO TABS: 25.0000 mg | ORAL_TABLET | Freq: Four times a day (QID) | ORAL | Status: AC | PRN

## 2014-05-05 MED ORDER — METHADONE HCL 5 MG PO TABS: 45.0000 mg | ORAL_TABLET | Freq: Every day | ORAL | Status: AC

## 2014-05-05 MED ORDER — NICOTINE 21 MG/24HR TD PT24
21.0000 mg | MEDICATED_PATCH | Freq: Every day | TRANSDERMAL | Status: DC
  Filled 2014-05-05: qty 14

## 2014-05-05 MED ORDER — BENZTROPINE MESYLATE 1 MG PO TABS: 1.0000 mg | ORAL_TABLET | Freq: Every day | ORAL | Status: AC

## 2014-05-05 MED ORDER — QUETIAPINE FUMARATE 200 MG PO TABS: 200.0000 mg | ORAL_TABLET | Freq: Every day | ORAL | Status: AC

## 2014-05-05 MED ORDER — GABAPENTIN 600 MG PO TABS
600.0000 mg | ORAL_TABLET | Freq: Three times a day (TID) | ORAL | Status: DC
  Filled 2014-05-05: qty 42

## 2014-05-05 MED ORDER — TRAZODONE HCL 50 MG PO TABS: 50.0000 mg | ORAL_TABLET | Freq: Every evening | ORAL | Status: AC | PRN

## 2014-05-05 NOTE — Discharge Summary (Signed)
Physician Discharge Summary Note  Patient:  [name] is an 29 y.o., female MRN:  [id number] DOB:  07-Oct-1985 Patient phone:  [phone] (home)  Patient address:   [address] [place] [address],  Total Time spent with patient: Greater than 30 minutes  Date of Admission:  05/01/2014  Date of Discharge: 05-05-14  Reason for Admission: Mood stabilization treatment  Principal Problem: Bipolar affective disorder, depressed, severe, with psychotic behavior Discharge Diagnoses: Patient Active Problem List   Diagnosis Date Noted  . 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 Opioid use disorder, moderate, in early remission, on maintenance therapy [F11.90] 05/02/2014  . Benzodiazepine withdrawal with perceptual disturbance [F13.232] 05/02/2014  . Bereavement [Z63.4] 05/02/2014  . PTSD (post-traumatic stress disorder) [F43.10] 05/02/2014  . Bipolar affective disorder, depressed, severe, with psychotic behavior [F31.5] 05/01/2014  . BV (bacterial vaginosis) [N76.0, A49.9]   . Hepatitis C [B19.20] 08/27/2013  . UTI (urinary tract infection) [N39.0] 08/27/2013  . GERD (gastroesophageal reflux disease) [K21.9] 08/26/2013  . Transaminitis [R74.0] 08/25/2013  . Hidradenitis [L73.2] 12/07/2012   Musculoskeletal: Strength & Muscle Tone: within normal limits Gait & Station: normal Patient leans: N/A  Psychiatric Specialty Exam: Physical Exam  Psychiatric: Her speech is normal and behavior is normal. Judgment and thought content normal. Her mood appears not anxious. Her affect is not angry, not blunt, not labile and not inappropriate. Cognition and memory are normal. She does not exhibit a depressed mood.    Review of Systems  Constitutional: Negative.   HENT: Negative.   Eyes: Negative.   Respiratory: Negative.   Cardiovascular: Negative.   Gastrointestinal: Negative.   Genitourinary: Negative.   Musculoskeletal: Negative.   Skin: Negative.    Neurological: Negative.   Endo/Heme/Allergies: Negative.   Psychiatric/Behavioral: Positive for depression (Stable), hallucinations (Hx of) and substance abuse (Hx opioid addiction). Negative for suicidal ideas and memory loss. The patient has insomnia (Stable). The patient is not nervous/anxious.     Blood pressure 100/50, pulse 72, temperature 98.4 F (36.9 C), temperature source Oral, resp. rate 18, height 5\' 5"  (1.651 m), weight 77.565 kg (171 lb), last menstrual period 03/15/2014, SpO2 99 %.Body mass index is 28.46 kg/(m^2).  [name]   Past Medical History:  Past Medical History  Diagnosis Date  . Drug abuse   . GERD (gastroesophageal reflux disease)   . Depression   . Arthritis   . Seizures   . Chronic pain   . Methadone maintenance therapy patient   . Anxiety     Past Surgical History  Procedure Laterality Date  . Appendectomy    . Cesarean section    . Mandible fracture surgery     Family History:  Family History  Problem Relation Age of Onset  . Mental illness Mother   . Mental illness Brother   . Diabetes Maternal Grandmother   . Mental illness Maternal Grandmother   . Hypertension Maternal Grandfather   . Diabetes Paternal Grandmother   . Heart disease Paternal Grandmother    Social History:  History  Alcohol Use No    Comment: Quit three months ago     History  Drug Use  . Yes  . Special: Heroin, Marijuana, Benzodiazepines    Comment: Hx of use- IV drug use.  Clean since 01/2007    History   Social History  . Marital Status: Legally Separated    Spouse Name: N/A  . Number of Children: N/A  . Years of Education: N/A   Social History Main  Topics  . Smoking status: Current Some Day Smoker -- 1.00 packs/day for 5 years    Last Attempt to Quit: 01/13/2011  . Smokeless tobacco: Never Used  . Alcohol Use: No     Comment: Quit three months ago  . Drug Use: Yes    Special: Heroin, Marijuana, Benzodiazepines     Comment: Hx of use- IV drug use.  Clean  since 01/2007  . Sexual Activity: Yes   Other Topics Concern  . None   Social History Narrative   ** Merged History Encounter **       Risk to Self: Is patient at risk for suicide?: Yes What has been your use of drugs/alcohol within the last 12 months?: Methadone prescribed 45 mg Daily; Cloniopin perscribed .7750m 3xD; additionally patient will use up to 20 mg Clonipin or Valium daily in addition to 1 joint THC monthly Risk to Others: No Prior Inpatient Therapy: No Prior Outpatient Therapy: No Level of Care:  OP  Hospital Course:  [name] is an 29 y.o. Caucasian female who [facility] with complaints of hearing voices to kill herself by jumping into traffic. Patient seen this AM. Patient appeared to be very anxious , shaky , distressed and also complained of stiffness of her jaw this AM. Patient reported that she wants to get her Methadone back , last dose was [facility] when [facility] and got 45 mg tablet. Patient reports a significant hx of heroin, BZD as well as cannabis abuse. Patient is currently on Methadone maintenance and stopped using heroin.  While a patient in this hospital, [name] received medication management for mood control/stabilization. She was medicated & discharged on Cogentin 1 mg daily for prevention of drug induced EPS, Neurontin 300 mg tid for agitation/substance withdrawal symptoms, Hydroxyzine 25 mg Qid for anxiety prn, Lamictal 25 mg Q daily for mood stabilization, Seroquel 200 mg Q bedtime for mood control & Trazodone 50 mg Q bedtime for insomnia. She was also resumed on all her pertinent home medications for her other pre-existing medical issues that required treatment & or monitoring. Duha tolerated her treatment regimen without any adverse effects and or reactions.   Melissa Folksmanda was also enrolled & participated in the group counseling sessions being offered & held on this unit. She learned coping skills. [name]'s symptoms responded  well to her treatment regimen. This is evidenced by her reports of improved mood & absence of suicidal ideations. She is currently being discharged to continue psychiatric treatment at the [facility] [place] & the [facility] for her Methadone treatment. She is provided with all the necessary information needed to make these appointments without problems.  Upon discharge, she adamantly denies any SIHI, AVH, delusional thoughts, paranoia and or substance withdrawal syndrome. Melissa Folksmanda [facility], a 14 days worth, supply samples of her [facility] medications. [facility] with all personal belongings in no distress. Transportation per significant order.  Consults:  psychiatry  Significant Diagnostic Studies:  labs: CBC with diff, CMP, UDS, toxicology tests, U/A  Discharge Vitals:   Blood pressure 100/50, pulse 72, temperature 98.4 F (36.9 C), temperature source Oral, resp. rate 18, height 5\' 5"  (1.651 m), weight 77.565 kg (171 lb), last menstrual period 03/15/2014, SpO2 99 %. Body mass index is 28.46 kg/(m^2). Lab Results:   Results for orders placed or performed during the hospital encounter of 05/01/14 (from the past 72 hour(s))  Urinalysis with  microscopic     Status: Abnormal   Collection Time: 05/02/14  1:46 PM  Result Value Ref Range   Color, Urine YELLOW YELLOW   APPearance CLEAR CLEAR   Specific Gravity, Urine 1.010 1.005 - 1.030   pH 6.0 5.0 - 8.0   Glucose, UA NEGATIVE NEGATIVE mg/dL   Hgb urine dipstick LARGE (A) NEGATIVE   Bilirubin Urine NEGATIVE NEGATIVE   Ketones, ur NEGATIVE NEGATIVE mg/dL   Protein, ur NEGATIVE NEGATIVE mg/dL   Urobilinogen, UA 0.2 0.0 - 1.0 mg/dL   Nitrite NEGATIVE NEGATIVE   Leukocytes, UA NEGATIVE NEGATIVE   WBC, UA 0-2 <3 WBC/hpf   RBC / HPF 11-20 <3 RBC/hpf   Squamous Epithelial / LPF RARE RARE    Comment: Performed at [facility]  Urine culture     Status: None   Collection  Time: 05/02/14  1:46 PM  Result Value Ref Range   Specimen Description      URINE, RANDOM Performed at [facility]    Special Requests      Normal Performed at [facility]    Colony Count      1,000 COLONIES/ML Performed at [facility]    Culture      INSIGNIFICANT GROWTH Performed at [facility]    Report Status 05/03/2014 FINAL   Pregnancy, urine     Status: None   Collection Time: 05/02/14  1:46 PM  Result Value Ref Range   Preg Test, Ur NEGATIVE NEGATIVE    Comment:        THE SENSITIVITY OF THIS METHODOLOGY IS >20 mIU/mL. Performed at [facility]   RPR     Status: None   Collection Time: 05/02/14  7:46 PM  Result Value Ref Range   RPR Ser Ql Non Reactive Non Reactive    Comment: (NOTE) Performed At: [facility] [address] [place], [place] [id number] Mila Homer MD JY:[id number] Performed at [facility]    Physical Findings: AIMS: Facial and Oral Movements Muscles of Facial Expression: None, normal Lips and Perioral Area: None, normal Jaw: None, normal Tongue: None, normal,Extremity Movements Upper (arms, wrists, hands, fingers): Minimal (hand tremors) Lower (legs, knees, ankles, toes): None, normal, Trunk Movements Neck, shoulders, hips: None, normal, Overall Severity Severity of abnormal movements (highest score from questions above): None, normal Incapacitation due to abnormal movements: None, normal Patient's awareness of abnormal movements (rate only patient's report): Aware, no distress, Dental Status Current problems with teeth and/or dentures?: No Does patient usually wear dentures?: No  CIWA:  CIWA-Ar Total: 4 COWS:  COWS Total Score: 3   See Psychiatric Specialty Exam and Suicide Risk Assessment completed by Attending Physician prior to discharge.  Discharge destination:  Home  Is patient on multiple antipsychotic therapies at  discharge:  No   Has Patient had three or more failed trials of antipsychotic monotherapy by history:  No  Recommended Plan for Multiple Antipsychotic Therapies: NA    Medication List    STOP taking these medications        ALPRAZolam 0.5 MG tablet  Commonly known as:  XANAX     baclofen 10 MG tablet  Commonly known as:  LIORESAL     celecoxib 200 MG capsule  Commonly known as:  CELEBREX     clonazePAM 1 MG tablet  Commonly known as:  KLONOPIN     cloNIDine 0.1 MG tablet  Commonly known as:  CATAPRES  cloNIDine 0.1 mg/24hr patch  Commonly known as:  CATAPRES - Dosed in mg/24 hr     diazepam 5 MG tablet  Commonly known as:  VALIUM     fluticasone 50 MCG/ACT nasal spray  Commonly known as:  FLONASE     hydrOXYzine 25 MG capsule  Commonly known as:  VISTARIL     meclizine 25 MG tablet  Commonly known as:  ANTIVERT     methadone 10 MG/ML solution  Commonly known as:  DOLOPHINE  Replaced by:  methadone 5 MG tablet     methadone 5 MG/5ML solution  Commonly known as:  DOLOPHINE     mirtazapine 7.5 MG tablet  Commonly known as:  REMERON     OLANZapine 15 MG tablet  Commonly known as:  ZYPREXA     polyethylene glycol packet  Commonly known as:  MIRALAX / GLYCOLAX     senna-docusate 8.6-50 MG per tablet  Commonly known as:  Senokot-S     sennosides-docusate sodium 8.6-50 MG tablet  Commonly known as:  SENOKOT-S      TAKE these medications      Indication   benztropine 1 MG tablet  Commonly known as:  COGENTIN  Take 1 tablet (1 mg total) by mouth daily. For prevention of drug induced tremors   Indication:  Extrapyramidal Reaction caused by Medications     gabapentin 300 MG capsule  Commonly known as:  NEURONTIN  Take 2 capsules (600 mg total) by mouth 3 (three) times daily. For agitation/substance withdrawal syndrome   Indication:  Agitation, Pain, Substance withdrawal syndrome     hydrOXYzine 25 MG tablet  Commonly known as:  ATARAX/VISTARIL   Take 1 tablet (25 mg total) by mouth every 6 (six) hours as needed for anxiety.   Indication:  Anxiety Neurosis, Tension     lamoTRIgine 25 MG tablet  Commonly known as:  LAMICTAL  Take 1 tablet (25 mg total) by mouth daily. For mood stabilization   Indication:  Depression, Mood stabilization     metFORMIN 500 MG tablet  Commonly known as:  GLUCOPHAGE  Take 1 tablet (500 mg total) by mouth 2 (two) times daily with a meal. For diabetes management   Indication:  Type 2 Diabetes     methadone 5 MG tablet  Commonly known as:  DOLOPHINE  Take 9 tablets (45 mg total) by mouth daily. For opioid addiction   Indication:  Opioid Dependence     nicotine 21 mg/24hr patch  Commonly known as:  NICODERM CQ - dosed in mg/24 hours  Place 1 patch (21 mg total) onto the skin daily at 6 (six) AM. For nicotine addiction   Indication:  Nicotine Addiction     QUEtiapine 200 MG tablet  Commonly known as:  SEROQUEL  Take 1 tablet (200 mg total) by mouth at bedtime. For mood control   Indication:  Mood control     sulfamethoxazole-trimethoprim 400-80 MG per tablet  Commonly known as:  BACTRIM,SEPTRA  Take 1 tablet by mouth every 12 (twelve) hours. For infection   Indication:  Infection     traZODone 50 MG tablet  Commonly known as:  DESYREL  Take 1 tablet (50 mg total) by mouth at bedtime as needed for sleep.   Indication:  Trouble Sleeping       Follow-up Information    [facility] with [facility] .   Why:  Go to [facility] for [facility] After Discharge Followup on Thursday 05/11/14 between 8 and 10 AM  Contact information:   [address] [place], [place] [address] [facility]: [phone] FAX: Not Available      Follow up with [facility]. Go today.   Why:  Follow up with [facility] day following discharge in order to maintain methadone protocol   Contact information:   [address] [place], [place]  [address] [facility] [phone] FAX [phone]     Follow-up  recommendations: Activity:  As tolerated Diet: As recommended by your primary care doctor. Keep all scheduled follow-up appointments as recommended.   Comments: Take all your medications as prescribed by your mental healthcare provider. Report any adverse effects and or reactions from your medicines to your outpatient provider promptly. Patient is instructed and cautioned to not engage in alcohol and or illegal drug use while on prescription medicines. In the event of worsening symptoms, patient is instructed to call the crisis hotline, 911 and or go to the nearest ED for appropriate evaluation and treatment of symptoms. Follow-up with your primary care provider for your other medical issues, concerns and or health care needs.   Total Discharge Time: Greater than 30 minutes  Signed: Sanjuana Kava, PMHNP, FNP-BC 05/05/2014, 10:19 AM

## 2014-05-05 NOTE — Progress Notes (Deleted)
  [facility] Adult Case Management Discharge Plan :  Will you be returning to the same living situation after discharge:  Yes,  Home  At discharge, do you have transportation home?: Yes,  Significant other  Do you have the ability to pay for your medications: Yes,  Mental health   Release of information consent forms completed and in the chart;  Patient's signature needed at discharge.  Patient to Follow up at: Follow-up Information    [facility] .   Why:  [facility] After [facility]charge Followup on Thursday 05/11/14 between 8 and 10 AM   Contact information:   [address] [place], [place] [address] [facility]: [phone] FAX: Not Available      [facility] with [facility]. Go today.   Why:  Follow up with [facility] day following discharge in order to maintain methadone protocol   Contact information:   [address] [place], [place]  [id number] [facility] [phone] Valinda HoarFAX [phone]      Patient denies SI/HI: Yes,  Yes     Safety Planning and Suicide Prevention discussed: Yes,  [name] at [phone]  Have you used any form of tobacco in the last 30 days? (Cigarettes, Smokeless Tobacco, Cigars, and/or Pipes): No  Has patient been referred to the Quitline?: N/A patient is not a smoker  [name],[name] 05/05/2014, 9:47 AM

## 2014-05-05 NOTE — [facility] Suicide Risk Assessment (Signed)
[facility] Dis[facility]   Demographic Factors:  Caucasian and Unemployed  Total Time spent with patient: 30 minutes  Musculoskeletal: Strength & Muscle Tone: within normal limits Gait & Station: normal Patient leans: N/A  Psychiatric Specialty Exam: Physical Exam  Review of Systems  Psychiatric/Behavioral: Positive for substance abuse (stable). Negative for depression and suicidal ideas.    Blood pressure 100/50, pulse 72, temperature 98.4 F (36.9 C), temperature source Oral, resp. rate 18, height 5\' 5"  (1.651 m), weight 77.565 kg (171 lb), last menstrual period 03/15/2014, SpO2 99 %.Body mass index is 28.46 kg/(m^2).  General Appearance: Casual  Eye Contact::  Fair  Speech:  Clear and Coherent409  Volume:  Normal  Mood:  Euthymic  Affect:  Congruent  Thought Process:  Coherent  Orientation:  Full (Time, Place, and Person)  Thought Content:  WDL  Suicidal Thoughts:  No  Homicidal Thoughts:  No  Memory:  Immediate;   Fair Recent;   Fair Remote;   Fair  Judgement:  Fair  Insight:  Fair  Psychomotor Activity:  Normal  Concentration:  Fair  Recall:  FiservFair  Fund of Knowledge:Fair  Language: Fair  Akathisia:  No  Handed:  Right  AIMS (if indicated):     Assets:  Physical Health Social Support  Sleep:  Number of Hours: 6.75  Cognition: WNL  ADL's:  Intact   Have you used any form of tobacco in the last 30 days? (Cigarettes, Smokeless Tobacco, Cigars, and/or Pipes): No  Has this patient used any form of tobacco in the last 30 days? (Cigarettes, Smokeless Tobacco, Cigars, and/or Pipes) No  Mental Status Per Nursing Assessment::   On Admission:  Suicidal ideation indicated by patient  Current Mental Status by Physician: patient denies SI/HI/AH/VH  Loss Factors: DEATH OF HER DAUGHTER  Historical Factors: Impulsivity  Risk Reduction Factors:   Positive social support and Positive therapeutic relationship  Continued Clinical Symptoms:   Alcohol/Substance Abuse/Dependencies Previous Psychiatric Diagnoses and Treatments  Cognitive Features That Contribute To Risk:  Polarized thinking    Suicide Risk:  Minimal: No identifiable suicidal ideation.  Patients presenting with no risk factors but with morbid ruminations; may be classified as minimal risk based on the severity of the depressive symptoms  Principal Problem: Bipolar affective disorder, depressed, severe, with psychotic behavior Discharge Diagnoses:  Diagnosis:  Primary Psychiatric Diagnosis: Bipolar disorder, Type I ,most recent episode depressed with psychotic features (improved)   Secondary Psychiatric Diagnosis: Bereavement Sedative hypnotic and anxiolytic use disorder,severe (klonopin,ativan ,xanax) Opioid use disorder, severe (on maintenance therapy) PTSD   Non Psychiatric Diagnosis: Hepatitis c See pmh Patient Active Problem List   Diagnosis Date Noted  . 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 Opioid use disorder, moderate, in early remission, on maintenance therapy [F11.90] 05/02/2014  . Benzodiazepine withdrawal with perceptual disturbance [F13.232] 05/02/2014  . Bereavement [Z63.4] 05/02/2014  . PTSD (post-traumatic stress disorder) [F43.10] 05/02/2014  . Bipolar affective disorder, depressed, severe, with psychotic behavior [F31.5] 05/01/2014  . BV (bacterial vaginosis) [N76.0, A49.9]   . Hepatitis C [B19.20] 08/27/2013  . UTI (urinary tract infection) [N39.0] 08/27/2013  . GERD (gastroesophageal reflux disease) [K21.9] 08/26/2013  . Transaminitis [R74.0] 08/25/2013  . Hidradenitis [L73.2] 12/07/2012    Follow-up Information    [facility] with [facility] .   Why:  [facility] After Discharge Followup on Thursday 05/11/14 between 8 and 10 AM   Contact information:   [address] [place], [place] [address]  PH: [phone] FAX: Not Available      Follow up with [facility] Go today.    Why:  Follow up with [facility] day following discharge in order to maintain methadone protocol   Contact information:   [address] [place], [place]  [address] [facility] [phone] FAX [phone]      Plan Of Care/Follow-up recommendations:  Activity:  NO RESTRICTIONS Diet:  DIET Tests:  AS NEEDED Other:  FOLLOW UP WITH AFTERCARE AS SCHEDULED  Is patient on multiple antipsychotic therapies at discharge:  No   Has Patient had three or more failed trials of antipsychotic monotherapy by history:  No  Recommended Plan for Multiple Antipsychotic Therapies: NA    Dilara Navarrete md 05/05/2014, 9:58 AM

## 2014-05-05 NOTE — Progress Notes (Signed)
D:Pt d/c home as per order.   A: D/C instructions reviewed with pt and medication education done on ordered meds and prescriptions.  All items in locker 22 given to pt at time of exit. Vitals done, WNL and recorded. No physical distress noted at time of d/c. Pt encouraged to go to her follow up appointments and maintained compliance with her medications and treatment therapy.   R: Pt alert, oriented X 4. Cooperative and was visibly excited about going home. Denied AVH, pain, SI / HI when assessed. Verbalized understanding r/t to to d/c instructions and medications. Signed belonging sheet in agreement with items received from locker 22. Gait steady, affect bright as pt bid goodbye to peers. Safety maintained on Q 15 minutes checks till time of d/c.

## 2014-05-05 NOTE — Progress Notes (Signed)
  [facility] Discharge Plan :  Will you be returning to the same living situation after discharge:  Yes,  with significant other At discharge, do you have transportation home?: Yes,  with significant other  Do you have the ability to pay for your medications: Yes,  through [facility] [place]  Release of information consent forms completed and in the chart;  Patient's signature needed at discharge.  Patient to Follow up at: Follow-up Information    [facility] .   Why:  [facility] Followup on Thursday 05/11/14 between 8 and 10 AM   Contact information:   [address] [place], [place] [address] [facility]: [phone] FAX: Not Available      [facility].    Why:  [facility] day following discharge in order to maintain methadone protocol   Contact information:   [address] [place], [place]  [address] [facility] [phone] Valinda HoarFAX [phone]      Patient denies SI/HI: Yes,  denies both    Safety Planning and Suicide Prevention discussed: Yes,  with significant other  Have you used any form of tobacco in the last 30 days? (Cigarettes, Smokeless Tobacco, Cigars, and/or Pipes): Yes  Has patient been referred to the Quitline?: Yes, faxed on 05/02/14  Clide DalesHarrill, Catherine Campbell 05/05/2014, 9:34 AM

## 2014-05-10 NOTE — Progress Notes (Signed)
Patient Discharge Instructions:  After Visit Summary (AVS):   Faxed to:  05/10/14 Discharge Summary Note:   Faxed to:  05/10/14 Psychiatric Admission Assessment Note:   Faxed to:  05/10/14 Suicide Risk Assessment - Discharge Assessment:   Faxed to:  05/10/14 Faxed/Sent to the Next Level Care provider:  05/10/14 [facility] @ [phone] [facility] @ [phone]  Jerelene ReddenSheena E Sullivan, 05/10/2014, 2:23 PM

## 2014-10-25 ENCOUNTER — Inpatient Hospital Stay: Payer: Self-pay | Admitting: Family Medicine

## 2015-02-14 ENCOUNTER — Emergency Department (HOSPITAL_COMMUNITY)
Admission: EM | Admit: 2015-02-14 | Discharge: 2015-02-15 | Disposition: A | Discharge status: Home / Self Care | Payer: 59 | Attending: Emergency Medicine | Admitting: Emergency Medicine

## 2015-02-14 ENCOUNTER — Encounter (HOSPITAL_COMMUNITY): Payer: Self-pay | Admitting: *Deleted

## 2015-02-14 DIAGNOSIS — Z7984 Long term (current) use of oral hypoglycemic drugs: Secondary | ICD-10-CM | POA: Insufficient documentation

## 2015-02-14 DIAGNOSIS — L03114 Cellulitis of left upper limb: Secondary | ICD-10-CM

## 2015-02-14 DIAGNOSIS — F329 Major depressive disorder, single episode, unspecified: Secondary | ICD-10-CM | POA: Insufficient documentation

## 2015-02-14 DIAGNOSIS — Z79899 Other long term (current) drug therapy: Secondary | ICD-10-CM | POA: Insufficient documentation

## 2015-02-14 DIAGNOSIS — Z8719 Personal history of other diseases of the digestive system: Secondary | ICD-10-CM | POA: Insufficient documentation

## 2015-02-14 DIAGNOSIS — M1990 Unspecified osteoarthritis, unspecified site: Secondary | ICD-10-CM | POA: Insufficient documentation

## 2015-02-14 DIAGNOSIS — F419 Anxiety disorder, unspecified: Secondary | ICD-10-CM | POA: Insufficient documentation

## 2015-02-14 DIAGNOSIS — F17200 Nicotine dependence, unspecified, uncomplicated: Secondary | ICD-10-CM | POA: Insufficient documentation

## 2015-02-14 DIAGNOSIS — G8929 Other chronic pain: Secondary | ICD-10-CM | POA: Insufficient documentation

## 2015-02-14 DIAGNOSIS — Z792 Long term (current) use of antibiotics: Secondary | ICD-10-CM | POA: Insufficient documentation

## 2015-02-14 DIAGNOSIS — L02414 Cutaneous abscess of left upper limb: Secondary | ICD-10-CM

## 2015-02-14 LAB — CBC WITH DIFFERENTIAL/PLATELET
BASOS PCT: 0 %
Basophils Absolute: 0 10*3/uL (ref 0.0–0.1)
EOS ABS: 0.2 10*3/uL (ref 0.0–0.7)
Eosinophils Relative: 3 %
HEMATOCRIT: 36 % (ref 36.0–46.0)
Hemoglobin: 12.1 g/dL (ref 12.0–15.0)
LYMPHS ABS: 1.5 10*3/uL (ref 0.7–4.0)
Lymphocytes Relative: 26 %
MCH: 28.5 pg (ref 26.0–34.0)
MCHC: 33.6 g/dL (ref 30.0–36.0)
MCV: 84.7 fL (ref 78.0–100.0)
MONO ABS: 0.5 10*3/uL (ref 0.1–1.0)
MONOS PCT: 8 %
NEUTROS ABS: 3.6 10*3/uL (ref 1.7–7.7)
Neutrophils Relative %: 63 %
Platelets: 259 10*3/uL (ref 150–400)
RBC: 4.25 MIL/uL (ref 3.87–5.11)
RDW: 13.8 % (ref 11.5–15.5)
WBC: 5.7 10*3/uL (ref 4.0–10.5)

## 2015-02-14 LAB — I-STAT CHEM 8, ED
BUN: 9 mg/dL (ref 6–20)
CREATININE: 0.5 mg/dL (ref 0.44–1.00)
Calcium, Ion: 1.17 mmol/L (ref 1.12–1.23)
Chloride: 101 mmol/L (ref 101–111)
GLUCOSE: 91 mg/dL (ref 65–99)
HEMATOCRIT: 37 % (ref 36.0–46.0)
Hemoglobin: 12.6 g/dL (ref 12.0–15.0)
POTASSIUM: 3.4 mmol/L — AB (ref 3.5–5.1)
Sodium: 140 mmol/L (ref 135–145)
TCO2: 27 mmol/L (ref 0–100)

## 2015-02-14 MED ORDER — LORAZEPAM 2 MG/ML IJ SOLN
1.0000 mg | Freq: Once | INTRAMUSCULAR | Status: AC
  Administered 2015-02-14: 1 mg via INTRAVENOUS
  Filled 2015-02-14 (×2): qty 1

## 2015-02-14 MED ORDER — LIDOCAINE-EPINEPHRINE (PF) 2 %-1:200000 IJ SOLN: 10.0000 mL | Freq: Once | INTRAMUSCULAR | Status: DC

## 2015-02-14 MED ORDER — MORPHINE SULFATE (PF) 4 MG/ML IV SOLN
4.0000 mg | Freq: Once | INTRAVENOUS | Status: AC
  Administered 2015-02-14: 4 mg via INTRAVENOUS
  Filled 2015-02-14 (×2): qty 1

## 2015-02-14 MED ORDER — LIDOCAINE-EPINEPHRINE 2 %-1:100000 IJ SOLN
INTRAMUSCULAR | Status: AC
  Administered 2015-02-15: 01:00:00
  Filled 2015-02-14: qty 1

## 2015-02-14 NOTE — ED Notes (Signed)
IV attempt unsuccessful; pt reports prior IV access had to be established in foot, leg, neck, or breast tissue. IV team consult requested. Primary nurse has been notified.

## 2015-02-14 NOTE — ED Notes (Signed)
Pt reports having an itch when taking morphine.  PA made aware of pt's reaction to morphine.

## 2015-02-14 NOTE — ED Notes (Addendum)
Pt has large abscess to her left forearm starting 7 days ago. Pt states the abscess started when she missed a vein while using IV heroin and coke mixed together. Pt states she used a clean needle. Pt states she has not used heroin for the past 7 days. Pt states she is feeling dizzy, weakness, anxiety, nausea, diarrhea starting today.

## 2015-02-15 MED ORDER — CEPHALEXIN 500 MG PO CAPS: 500.0000 mg | ORAL_CAPSULE | Freq: Four times a day (QID) | ORAL | Status: AC

## 2015-02-15 MED ORDER — CLINDAMYCIN PHOSPHATE 600 MG/50ML IV SOLN
600.0000 mg | Freq: Once | INTRAVENOUS | Status: AC
  Administered 2015-02-15: 600 mg via INTRAVENOUS
  Filled 2015-02-15: qty 50

## 2015-02-15 MED ORDER — SULFAMETHOXAZOLE-TRIMETHOPRIM 800-160 MG PO TABS: 1.0000 | ORAL_TABLET | Freq: Two times a day (BID) | ORAL | Status: AC

## 2015-02-15 MED ORDER — HYDROMORPHONE HCL 1 MG/ML IJ SOLN
1.0000 mg | Freq: Once | INTRAMUSCULAR | Status: AC
  Administered 2015-02-15: 1 mg via INTRAVENOUS
  Filled 2015-02-15: qty 1

## 2015-02-15 NOTE — ED Provider Notes (Signed)
CSN: [id number]     Arrival date & time 02/14/15  2015 History   First MD Initiated Contact with Patient 02/14/15 2151     Chief Complaint  Patient presents with  . Abscess     (Consider location/radiation/quality/duration/timing/severity/associated sxs/prior Treatment) HPI [name] is a 29 y.o. female presents to emergency department complaining of swelling to the left forearm. Patient is IV drug user. She states 7 days ago she injected heroin" and states "I missed the vein." Since then she reports swelling to the left forearm. Patient also states has swelling to the right forearm from IV drug use but denies pain to the forearm. Patient reports swelling started a few days ago worsened. She reports some tingling to her fingers. She denies fever or chills. She reports generalized malaise. She has been trying to poke her arm with a refill but states nothing comes out. History of similar abscesses in the past. States she has not used in 7 days. Just started methadone treatment  Past Medical History  Diagnosis Date  . Drug abuse   . GERD (gastroesophageal reflux disease)   . Depression   . Arthritis   . Seizures (HCC)   . Chronic pain   . Methadone maintenance therapy patient (HCC)   . Anxiety    Past Surgical History  Procedure Laterality Date  . Appendectomy    . Cesarean section    . Mandible fracture surgery     Family History  Problem Relation Age of Onset  . Mental illness Mother   . Mental illness Brother   . Diabetes Maternal Grandmother   . Mental illness Maternal Grandmother   . Hypertension Maternal Grandfather   . Diabetes Paternal Grandmother   . Heart disease Paternal Grandmother    Social History  Substance Use Topics  . Smoking status: Current Some Day Smoker -- 1.00 packs/day for 5 years    Last Attempt to Quit: 01/13/2011  . Smokeless tobacco: Never Used  . Alcohol Use: No     Comment: Quit three months ago   OB History    Gravida Para Term Preterm  AB TAB SAB Ectopic Multiple Living   3 2   1 1          Review of Systems  Constitutional: Negative for fever and chills.  Skin: Positive for color change and wound.      Allergies  Haldol; Narcan; and Suboxone  Home Medications   Prior to Admission medications   Medication Sig Start Date End Date Taking? Authorizing Provider  gabapentin (NEURONTIN) 400 MG capsule Take 1,200 mg by mouth 3 (three) times daily.   Yes Historical Provider, MD  hydrOXYzine (ATARAX/VISTARIL) 25 MG tablet Take 1 tablet (25 mg total) by mouth every 6 (six) hours as needed for anxiety. 05/05/14  Yes Sanjuana KavaAgnes I Nwoko, NP  methadone (DOLOPHINE) 10 MG/5ML solution Take 40 mg by mouth daily.   Yes Historical Provider, MD  QUEtiapine (SEROQUEL) 200 MG tablet Take 1 tablet (200 mg total) by mouth at bedtime. For mood control Patient taking differently: Take 200 mg by mouth daily as needed (sleep). For mood control 05/05/14  Yes Sanjuana KavaAgnes I Nwoko, NP  traZODone (DESYREL) 50 MG tablet Take 1 tablet (50 mg total) by mouth at bedtime as needed for sleep. 05/05/14  Yes Sanjuana KavaAgnes I Nwoko, NP  benztropine (COGENTIN) 1 MG tablet Take 1 tablet (1 mg total) by mouth daily. For prevention of drug induced tremors 05/05/14   Sanjuana KavaAgnes I Nwoko, NP  gabapentin (NEURONTIN) 300 MG capsule Take 2 capsules (600 mg total) by mouth 3 (three) times daily. For agitation/substance withdrawal syndrome 05/05/14   Sanjuana Kava, NP  lamoTRIgine (LAMICTAL) 25 MG tablet Take 1 tablet (25 mg total) by mouth daily. For mood stabilization 05/05/14   Sanjuana Kava, NP  metFORMIN (GLUCOPHAGE) 500 MG tablet Take 1 tablet (500 mg total) by mouth 2 (two) times daily with a meal. For diabetes management 05/05/14   Sanjuana Kava, NP  methadone (DOLOPHINE) 5 MG tablet Take 9 tablets (45 mg total) by mouth daily. For opioid addiction 05/05/14   Sanjuana Kava, NP  nicotine (NICODERM CQ - DOSED IN MG/24 HOURS) 21 mg/24hr patch Place 1 patch (21 mg total) onto the skin daily at 6 (six)  AM. For nicotine addiction 05/05/14   Sanjuana Kava, NP  sulfamethoxazole-trimethoprim (BACTRIM,SEPTRA) 400-80 MG per tablet Take 1 tablet by mouth every 12 (twelve) hours. For infection 05/05/14   Sanjuana Kava, NP   BP 117/76 mmHg  Pulse 89  Temp(Src) 97.8 F (36.6 C) (Oral)  Resp 22  Wt 77.565 kg  SpO2 100%  LMP 02/14/2015 Physical Exam  Constitutional: She appears well-developed and well-nourished. No distress.  HENT:  Head: Normocephalic.  Eyes: Conjunctivae are normal.  Neck: Neck supple.  Cardiovascular: Normal rate, regular rhythm and normal heart sounds.   Pulmonary/Chest: Effort normal and breath sounds normal. No respiratory distress. She has no wheezes. She has no rales.  Abdominal: Soft. Bowel sounds are normal. She exhibits no distension. There is no tenderness. There is no rebound.  Musculoskeletal: She exhibits no edema.  Multiple track marks all over arms and forearms. Large, 5 x 5 cm abscess to the left forearm with surrounding cellulitis. Normal wrist and hand. No swelling to the wrist or hand. Full range of motion of all fingers. Capillary refill less than 2 seconds distally. Sensation intact in all dermatomes of the hand. Compartments soft with this time.  Neurological: She is alert.  Skin: Skin is warm and dry.  Psychiatric: She has a normal mood and affect. Her behavior is normal.  Nursing note and vitals reviewed.   ED Course  Procedures (including critical care time) Labs Review Labs Reviewed  I-STAT CHEM 8, ED - Abnormal; Notable for the following:    Potassium 3.4 (*)    All other components within normal limits  CBC WITH DIFFERENTIAL/PLATELET    Imaging Review No results found. I have personally reviewed and evaluated these images and lab results as part of my medical decision-making.   EKG Interpretation None     INCISION AND DRAINAGE Performed by: Jaynie Crumble A Consent: Verbal consent obtained. Risks and benefits: risks, benefits  and alternatives were discussed Type: abscess  Body area: left forearm  Anesthesia: local infiltration  Incision was made with a scalpel.  Local anesthetic: lidocaine 2% w epinephrine  Anesthetic total: 4 ml  Complexity: complex Blunt dissection to break up loculations  Drainage: purulent  Drainage amount: large  Packing material: 1/2 in iodoform gauze  Patient tolerance: Patient tolerated the procedure well with no immediate complications.    MDM   Final diagnoses:  Abscess of left forearm  Cellulitis of left forearm   Pt with large abscess to left forearm from IV drug use. Afebrile, normal vital signs. No evidence of sepsis. Discussed with Dr. Lynelle Doctor who has seen patient as well. Does not think that this needs to be surgically opened at this time. Labs obtained and  unremarkable. Clindamycin 600 mg given in emergency department. Abscess incised and drained and packed. Will bring back in 2 days for recheck. Return earlier if worsening.  Filed Vitals:   02/14/15 2026 02/14/15 2243 02/15/15 0155  BP: 118/91 117/76 123/69  Pulse: 84 89 96  Temp: 97.8 F (36.6 C)    TempSrc: Oral    Resp: Weight: 77.565 kg    SpO2: 100% 100% 99%     Jaynie Crumble, PA-C 02/15/15 0206  Linwood Dibbles, MD 02/16/15 1058

## 2015-02-15 NOTE — Discharge Instructions (Signed)
Ibuprofen for pain. Bactrim and keflex for infection. Follow up in 2 days for recheck. Return if worsening rapidly.     Abscess An abscess (boil or furuncle) is an infected area on or under the skin. This area is filled with yellowish-white fluid (pus) and other material (debris). HOME CARE   Only take medicines as told by your doctor.  If you were given antibiotic medicine, take it as directed. Finish the medicine even if you start to feel better.  If gauze is used, follow your doctor's directions for changing the gauze.  To avoid spreading the infection:  Keep your abscess covered with a bandage.  Wash your hands well.  Do not share personal care items, towels, or whirlpools with others.  Avoid skin contact with others.  Keep your skin and clothes clean around the abscess.  Keep all doctor visits as told. GET HELP RIGHT AWAY IF:   You have more pain, puffiness (swelling), or redness in the wound site.  You have more fluid or blood coming from the wound site.  You have muscle aches, chills, or you feel sick.  You have a fever. MAKE SURE YOU:   Understand these instructions.  Will watch your condition.  Will get help right away if you are not doing well or get worse.   This information is not intended to replace advice given to you by your health care provider. Make sure you discuss any questions you have with your health care provider.   Document Released: 08/13/2007 Document Revised: 08/26/2011 Document Reviewed: 05/10/2011 Elsevier Interactive Patient Education [employer]2016 [employer]

## 2015-06-22 IMAGING — US US ABDOMEN LIMITED
1 series · 14 of 25 positions shown · non-contrast
Comparison: None.

CLINICAL DATA: Elevated liver function tests

EXAM:
US ABDOMEN LIMITED - RIGHT UPPER QUADRANT

[Series 1: us abdomen limited · 0.24mm/px · 14 of 72 slices shown]
[im 1/72]
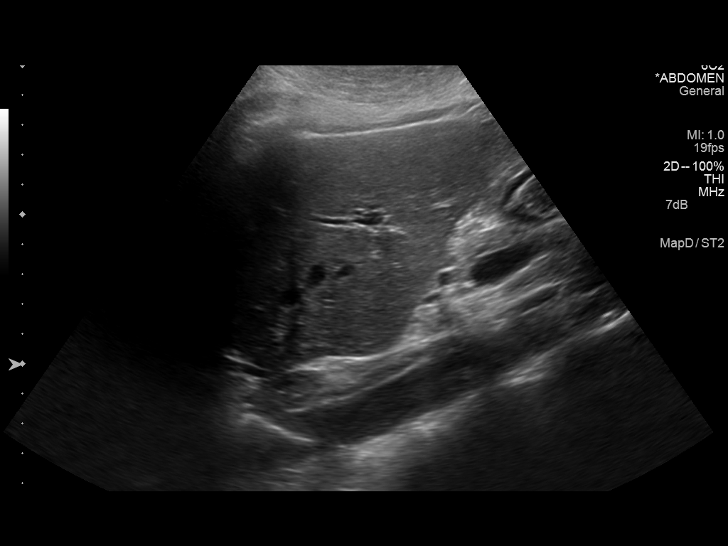
[im 6/72]
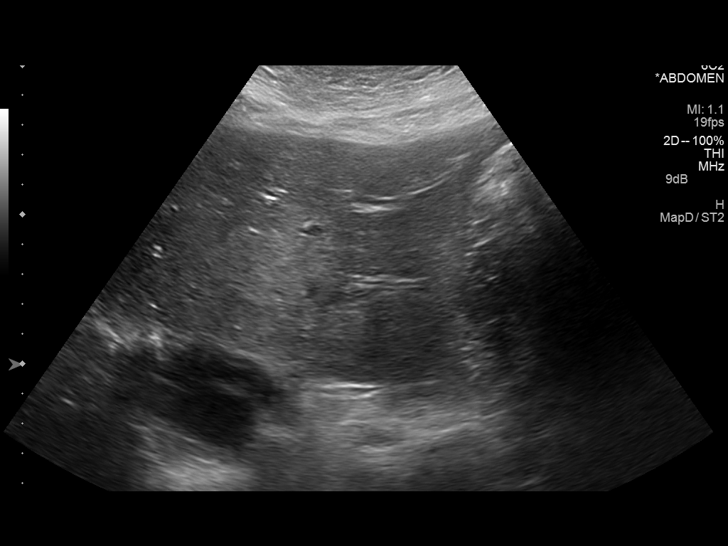
[im 12/72]
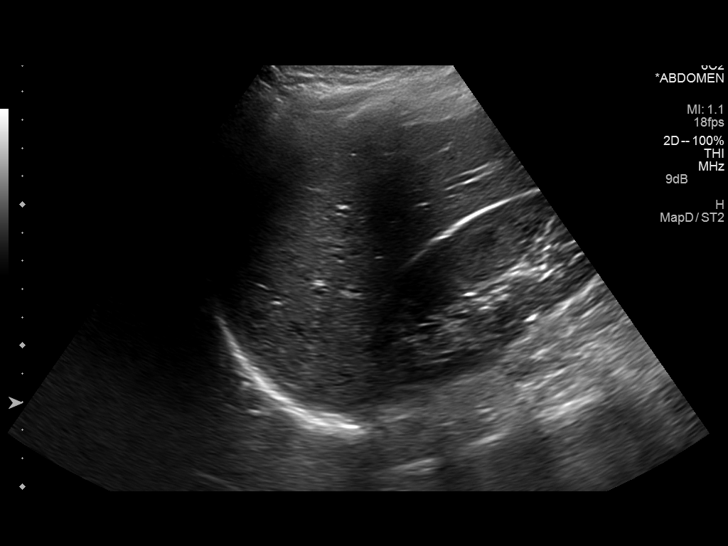
[im 18/72]
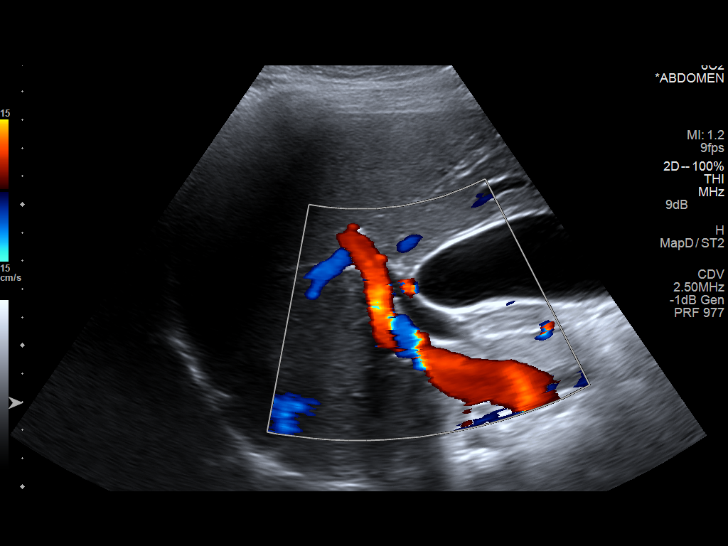
[im 24/72]
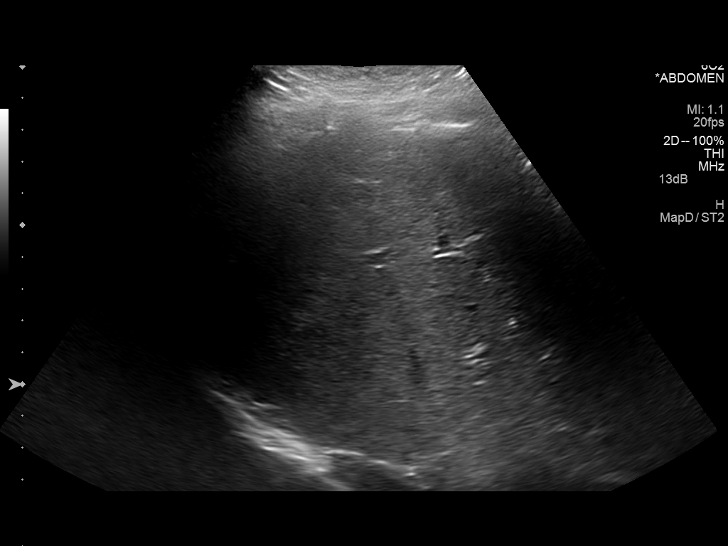
[im 27/72]
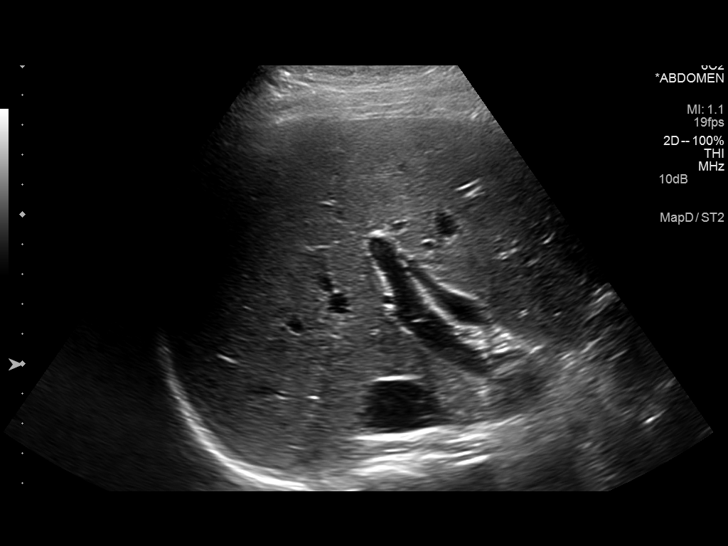
[im 33/72]
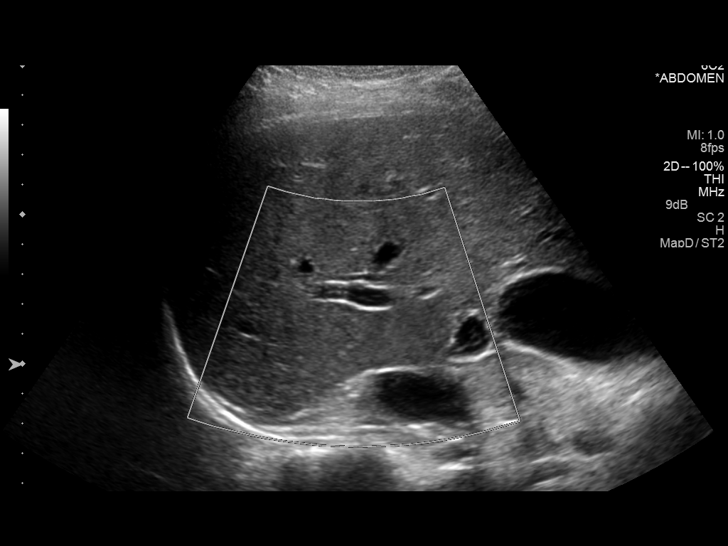
[im 39/72]
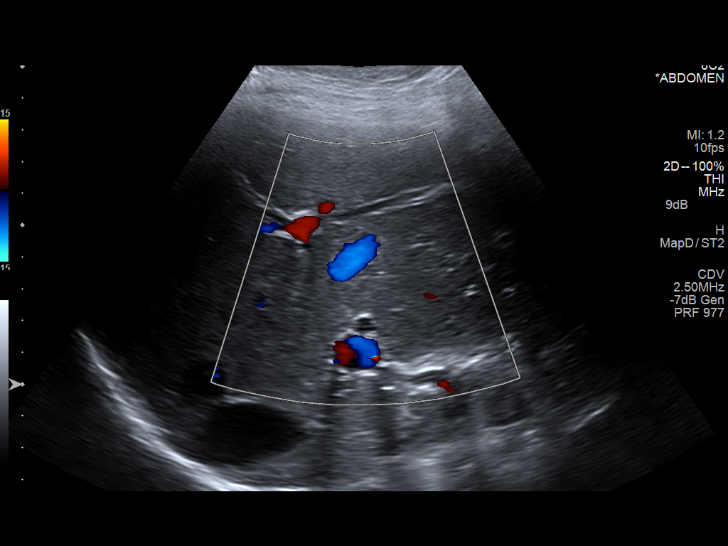
[im 45/72]
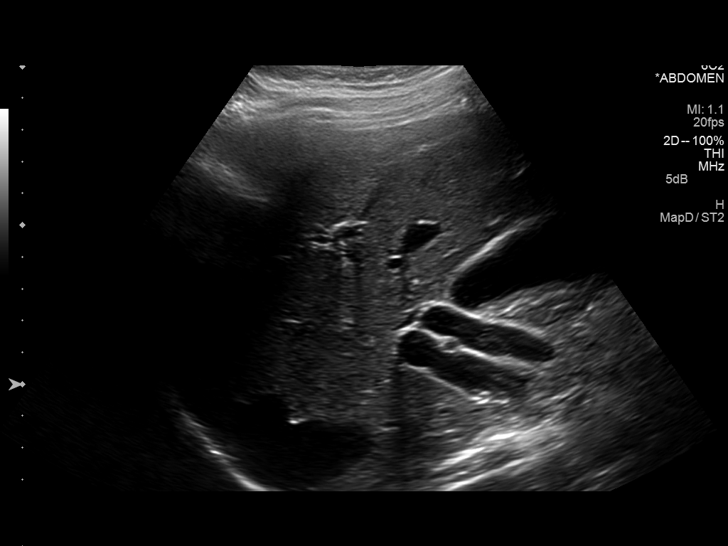
[im 48/72]
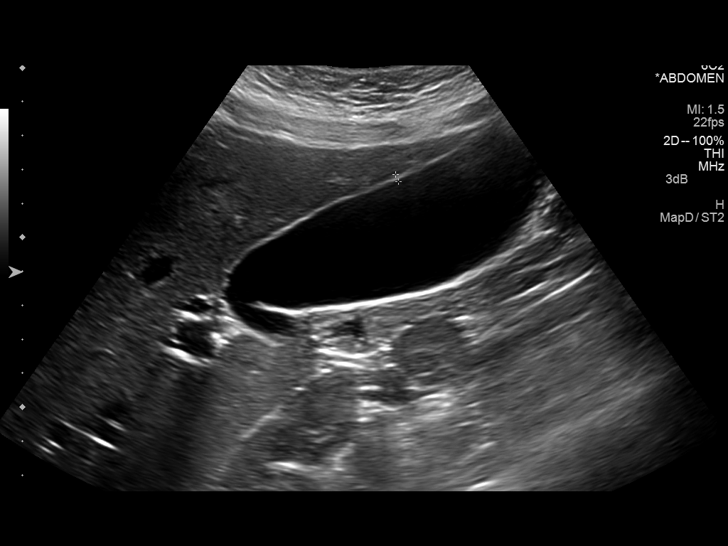
[im 54/72]
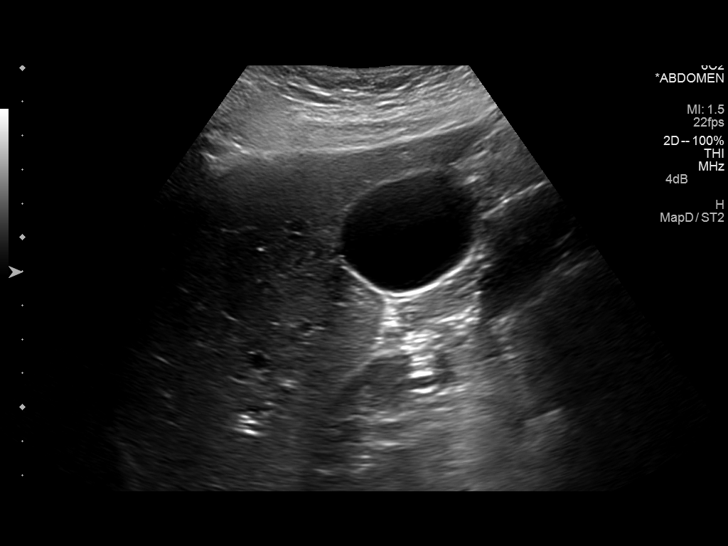
[im 60/72]
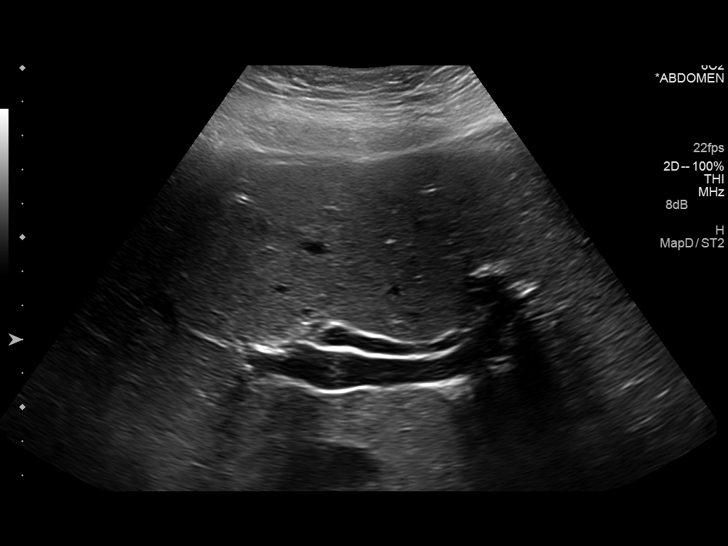
[im 66/72]
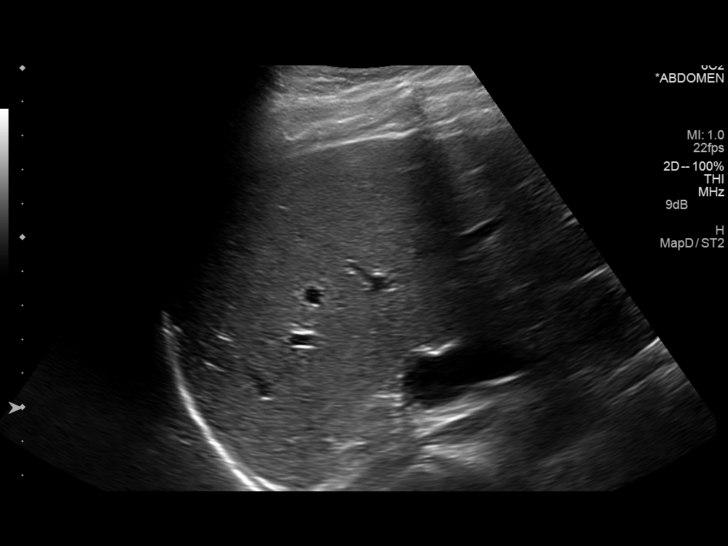
[im 72/72]
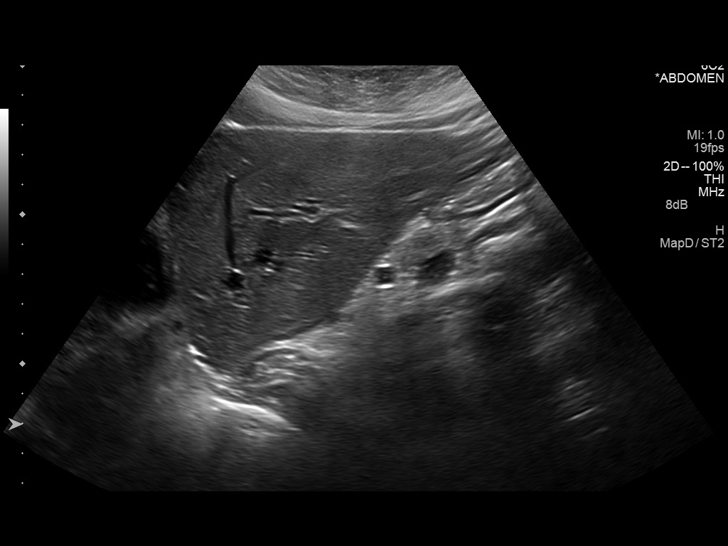

[14 of 25 positions shown; findings below may reference images not displayed]

FINDINGS: Gallbladder:

No gallstones or wall thickening visualized. No sonographic Murphy
sign noted.

Common bile duct:

Diameter: 8.9 mm. No duct stone is seen. The most distal aspect the
duct was not visualized.

Liver:

Mild intrahepatic bile duct dilation. Normal parenchymal
echogenicity. No mass or focal lesion. Normal liver size. Normal
hepatopetal flow in the portal vein.
IMPRESSION: 1. Intra and extrahepatic bile duct dilation. This is abnormal in
this patient's age group, but the etiology is unclear. If there are
symptoms of biliary obstruction, followup ERCP or MRCP would be
recommended.
2. No other abnormalities.

## 2015-06-22 IMAGING — CT CT HEAD W/O CM
1 series · 16 of 30 positions shown, 20 images · non-contrast
Comparison: September 11, 2012

CLINICAL DATA: Seizure and confusion; recent trauma

EXAM:
CT HEAD WITHOUT CONTRAST
TECHNIQUE: Contiguous axial images were obtained from the base of the skull
through the vertex without intravenous contrast.

[Series 2: headseq 4.8 h45s · axial · 0.43mm/px · z∈[-178,-26]mm · 16 of 36 slices shown, 20 images]
[im 2/36  brain]
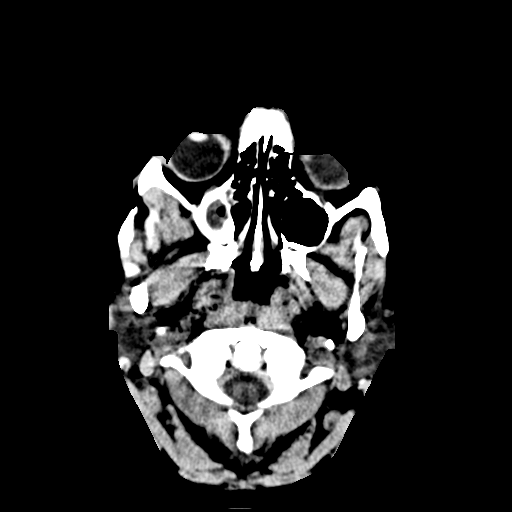
[im 2/36  bone]
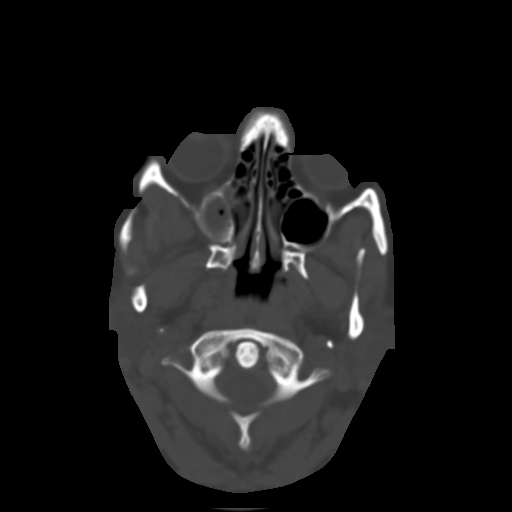
[im 4/36  brain]
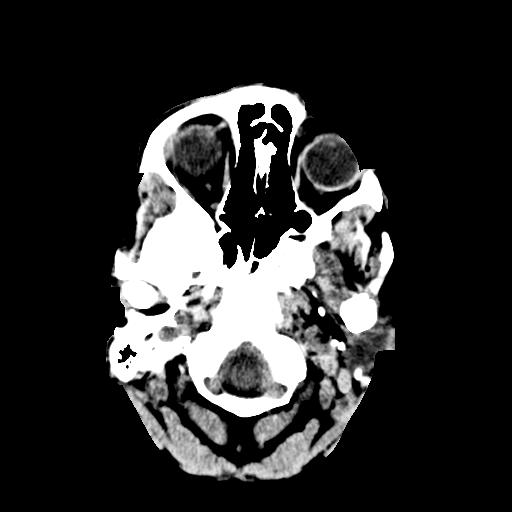
[im 7/36  brain]
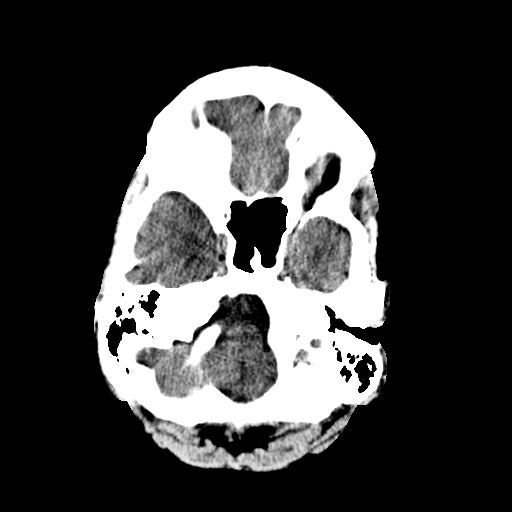
[im 9/36  brain]
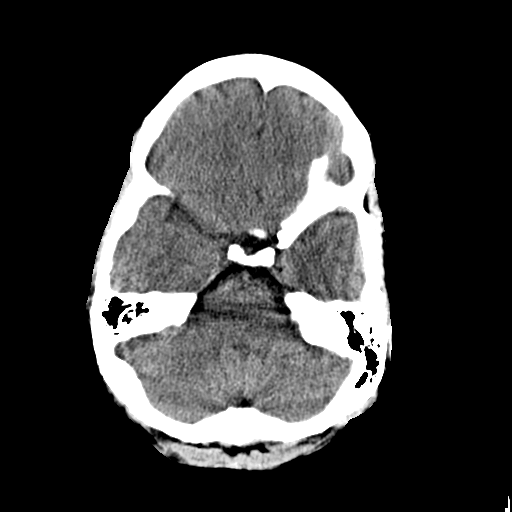
[im 10/36  brain]
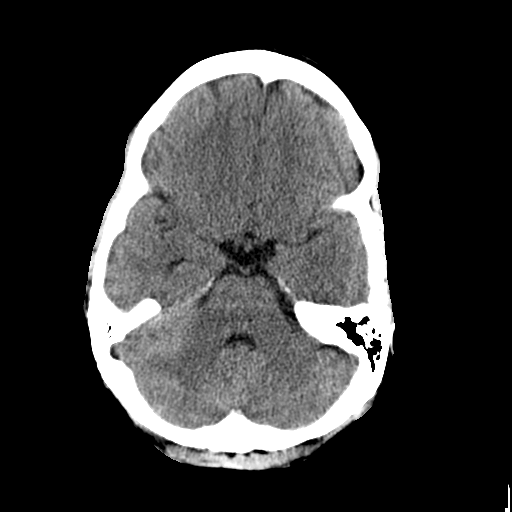
[im 10/36  bone]
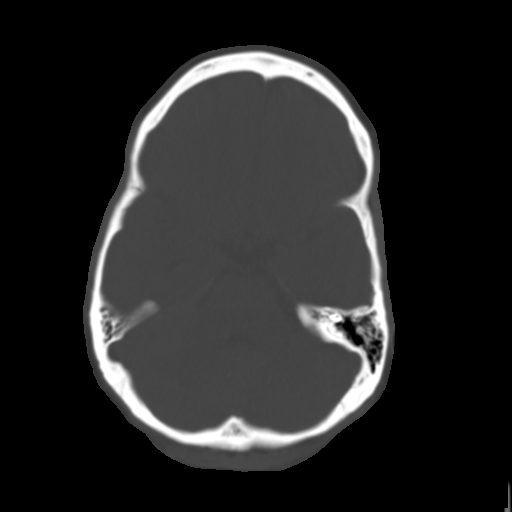
[im 13/36  brain]
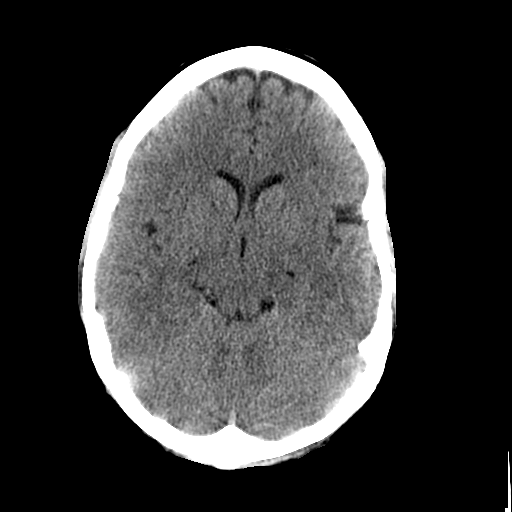
[im 15/36  brain]
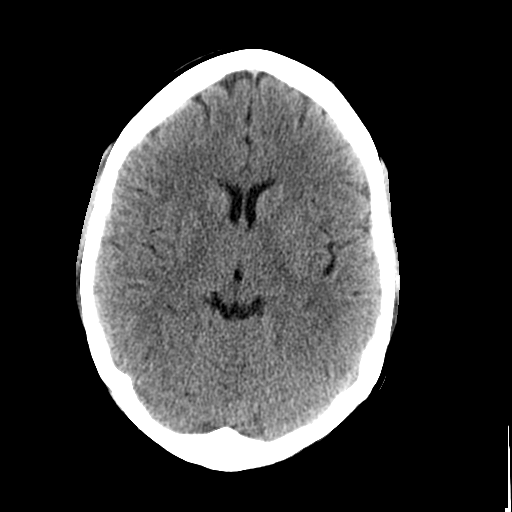
[im 17/36  brain]
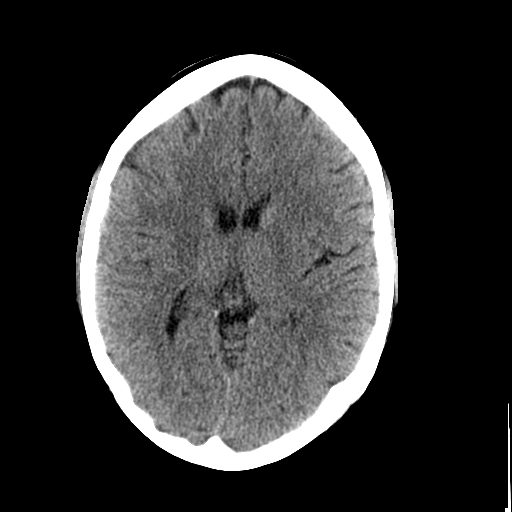
[im 19/36  brain]
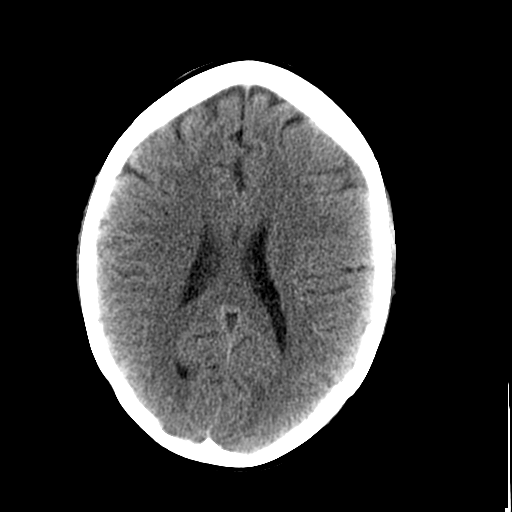
[im 19/36  bone]
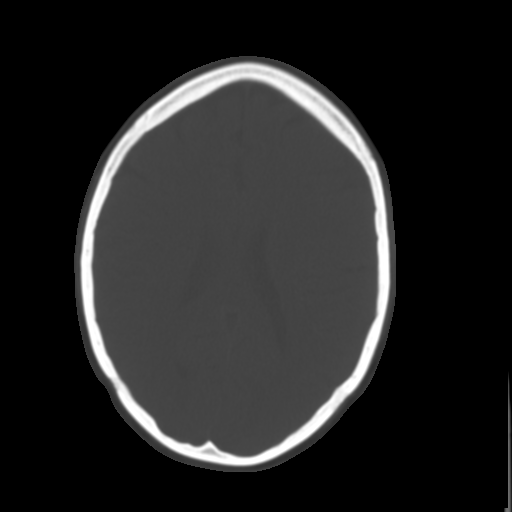
[im 21/36  brain]
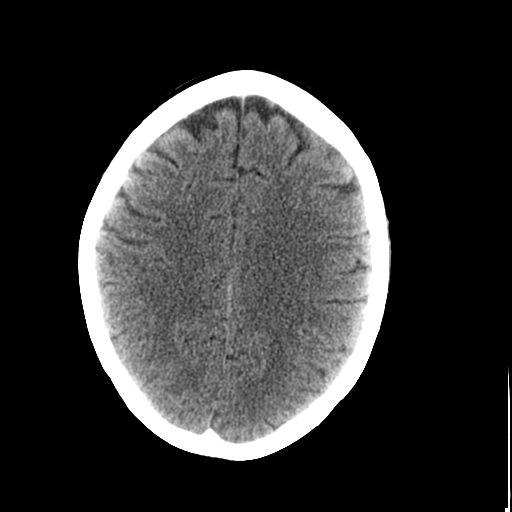
[im 23/36  brain]
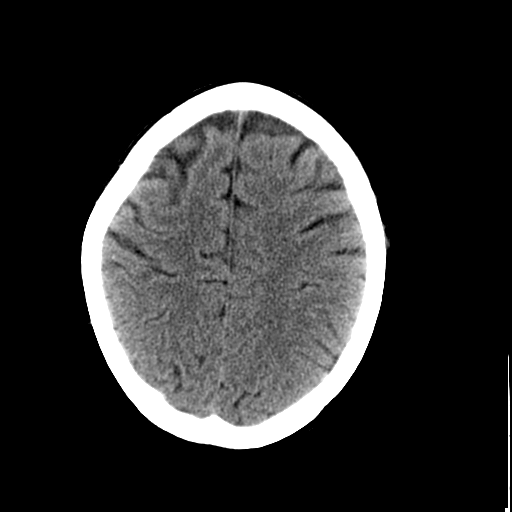
[im 26/36  brain]
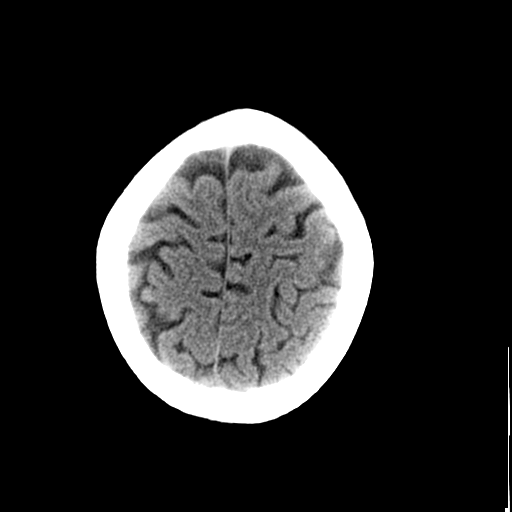
[im 27/36  brain]
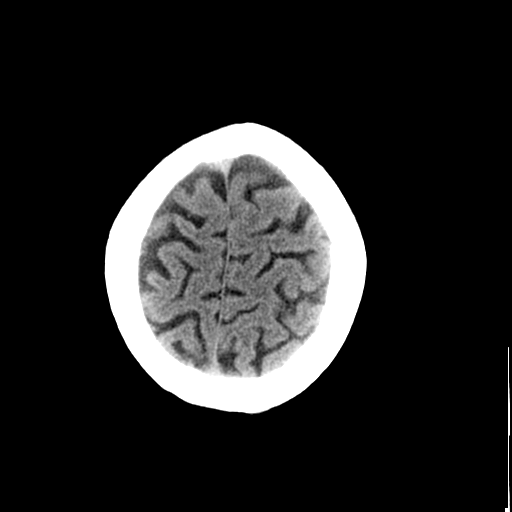
[im 27/36  bone]
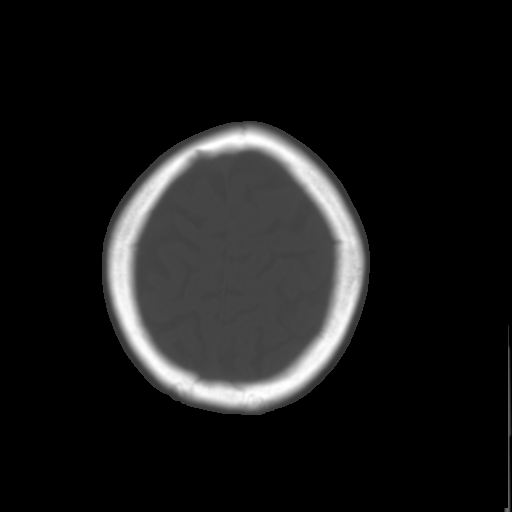
[im 29/36  brain]
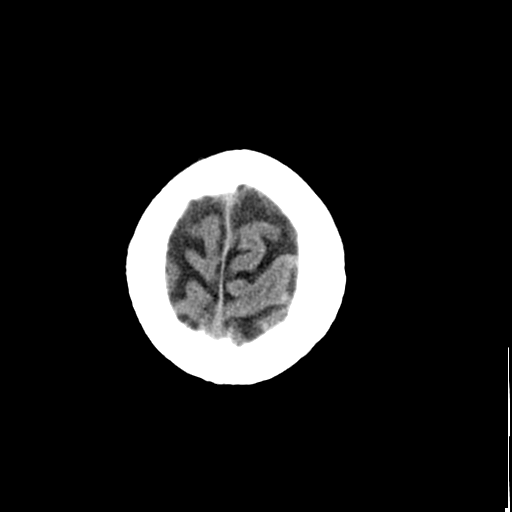
[im 32/36  brain]
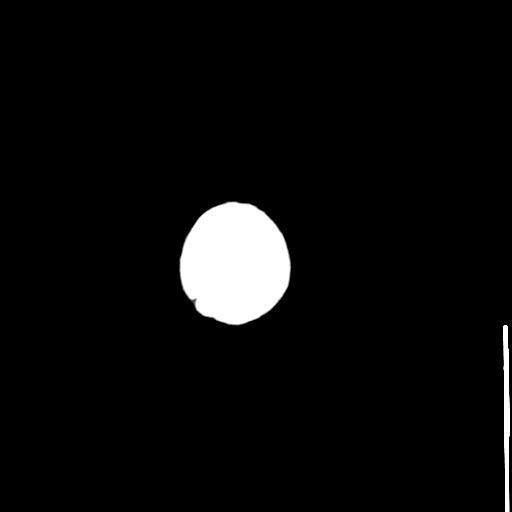
[im 34/36  brain]
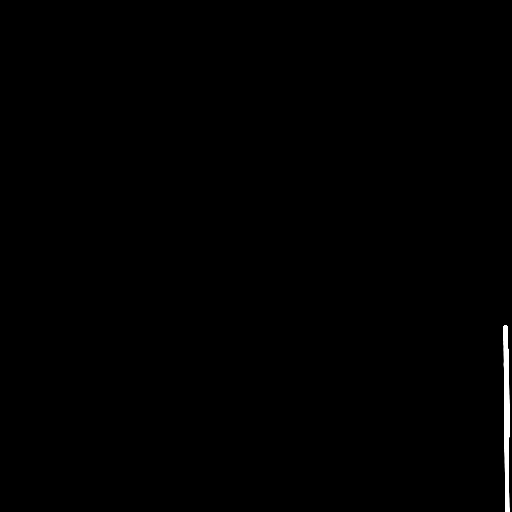

[16 of 30 positions shown; findings below may reference images not displayed]

FINDINGS: The ventricles are normal in size and configuration. There is no
mass, hemorrhage, extra-axial fluid collection, or midline shift.
Gray-white compartments appear normal. There is no demonstrable
acute infarct.

Bony calvarium appears intact. The mastoid air cells are clear.
There is deformity in the region of the left mandibular condyle
suggesting an old healed fracture, a stable finding. There is
opacification of the visualized right maxillary antrum.
IMPRESSION: No intracranial mass, hemorrhage, or acute infarct. Old trauma left
mandibular condyle. Right maxillary sinus disease.

## 2016-01-19 IMAGING — US US TRANSVAGINAL NON-OB
1 series · 13 of 25 positions shown · non-contrast
Comparison: CT 05/05/2006

CLINICAL DATA: Vaginal discharge, pelvic pain. Concern for pelvic
inflammatory disease or tubo-ovarian abscess.

EXAM:
TRANSABDOMINAL AND TRANSVAGINAL ULTRASOUND OF PELVIS
DOPPLER ULTRASOUND OF OVARIES
TECHNIQUE: Both transabdominal and transvaginal ultrasound examinations of the
pelvis were performed. Transabdominal technique was performed for
global imaging of the pelvis including uterus, ovaries, adnexal
regions, and pelvic cul-de-sac.
It was necessary to proceed with endovaginal exam following the
transabdominal exam to visualize the uterus and ovaries. Color and
duplex Doppler ultrasound was utilized to evaluate blood flow to the
ovaries.

[Series 1: us transvaginal non-ob · 0.22mm/px · 13 of 54 slices shown]
[im 1/54]
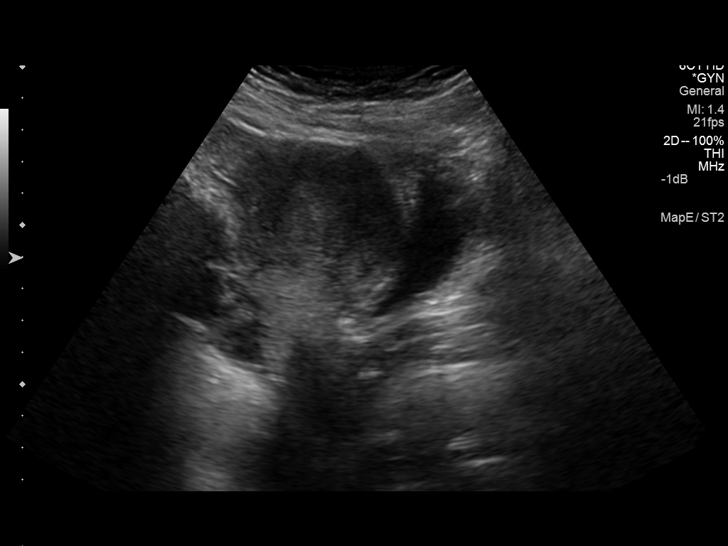
[im 5/54]
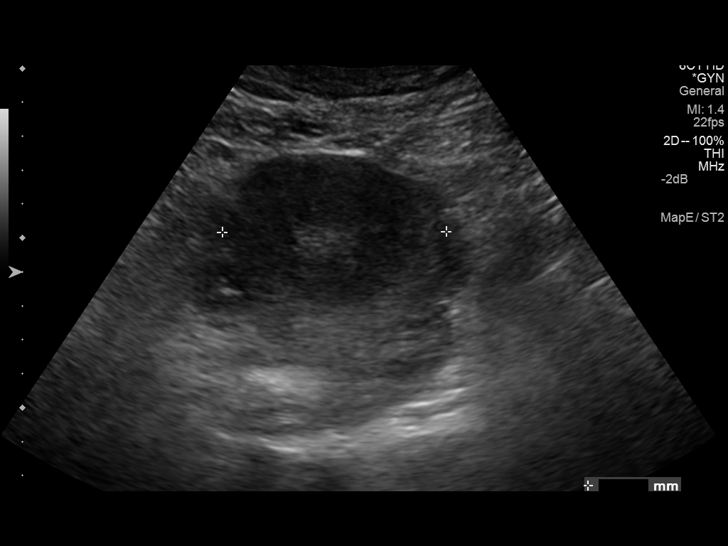
[im 9/54]
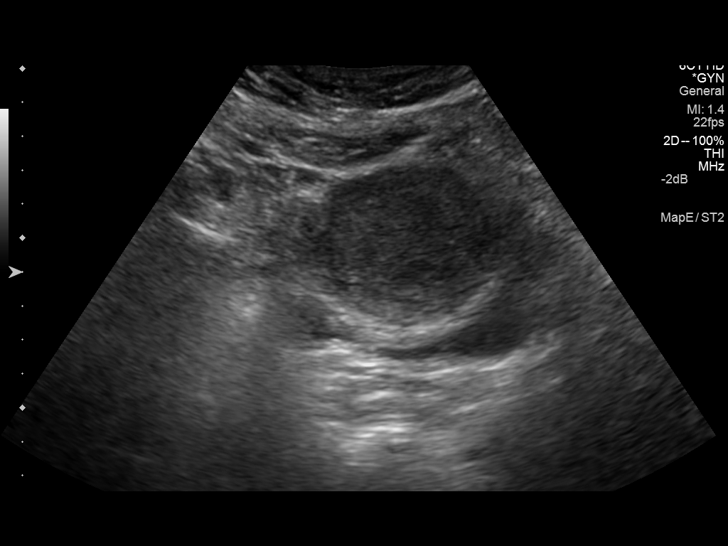
[im 14/54]
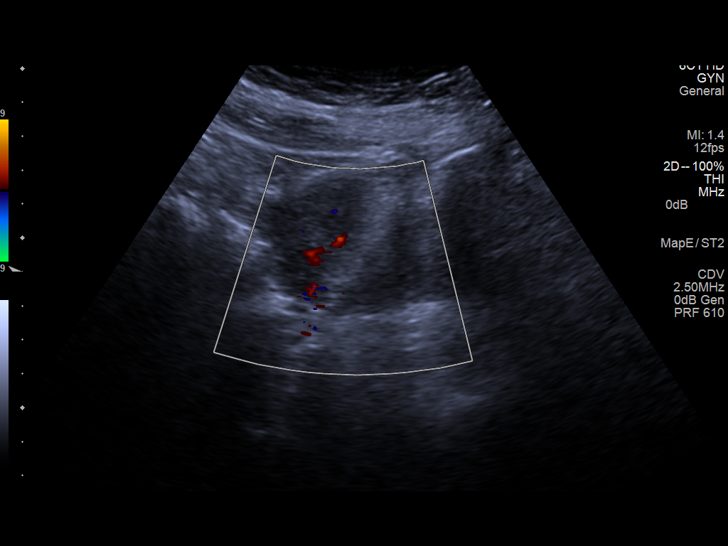
[im 18/54]
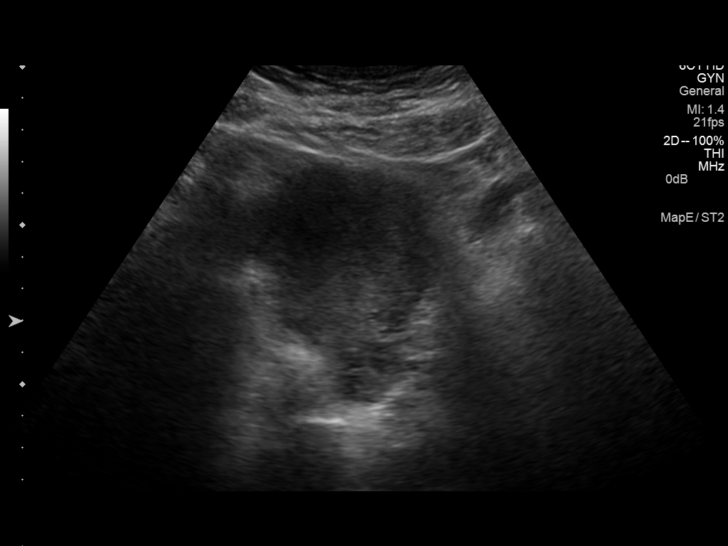
[im 23/54]
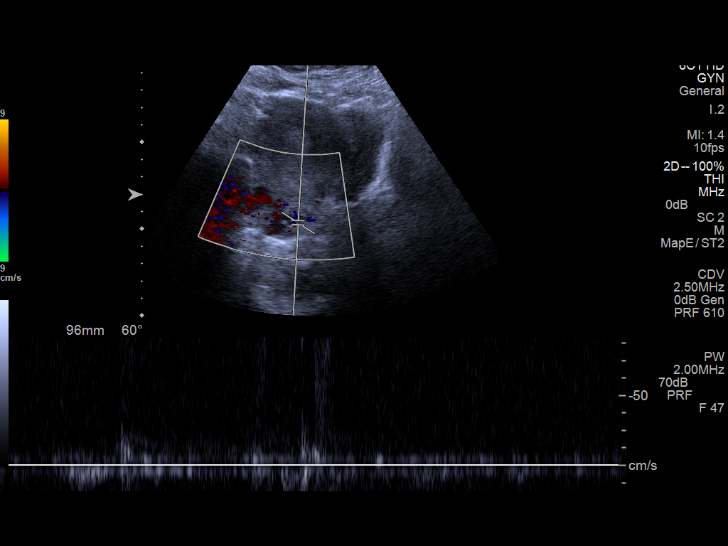
[im 27/54]
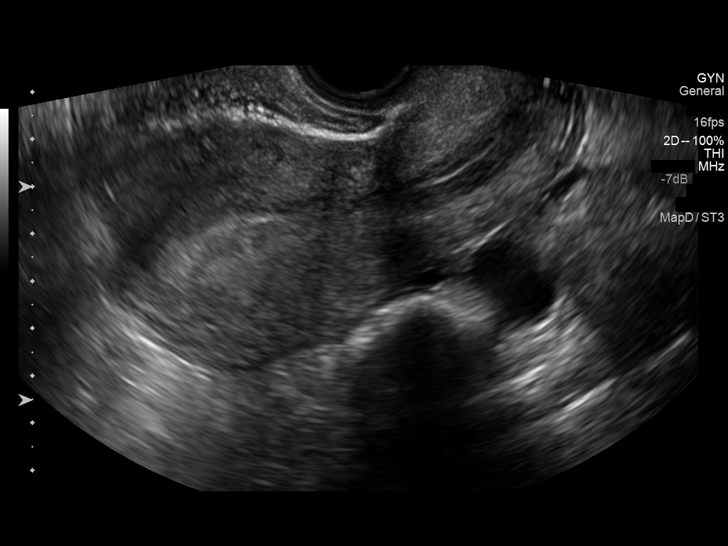
[im 31/54]
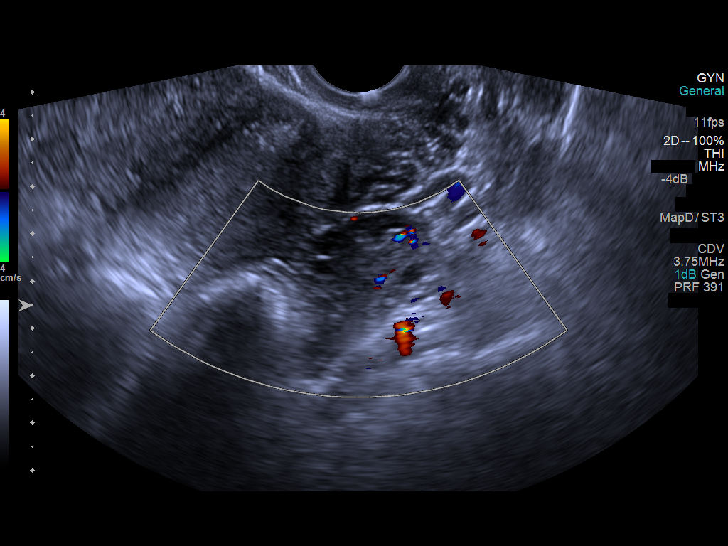
[im 36/54]
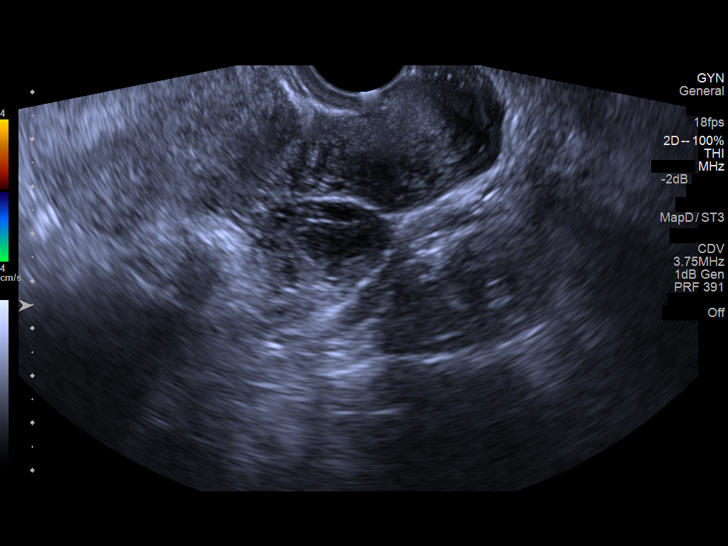
[im 40/54]
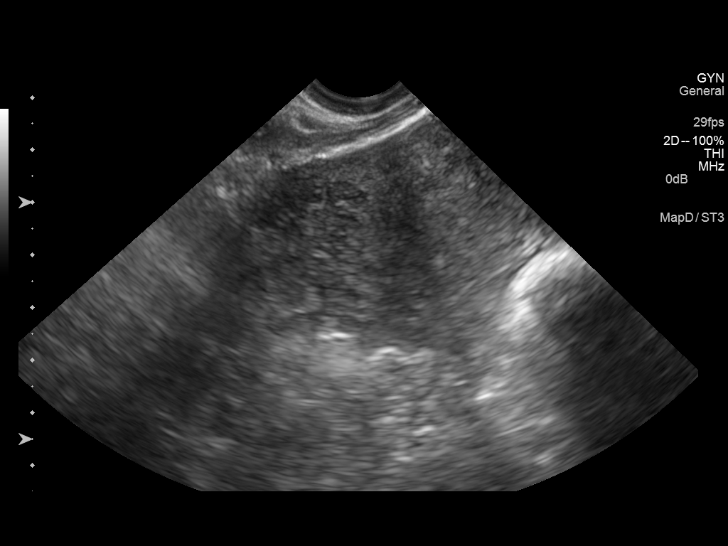
[im 45/54]
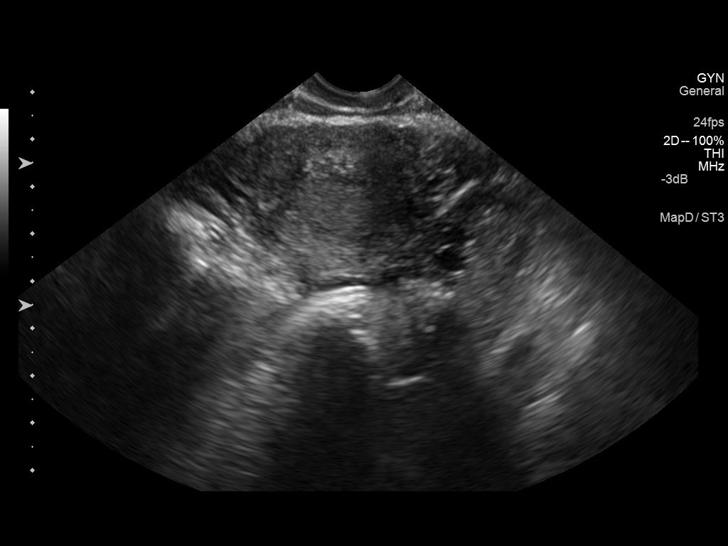
[im 49/54]
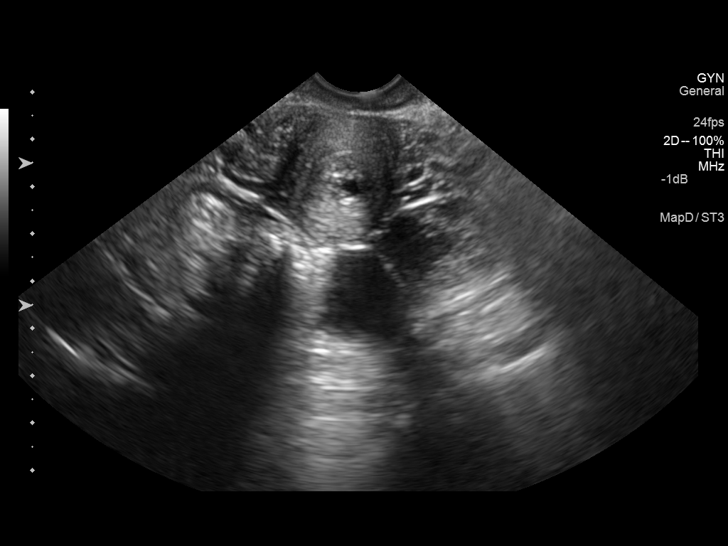
[im 54/54]
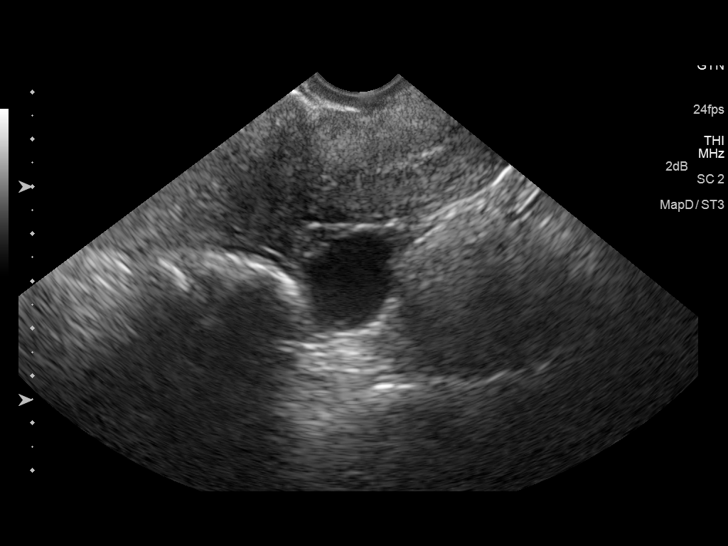

[13 of 25 positions shown; findings below may reference images not displayed]

FINDINGS: Uterus

Measurements: Normal in size at 10.0 x 5.0 x 6.4 cm..

Endometrium

Thickness: Normal in thickness for premenopausal female at 13 mm..

Right ovary

Measurements: Normal in size at 3.1 x 2.4 x 3.5 cm. Normal color
Doppler flow and spectral waveforms.

Left ovary

Measurements: Normal in size at 2.9 x 3.1 x 2.6 cm. There is a
dominant follicle measuring 2.2 cm. Normal color Doppler flow and
spectral waveforms..

Pulsed Doppler evaluation of both ovaries demonstrates normal
low-resistance arterial and venous waveforms.

Other findings

Small volume free fluid .
IMPRESSION: 1. Normal ovaries without evidence of torsion.
2. Normal uterus.
